# Patient Record
Sex: Female | Born: 1960
Health system: Southern US, Community
[De-identification: ages and names within clinical notes are randomized; demographics above are authoritative.]

## PROBLEM LIST (undated history)

## (undated) DIAGNOSIS — K631 Perforation of intestine (nontraumatic): Secondary | ICD-10-CM

## (undated) DIAGNOSIS — F419 Anxiety disorder, unspecified: Secondary | ICD-10-CM

## (undated) DIAGNOSIS — D352 Benign neoplasm of pituitary gland: Secondary | ICD-10-CM

## (undated) DIAGNOSIS — K219 Gastro-esophageal reflux disease without esophagitis: Secondary | ICD-10-CM

## (undated) DIAGNOSIS — K08109 Complete loss of teeth, unspecified cause, unspecified class: Secondary | ICD-10-CM

## (undated) DIAGNOSIS — E039 Hypothyroidism, unspecified: Secondary | ICD-10-CM

## (undated) DIAGNOSIS — Z973 Presence of spectacles and contact lenses: Secondary | ICD-10-CM

## (undated) DIAGNOSIS — Z8719 Personal history of other diseases of the digestive system: Secondary | ICD-10-CM

## (undated) DIAGNOSIS — M1990 Unspecified osteoarthritis, unspecified site: Secondary | ICD-10-CM

## (undated) DIAGNOSIS — R519 Headache, unspecified: Secondary | ICD-10-CM

## (undated) DIAGNOSIS — K56609 Unspecified intestinal obstruction, unspecified as to partial versus complete obstruction: Secondary | ICD-10-CM

## (undated) DIAGNOSIS — Z87442 Personal history of urinary calculi: Secondary | ICD-10-CM

## (undated) DIAGNOSIS — F32A Depression, unspecified: Secondary | ICD-10-CM

## (undated) DIAGNOSIS — K5900 Constipation, unspecified: Secondary | ICD-10-CM

## (undated) DIAGNOSIS — N12 Tubulo-interstitial nephritis, not specified as acute or chronic: Secondary | ICD-10-CM

## (undated) HISTORY — PX: CHOLECYSTECTOMY: SHX55

## (undated) HISTORY — PX: BILATERAL CARPAL TUNNEL RELEASE: SHX6508

## (undated) HISTORY — PX: THYROIDECTOMY: SHX17

## (undated) HISTORY — PX: TOTAL ABDOMINAL HYSTERECTOMY: SHX209

## (undated) HISTORY — PX: OTHER SURGICAL HISTORY: SHX169

## (undated) HISTORY — PX: TUBAL LIGATION: SHX77

## (undated) HISTORY — PX: LAPAROSCOPIC LYSIS OF ADHESIONS: SHX5905

---

## 1979-03-03 HISTORY — PX: PITUITARY SURGERY: SHX203

## 2014-03-02 HISTORY — PX: LAPAROSCOPIC GASTRIC SLEEVE RESECTION: SHX5895

## 2019-03-03 DIAGNOSIS — N200 Calculus of kidney: Secondary | ICD-10-CM

## 2019-03-03 HISTORY — DX: Calculus of kidney: N20.0

## 2020-01-30 DIAGNOSIS — E890 Postprocedural hypothyroidism: Secondary | ICD-10-CM | POA: Diagnosis not present

## 2020-01-30 DIAGNOSIS — G4700 Insomnia, unspecified: Secondary | ICD-10-CM | POA: Diagnosis not present

## 2020-01-30 DIAGNOSIS — F419 Anxiety disorder, unspecified: Secondary | ICD-10-CM | POA: Diagnosis not present

## 2020-01-30 DIAGNOSIS — F32A Depression, unspecified: Secondary | ICD-10-CM | POA: Diagnosis not present

## 2020-03-22 DIAGNOSIS — E890 Postprocedural hypothyroidism: Secondary | ICD-10-CM | POA: Diagnosis not present

## 2020-03-22 DIAGNOSIS — F419 Anxiety disorder, unspecified: Secondary | ICD-10-CM | POA: Diagnosis not present

## 2020-03-22 DIAGNOSIS — F3341 Major depressive disorder, recurrent, in partial remission: Secondary | ICD-10-CM | POA: Diagnosis not present

## 2020-03-22 DIAGNOSIS — F5104 Psychophysiologic insomnia: Secondary | ICD-10-CM | POA: Diagnosis not present

## 2020-06-19 DIAGNOSIS — L989 Disorder of the skin and subcutaneous tissue, unspecified: Secondary | ICD-10-CM | POA: Diagnosis not present

## 2020-06-19 DIAGNOSIS — Z86018 Personal history of other benign neoplasm: Secondary | ICD-10-CM | POA: Diagnosis not present

## 2020-06-19 DIAGNOSIS — Z1159 Encounter for screening for other viral diseases: Secondary | ICD-10-CM | POA: Diagnosis not present

## 2020-06-19 DIAGNOSIS — Z0000 Encounter for general adult medical examination without abnormal findings: Secondary | ICD-10-CM | POA: Diagnosis not present

## 2020-06-19 DIAGNOSIS — M810 Age-related osteoporosis without current pathological fracture: Secondary | ICD-10-CM | POA: Diagnosis not present

## 2020-06-24 ENCOUNTER — Other Ambulatory Visit: Payer: Self-pay | Admitting: Family Medicine

## 2020-06-24 DIAGNOSIS — M810 Age-related osteoporosis without current pathological fracture: Secondary | ICD-10-CM

## 2020-06-24 DIAGNOSIS — Z1231 Encounter for screening mammogram for malignant neoplasm of breast: Secondary | ICD-10-CM

## 2020-07-10 DIAGNOSIS — E538 Deficiency of other specified B group vitamins: Secondary | ICD-10-CM | POA: Diagnosis not present

## 2020-07-17 DIAGNOSIS — E538 Deficiency of other specified B group vitamins: Secondary | ICD-10-CM | POA: Diagnosis not present

## 2020-07-24 DIAGNOSIS — E538 Deficiency of other specified B group vitamins: Secondary | ICD-10-CM | POA: Diagnosis not present

## 2020-07-31 DIAGNOSIS — E538 Deficiency of other specified B group vitamins: Secondary | ICD-10-CM | POA: Diagnosis not present

## 2020-10-08 DIAGNOSIS — D485 Neoplasm of uncertain behavior of skin: Secondary | ICD-10-CM | POA: Diagnosis not present

## 2020-10-08 DIAGNOSIS — D225 Melanocytic nevi of trunk: Secondary | ICD-10-CM | POA: Diagnosis not present

## 2020-10-08 DIAGNOSIS — L570 Actinic keratosis: Secondary | ICD-10-CM | POA: Diagnosis not present

## 2020-10-08 DIAGNOSIS — L821 Other seborrheic keratosis: Secondary | ICD-10-CM | POA: Diagnosis not present

## 2020-11-21 ENCOUNTER — Other Ambulatory Visit: Payer: Self-pay | Admitting: Physician Assistant

## 2020-11-21 DIAGNOSIS — K5904 Chronic idiopathic constipation: Secondary | ICD-10-CM | POA: Diagnosis not present

## 2020-11-21 DIAGNOSIS — Z86010 Personal history of colonic polyps: Secondary | ICD-10-CM | POA: Diagnosis not present

## 2020-11-21 DIAGNOSIS — R1319 Other dysphagia: Secondary | ICD-10-CM

## 2020-11-21 DIAGNOSIS — K219 Gastro-esophageal reflux disease without esophagitis: Secondary | ICD-10-CM | POA: Diagnosis not present

## 2020-11-25 ENCOUNTER — Ambulatory Visit
Admission: RE | Admit: 2020-11-25 | Discharge: 2020-11-25 | Disposition: A | Discharge status: Home / Self Care | Payer: Medicare Other | Source: Ambulatory Visit | Attending: Physician Assistant | Admitting: Physician Assistant

## 2020-11-25 DIAGNOSIS — R1319 Other dysphagia: Secondary | ICD-10-CM

## 2020-11-25 DIAGNOSIS — K224 Dyskinesia of esophagus: Secondary | ICD-10-CM | POA: Diagnosis not present

## 2020-12-03 ENCOUNTER — Other Ambulatory Visit (HOSPITAL_COMMUNITY): Payer: Self-pay | Admitting: Physician Assistant

## 2020-12-03 DIAGNOSIS — K219 Gastro-esophageal reflux disease without esophagitis: Secondary | ICD-10-CM

## 2020-12-03 DIAGNOSIS — R1319 Other dysphagia: Secondary | ICD-10-CM

## 2020-12-03 DIAGNOSIS — K5904 Chronic idiopathic constipation: Secondary | ICD-10-CM

## 2020-12-10 ENCOUNTER — Ambulatory Visit
Admission: RE | Admit: 2020-12-10 | Discharge: 2020-12-10 | Disposition: A | Discharge status: Home / Self Care | Payer: Medicare Other | Source: Ambulatory Visit | Attending: Family Medicine | Admitting: Family Medicine

## 2020-12-10 ENCOUNTER — Other Ambulatory Visit: Payer: Self-pay

## 2020-12-10 DIAGNOSIS — M810 Age-related osteoporosis without current pathological fracture: Secondary | ICD-10-CM | POA: Diagnosis not present

## 2020-12-10 DIAGNOSIS — Z1231 Encounter for screening mammogram for malignant neoplasm of breast: Secondary | ICD-10-CM | POA: Diagnosis not present

## 2020-12-10 DIAGNOSIS — M8588 Other specified disorders of bone density and structure, other site: Secondary | ICD-10-CM | POA: Diagnosis not present

## 2020-12-12 ENCOUNTER — Ambulatory Visit (HOSPITAL_COMMUNITY)
Admission: RE | Admit: 2020-12-12 | Discharge: 2020-12-12 | Disposition: A | Discharge status: Home / Self Care | Payer: Medicare Other | Source: Ambulatory Visit | Attending: Physician Assistant | Admitting: Physician Assistant

## 2020-12-12 ENCOUNTER — Encounter (HOSPITAL_COMMUNITY): Payer: Self-pay | Admitting: Radiology

## 2020-12-12 DIAGNOSIS — R1319 Other dysphagia: Secondary | ICD-10-CM | POA: Insufficient documentation

## 2020-12-12 DIAGNOSIS — K219 Gastro-esophageal reflux disease without esophagitis: Secondary | ICD-10-CM | POA: Insufficient documentation

## 2020-12-12 DIAGNOSIS — K5900 Constipation, unspecified: Secondary | ICD-10-CM | POA: Diagnosis not present

## 2020-12-12 DIAGNOSIS — K5904 Chronic idiopathic constipation: Secondary | ICD-10-CM | POA: Diagnosis not present

## 2020-12-12 DIAGNOSIS — R140 Abdominal distension (gaseous): Secondary | ICD-10-CM | POA: Diagnosis not present

## 2020-12-12 DIAGNOSIS — R110 Nausea: Secondary | ICD-10-CM | POA: Diagnosis not present

## 2020-12-12 DIAGNOSIS — R109 Unspecified abdominal pain: Secondary | ICD-10-CM | POA: Diagnosis not present

## 2020-12-12 MED ORDER — TECHNETIUM TC 99M SULFUR COLLOID
2.0000 | Freq: Once | INTRAVENOUS | Status: AC | PRN
  Administered 2020-12-12: 2 via INTRAVENOUS

## 2020-12-16 ENCOUNTER — Other Ambulatory Visit: Payer: Self-pay | Admitting: Family Medicine

## 2020-12-16 DIAGNOSIS — K2930 Chronic superficial gastritis without bleeding: Secondary | ICD-10-CM | POA: Diagnosis not present

## 2020-12-16 DIAGNOSIS — D124 Benign neoplasm of descending colon: Secondary | ICD-10-CM | POA: Diagnosis not present

## 2020-12-16 DIAGNOSIS — D122 Benign neoplasm of ascending colon: Secondary | ICD-10-CM | POA: Diagnosis not present

## 2020-12-16 DIAGNOSIS — R12 Heartburn: Secondary | ICD-10-CM | POA: Diagnosis not present

## 2020-12-16 DIAGNOSIS — K644 Residual hemorrhoidal skin tags: Secondary | ICD-10-CM | POA: Diagnosis not present

## 2020-12-16 DIAGNOSIS — K648 Other hemorrhoids: Secondary | ICD-10-CM | POA: Diagnosis not present

## 2020-12-16 DIAGNOSIS — R1310 Dysphagia, unspecified: Secondary | ICD-10-CM | POA: Diagnosis not present

## 2020-12-16 DIAGNOSIS — Z86010 Personal history of colonic polyps: Secondary | ICD-10-CM | POA: Diagnosis not present

## 2020-12-16 DIAGNOSIS — K5730 Diverticulosis of large intestine without perforation or abscess without bleeding: Secondary | ICD-10-CM | POA: Diagnosis not present

## 2020-12-16 DIAGNOSIS — D123 Benign neoplasm of transverse colon: Secondary | ICD-10-CM | POA: Diagnosis not present

## 2020-12-16 DIAGNOSIS — R928 Other abnormal and inconclusive findings on diagnostic imaging of breast: Secondary | ICD-10-CM

## 2020-12-16 DIAGNOSIS — Z9884 Bariatric surgery status: Secondary | ICD-10-CM | POA: Diagnosis not present

## 2020-12-16 DIAGNOSIS — K2980 Duodenitis without bleeding: Secondary | ICD-10-CM | POA: Diagnosis not present

## 2020-12-19 DIAGNOSIS — F3341 Major depressive disorder, recurrent, in partial remission: Secondary | ICD-10-CM | POA: Diagnosis not present

## 2020-12-19 DIAGNOSIS — F419 Anxiety disorder, unspecified: Secondary | ICD-10-CM | POA: Diagnosis not present

## 2020-12-19 DIAGNOSIS — Z23 Encounter for immunization: Secondary | ICD-10-CM | POA: Diagnosis not present

## 2020-12-19 DIAGNOSIS — E890 Postprocedural hypothyroidism: Secondary | ICD-10-CM | POA: Diagnosis not present

## 2020-12-20 DIAGNOSIS — K2980 Duodenitis without bleeding: Secondary | ICD-10-CM | POA: Diagnosis not present

## 2020-12-20 DIAGNOSIS — D123 Benign neoplasm of transverse colon: Secondary | ICD-10-CM | POA: Diagnosis not present

## 2020-12-20 DIAGNOSIS — D122 Benign neoplasm of ascending colon: Secondary | ICD-10-CM | POA: Diagnosis not present

## 2020-12-20 DIAGNOSIS — K2930 Chronic superficial gastritis without bleeding: Secondary | ICD-10-CM | POA: Diagnosis not present

## 2021-01-01 ENCOUNTER — Other Ambulatory Visit: Payer: Self-pay | Admitting: Family Medicine

## 2021-01-01 ENCOUNTER — Other Ambulatory Visit: Payer: Self-pay

## 2021-01-01 ENCOUNTER — Ambulatory Visit
Admission: RE | Admit: 2021-01-01 | Discharge: 2021-01-01 | Disposition: A | Discharge status: Home / Self Care | Payer: Medicare Other | Source: Ambulatory Visit | Attending: Family Medicine | Admitting: Family Medicine

## 2021-01-01 DIAGNOSIS — R928 Other abnormal and inconclusive findings on diagnostic imaging of breast: Secondary | ICD-10-CM | POA: Diagnosis not present

## 2021-01-01 DIAGNOSIS — N6489 Other specified disorders of breast: Secondary | ICD-10-CM | POA: Diagnosis not present

## 2021-01-29 DIAGNOSIS — Z9884 Bariatric surgery status: Secondary | ICD-10-CM | POA: Diagnosis not present

## 2021-01-29 DIAGNOSIS — K219 Gastro-esophageal reflux disease without esophagitis: Secondary | ICD-10-CM | POA: Diagnosis not present

## 2021-02-20 DIAGNOSIS — E890 Postprocedural hypothyroidism: Secondary | ICD-10-CM | POA: Diagnosis not present

## 2021-02-20 DIAGNOSIS — F5104 Psychophysiologic insomnia: Secondary | ICD-10-CM | POA: Diagnosis not present

## 2021-02-20 DIAGNOSIS — F3341 Major depressive disorder, recurrent, in partial remission: Secondary | ICD-10-CM | POA: Diagnosis not present

## 2021-02-20 DIAGNOSIS — F419 Anxiety disorder, unspecified: Secondary | ICD-10-CM | POA: Diagnosis not present

## 2021-03-05 DIAGNOSIS — K219 Gastro-esophageal reflux disease without esophagitis: Secondary | ICD-10-CM | POA: Diagnosis not present

## 2021-03-10 DIAGNOSIS — H524 Presbyopia: Secondary | ICD-10-CM | POA: Diagnosis not present

## 2021-04-03 DIAGNOSIS — F410 Panic disorder [episodic paroxysmal anxiety] without agoraphobia: Secondary | ICD-10-CM | POA: Diagnosis not present

## 2021-04-03 DIAGNOSIS — F3340 Major depressive disorder, recurrent, in remission, unspecified: Secondary | ICD-10-CM | POA: Diagnosis not present

## 2021-04-03 DIAGNOSIS — F411 Generalized anxiety disorder: Secondary | ICD-10-CM | POA: Diagnosis not present

## 2021-04-07 DIAGNOSIS — F411 Generalized anxiety disorder: Secondary | ICD-10-CM | POA: Diagnosis not present

## 2021-04-07 DIAGNOSIS — F410 Panic disorder [episodic paroxysmal anxiety] without agoraphobia: Secondary | ICD-10-CM | POA: Diagnosis not present

## 2021-04-07 DIAGNOSIS — F3340 Major depressive disorder, recurrent, in remission, unspecified: Secondary | ICD-10-CM | POA: Diagnosis not present

## 2021-04-29 DIAGNOSIS — F410 Panic disorder [episodic paroxysmal anxiety] without agoraphobia: Secondary | ICD-10-CM | POA: Diagnosis not present

## 2021-04-29 DIAGNOSIS — F3340 Major depressive disorder, recurrent, in remission, unspecified: Secondary | ICD-10-CM | POA: Diagnosis not present

## 2021-04-29 DIAGNOSIS — F411 Generalized anxiety disorder: Secondary | ICD-10-CM | POA: Diagnosis not present

## 2021-05-01 DIAGNOSIS — F411 Generalized anxiety disorder: Secondary | ICD-10-CM | POA: Diagnosis not present

## 2021-05-01 DIAGNOSIS — F3340 Major depressive disorder, recurrent, in remission, unspecified: Secondary | ICD-10-CM | POA: Diagnosis not present

## 2021-05-01 DIAGNOSIS — F410 Panic disorder [episodic paroxysmal anxiety] without agoraphobia: Secondary | ICD-10-CM | POA: Diagnosis not present

## 2021-05-14 DIAGNOSIS — F411 Generalized anxiety disorder: Secondary | ICD-10-CM | POA: Diagnosis not present

## 2021-05-14 DIAGNOSIS — F3340 Major depressive disorder, recurrent, in remission, unspecified: Secondary | ICD-10-CM | POA: Diagnosis not present

## 2021-05-20 DIAGNOSIS — F3340 Major depressive disorder, recurrent, in remission, unspecified: Secondary | ICD-10-CM | POA: Diagnosis not present

## 2021-05-20 DIAGNOSIS — F411 Generalized anxiety disorder: Secondary | ICD-10-CM | POA: Diagnosis not present

## 2021-05-20 DIAGNOSIS — F410 Panic disorder [episodic paroxysmal anxiety] without agoraphobia: Secondary | ICD-10-CM | POA: Diagnosis not present

## 2021-05-28 DIAGNOSIS — F411 Generalized anxiety disorder: Secondary | ICD-10-CM | POA: Diagnosis not present

## 2021-05-28 DIAGNOSIS — F3340 Major depressive disorder, recurrent, in remission, unspecified: Secondary | ICD-10-CM | POA: Diagnosis not present

## 2021-05-28 DIAGNOSIS — F410 Panic disorder [episodic paroxysmal anxiety] without agoraphobia: Secondary | ICD-10-CM | POA: Diagnosis not present

## 2021-06-16 DIAGNOSIS — F411 Generalized anxiety disorder: Secondary | ICD-10-CM | POA: Diagnosis not present

## 2021-06-16 DIAGNOSIS — F3340 Major depressive disorder, recurrent, in remission, unspecified: Secondary | ICD-10-CM | POA: Diagnosis not present

## 2021-06-16 DIAGNOSIS — F410 Panic disorder [episodic paroxysmal anxiety] without agoraphobia: Secondary | ICD-10-CM | POA: Diagnosis not present

## 2021-06-27 DIAGNOSIS — Z79899 Other long term (current) drug therapy: Secondary | ICD-10-CM | POA: Diagnosis not present

## 2021-06-27 DIAGNOSIS — Z90710 Acquired absence of both cervix and uterus: Secondary | ICD-10-CM | POA: Diagnosis not present

## 2021-06-27 DIAGNOSIS — E538 Deficiency of other specified B group vitamins: Secondary | ICD-10-CM | POA: Diagnosis not present

## 2021-06-27 DIAGNOSIS — Z90721 Acquired absence of ovaries, unilateral: Secondary | ICD-10-CM | POA: Diagnosis not present

## 2021-06-27 DIAGNOSIS — Z0000 Encounter for general adult medical examination without abnormal findings: Secondary | ICD-10-CM | POA: Diagnosis not present

## 2021-06-27 DIAGNOSIS — E890 Postprocedural hypothyroidism: Secondary | ICD-10-CM | POA: Diagnosis not present

## 2021-06-27 DIAGNOSIS — E7800 Pure hypercholesterolemia, unspecified: Secondary | ICD-10-CM | POA: Diagnosis not present

## 2021-07-03 ENCOUNTER — Other Ambulatory Visit: Payer: Self-pay | Admitting: Gastroenterology

## 2021-07-03 DIAGNOSIS — K5900 Constipation, unspecified: Secondary | ICD-10-CM | POA: Diagnosis not present

## 2021-07-03 DIAGNOSIS — K219 Gastro-esophageal reflux disease without esophagitis: Secondary | ICD-10-CM | POA: Diagnosis not present

## 2021-07-03 DIAGNOSIS — K224 Dyskinesia of esophagus: Secondary | ICD-10-CM | POA: Diagnosis not present

## 2021-07-03 DIAGNOSIS — K589 Irritable bowel syndrome without diarrhea: Secondary | ICD-10-CM | POA: Diagnosis not present

## 2021-07-07 DIAGNOSIS — F410 Panic disorder [episodic paroxysmal anxiety] without agoraphobia: Secondary | ICD-10-CM | POA: Diagnosis not present

## 2021-07-07 DIAGNOSIS — F3340 Major depressive disorder, recurrent, in remission, unspecified: Secondary | ICD-10-CM | POA: Diagnosis not present

## 2021-07-07 DIAGNOSIS — F411 Generalized anxiety disorder: Secondary | ICD-10-CM | POA: Diagnosis not present

## 2021-07-18 DIAGNOSIS — F3340 Major depressive disorder, recurrent, in remission, unspecified: Secondary | ICD-10-CM | POA: Diagnosis not present

## 2021-07-18 DIAGNOSIS — F411 Generalized anxiety disorder: Secondary | ICD-10-CM | POA: Diagnosis not present

## 2021-07-18 DIAGNOSIS — F410 Panic disorder [episodic paroxysmal anxiety] without agoraphobia: Secondary | ICD-10-CM | POA: Diagnosis not present

## 2021-07-21 DIAGNOSIS — F3340 Major depressive disorder, recurrent, in remission, unspecified: Secondary | ICD-10-CM | POA: Diagnosis not present

## 2021-07-21 DIAGNOSIS — F411 Generalized anxiety disorder: Secondary | ICD-10-CM | POA: Diagnosis not present

## 2021-07-21 DIAGNOSIS — F410 Panic disorder [episodic paroxysmal anxiety] without agoraphobia: Secondary | ICD-10-CM | POA: Diagnosis not present

## 2021-08-11 DIAGNOSIS — F411 Generalized anxiety disorder: Secondary | ICD-10-CM | POA: Diagnosis not present

## 2021-08-11 DIAGNOSIS — F3340 Major depressive disorder, recurrent, in remission, unspecified: Secondary | ICD-10-CM | POA: Diagnosis not present

## 2021-08-11 DIAGNOSIS — F410 Panic disorder [episodic paroxysmal anxiety] without agoraphobia: Secondary | ICD-10-CM | POA: Diagnosis not present

## 2021-08-22 DIAGNOSIS — J0100 Acute maxillary sinusitis, unspecified: Secondary | ICD-10-CM | POA: Diagnosis not present

## 2021-08-22 DIAGNOSIS — J069 Acute upper respiratory infection, unspecified: Secondary | ICD-10-CM | POA: Diagnosis not present

## 2021-08-26 DIAGNOSIS — K5904 Chronic idiopathic constipation: Secondary | ICD-10-CM | POA: Diagnosis not present

## 2021-08-26 DIAGNOSIS — K589 Irritable bowel syndrome without diarrhea: Secondary | ICD-10-CM | POA: Diagnosis not present

## 2021-08-27 DIAGNOSIS — F410 Panic disorder [episodic paroxysmal anxiety] without agoraphobia: Secondary | ICD-10-CM | POA: Diagnosis not present

## 2021-08-27 DIAGNOSIS — F411 Generalized anxiety disorder: Secondary | ICD-10-CM | POA: Diagnosis not present

## 2021-08-27 DIAGNOSIS — F3340 Major depressive disorder, recurrent, in remission, unspecified: Secondary | ICD-10-CM | POA: Diagnosis not present

## 2021-09-11 DIAGNOSIS — F411 Generalized anxiety disorder: Secondary | ICD-10-CM | POA: Diagnosis not present

## 2021-09-11 DIAGNOSIS — F3340 Major depressive disorder, recurrent, in remission, unspecified: Secondary | ICD-10-CM | POA: Diagnosis not present

## 2021-10-13 ENCOUNTER — Encounter: Payer: Self-pay | Admitting: Behavioral Health

## 2021-10-13 ENCOUNTER — Ambulatory Visit (INDEPENDENT_AMBULATORY_CARE_PROVIDER_SITE_OTHER): Payer: Medicare Other | Admitting: Behavioral Health

## 2021-10-13 VITALS — BP 134/87 | HR 77 | Ht 59.0 in | Wt 117.0 lb

## 2021-10-13 DIAGNOSIS — F5105 Insomnia due to other mental disorder: Secondary | ICD-10-CM

## 2021-10-13 DIAGNOSIS — F331 Major depressive disorder, recurrent, moderate: Secondary | ICD-10-CM

## 2021-10-13 DIAGNOSIS — F411 Generalized anxiety disorder: Secondary | ICD-10-CM

## 2021-10-13 DIAGNOSIS — F99 Mental disorder, not otherwise specified: Secondary | ICD-10-CM | POA: Diagnosis not present

## 2021-10-13 MED ORDER — FLUOXETINE HCL 20 MG PO CAPS: 80.0000 mg | ORAL_CAPSULE | Freq: Every day | ORAL | 1 refills | Status: DC

## 2021-10-13 MED ORDER — QUETIAPINE FUMARATE 25 MG PO TABS: 50.0000 mg | ORAL_TABLET | Freq: Every evening | ORAL | 1 refills | Status: DC

## 2021-10-13 MED ORDER — TEMAZEPAM 30 MG PO CAPS: 30.0000 mg | ORAL_CAPSULE | Freq: Every day | ORAL | 1 refills | Status: DC

## 2021-10-13 MED ORDER — ALPRAZOLAM 0.5 MG PO TABS: 0.5000 mg | ORAL_TABLET | Freq: Two times a day (BID) | ORAL | 1 refills | Status: DC | PRN

## 2021-10-13 NOTE — Progress Notes (Signed)
[facility] MD/PA/NP Initial Note  10/13/2021 5:32 PM Valerie [name]  MRN:  [id number]  Chief Complaint:  Chief Complaint   Anxiety; Depression; Medication Problem; Medication Refill; Establish Care; Patient Education     HPI:   "Valerie [name]", 61 year old female presents to this office for initial visit and to establish care.  She is accompanied by her husband Reggie with her verbal consent.  She says that she has struggled with anxiety and depression for over 20 years.  She is looking for a new provider to manage her medications.  She said that she was being candid with me, but she was dismissed from [facility] over how certain Rx medications were handled and protocol.  She said she and her husband moved to the area from [place] to be closer to their son who is working in the area.  She is currently living in [place] in an apartment and says that she has having a difficult time adapting to the area.  [place]  Says that she and her husband and rest the family have had a very difficult life dealing with serious disease and illness to include cancer.  She currently struggles with irritable bowel syndrome.  She said that her current medication regimen is currently working well for her and she does not want to change at this time.  She endorses frequent irritability and talkativeness.  She says that her anxiety today is 6 of 10, and her depression is 3 of 10.  She is sleeping 7 8 hours every night with the aid of Restoril.  She denies any history of mania, no psychosis, no history of auditory or visual hallucinations.  She denies SI or HI.  Prior psychiatric medication trial: Prozac Xanax Temazepam    Visit Diagnosis:    ICD-10-CM   1. Generalized anxiety disorder  F41.1 ALPRAZolam (XANAX) 0.5 MG tablet    FLUoxetine (PROZAC) 20 MG capsule    QUEtiapine (SEROQUEL) 25 MG tablet    2. Major depressive disorder, recurrent episode, moderate (HCC)  F33.1  FLUoxetine (PROZAC) 20 MG capsule    3. Insomnia due to other mental disorder  F51.05 temazepam (RESTORIL) 30 MG capsule   F99 QUEtiapine (SEROQUEL) 25 MG tablet      Past Psychiatric History: Anxiety, MDD  Past Medical History: History reviewed. No pertinent past medical history. History reviewed. No pertinent surgical history.  Family Psychiatric History: none noted. Will provide next visit  Family History: History reviewed. No pertinent family history.  Social History:  Social History   Socioeconomic History   Marital status: Married    Spouse name: Not on file   Number of children: Not on file   Years of education: Not on file   Highest education level: Not on file  Occupational History   Not on file  Tobacco Use   Smoking status: Not on file   Smokeless tobacco: Not on file  Substance and Sexual Activity   Alcohol use: Not on file   Drug use: Not on file   Sexual activity: Not on file  Other Topics Concern   Not on file  Social History Narrative   Not on file   Social Determinants of Health   Financial Resource Strain: Not on file  Food Insecurity: Not on file  Transportation Needs: Not on file  Physical Activity: Not on file  Stress: Not on file  Social Connections: Not on file    Allergies: No Known Allergies  Metabolic Disorder Labs:  No results found for: "HGBA1C", "MPG" No results found for: "PROLACTIN" No results found for: "CHOL", "TRIG", "HDL", "CHOLHDL", "VLDL", "LDLCALC" No results found for: "TSH"  Therapeutic Level Labs: No results found for: "LITHIUM" No results found for: "VALPROATE" No results found for: "CBMZ"  Current Medications: Current Outpatient Medications  Medication Sig Dispense Refill   ALPRAZolam (XANAX) 0.5 MG tablet Take 1 tablet (0.5 mg total) by mouth 2 (two) times daily as needed. 180 tablet 1   FLUoxetine (PROZAC) 20 MG capsule Take 4 capsules (80 mg total) by mouth daily. 120 capsule 1   levothyroxine (SYNTHROID)  75 MCG tablet Take by mouth.     LINZESS 290 MCG CAPS capsule Take 290 mcg by mouth every morning.     omeprazole (PRILOSEC) 40 MG capsule Take 40 mg by mouth every morning.     QUEtiapine (SEROQUEL) 25 MG tablet Take 2 tablets (50 mg total) by mouth at bedtime. 180 tablet 1   temazepam (RESTORIL) 30 MG capsule Take 1 capsule (30 mg total) by mouth at bedtime. 90 capsule 1   No current facility-administered medications for this visit.    Medication Side Effects: none  Orders placed this visit:  No orders of the defined types were placed in this encounter.   Psychiatric Specialty Exam:  Review of Systems  HENT:  Positive for trouble swallowing.   Gastrointestinal:  Positive for constipation and diarrhea.  Neurological:  Positive for weakness.    There were no vitals taken for this visit.There is no height or weight on file to calculate BMI.  General Appearance: Casual, Neat, and Well Groomed  Eye Contact:  Good  Speech:  Clear and Coherent and Talkative  Volume:  Normal  Mood:  Anxious and Depressed  Affect:  Depressed and Anxious  Thought Process:  Coherent  Orientation:  Full (Time, Place, and Person)  Thought Content: Logical   Suicidal Thoughts:  No  Homicidal Thoughts:  No  Memory:  WNL  Judgement:  Good  Insight:  Good  Psychomotor Activity:  Normal  Concentration:  Concentration: Good  Recall:  Good  Fund of Knowledge: Good  Language: Good  Assets:  Desire for Improvement  ADL's:  Intact  Cognition: WNL  Prognosis:  Good   Screenings:  PHQ2-9    Flowsheet Row Office Visit from 10/13/2021 in [facility]  PHQ-2 Total Score 1       Receiving Psychotherapy: No   Treatment Plan/Recommendations: Greater than 50% of  60 min face to face time with patient was spent on counseling and coordination of care. We discussed her long hx of anxiety and depression stemming back over 20 years. We talked about her previous tx and poc. We discussed  medications and her long term bz use. With her being on medication so long I feel that 1 mg per day is reasonable amount without additional risk a this time.  I reinforced that I did not feel comfortable increasing this amount nor would I ever approve early fills. She verbally acknowledged and understood. No other medication changes were necessary at this time.  We agreed to; Will continue Xanax 0.5 mg twice daily as needed for severe anxiety To continue Prozac 80 mg daily.  Patient is taking four 20 mg tablets To continue Seroquel 50 mg at bedtime.  Patient is taking two 25 mg tablets at once. To continue temazepam 30 mg capsule at bedtime for sleep. We will report side effects or worsening symptoms promptly Provided emergency contact information  We will follow-up in 3 months as required to continue medications. Discussed potential benefits, risk, and side effects of benzodiazepines to include potential risk of tolerance and dependence, as well as possible drowsiness.  Advised patient not to drive if experiencing drowsiness and to take lowest possible effective dose to minimize risk of dependence and tolerance.  Discussed potential metabolic side effects associated with atypical antipsychotics, as well as potential risk for movement side effects. Advised pt to contact office if movement side effects occur.   Reviewed PDMP    Joan Flores, NP

## 2021-10-14 ENCOUNTER — Ambulatory Visit: Payer: Medicare Other | Admitting: Adult Health

## 2021-10-16 ENCOUNTER — Ambulatory Visit: Payer: Medicare Other | Admitting: Psychiatry

## 2021-10-16 DIAGNOSIS — F411 Generalized anxiety disorder: Secondary | ICD-10-CM | POA: Diagnosis not present

## 2021-10-16 NOTE — Progress Notes (Signed)
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." "Anxiety produces too much uncertainty not know things for certain."  Mental Status Exam:    Appearance:   Casual     Behavior:  Appropriate, Sharing, and Motivated  Motor:  Normal  Speech/Language:   Clear and Coherent  Affect:  anxious  Mood:  anxious  Thought process:  goal directed  Thought content:    Obsessions  Sensory/Perceptual disturbances:    WNL  Orientation:  oriented to person, place, time/date, situation, day of week, month of year, year, and stated date of October 16, 2021  Attention:  Good  Concentration:  Good  Memory:  WNL  Fund of knowledge:   Good  Insight:    Good  Judgment:   Good  Impulse Control:  Good and Fair   Reported Symptoms:  see above symptoms  Risk Assessment: Danger to Self:  No Self-injurious Behavior: No Danger to Others: No Duty to Warn:no Physical Aggression / Violence:No  Access to Firearms a concern: No  Gang Involvement:No  Patient / guardian was educated about steps to take if suicide or homicide risk level increases between visits: Denies any SI. While future psychiatric events cannot be accurately predicted, the patient does not currently require acute inpatient psychiatric care and does not currently meet [facility] involuntary commitment criteria.  Substance Abuse History: Current substance abuse: No     Past Psychiatric History:   Previous psychological history is significant for anxiety and depression Outpatient Providers:[facility]. "Other before that" History of Psych Hospitalization: No  Psychological Testing:  n/a    Abuse History: Victim of  No.,  n/a    Report needed: No. Victim of Neglect:No. Perpetrator of  n/a   Witness / Exposure to Domestic Violence: No   Protective Services Involvement: No  Witness to [employer] Violence:  No   Family History:  Father is deceased, mom is still living. Has 2 sisters (living in [place] and [place]) both of which  are "smarter and more financially stable than I am." "I was the one who is rebellious, they are compliant." "Not close to either of them."  Living situation: the patient lives with their spouse, married for 43 yrs. She is retired and husband works Armed forces operational officer.  Has 2 adult kids, ages 31 and 47, both males State she is close to older son but not so much with younger son, with 1 living very close to patient and the other one is in [place]. Has 1 grandson age 3 in [place], and a grand-daughter due within a few days here locally. Dad is deceased and mom  who lives in [place] is still living but is not close to patient nor supportive   Sexual Orientation:  Straight  Relationship Status: married  Name of spouse / other:n/a             If a parent, number of children / ages:see above  Support Systems; spouse friends  Financial Stress:   not really but it would be nice if we had more savings  Income/Employment/Disability: Social Security Disability and husband's wages  Financial planner: No   Educational History: Education: Risk manager:   Protestant  Any  cultural differences that may affect / interfere with treatment:  not applicable   Recreation/Hobbies: crafting  Stressors: son with whom she doesn't get along with; "I HAVE TO BE RIGHT, Don't tell me I'm wrong , keeping up appearances at all times, strive for perfection."  Strengths:  Supportive Relationships, Family, Friends, Spirituality, Hopefulness, Self Advocate, and Able to Communicate Effectively  Barriers:  "my family, they just won't see the better even if I accomplish it. They would only  see the past me."    Legal History: Pending legal issue / charges: The patient has no significant history of legal issues. History of legal issue / charges:  n/a  Medical History/Surgical History:None on file to review.  No past medical history on file. Approx. 1981, had brain tumor at base of brain removed.  "Has 5 small bowel obstruction and removing part of intestines."  2 C-section. Gastric-bypass sleeve surgery 8-9 yrs ago.  No past surgical history on file.  Medications: Reviewed with patient and she confirms info below.  Current Outpatient Medications  Medication Sig Dispense Refill   ALPRAZolam (XANAX) 0.5 MG tablet Take 1 tablet (0.5 mg total) by mouth 2 (two) times daily as needed. 180 tablet 1   FLUoxetine (PROZAC) 20 MG capsule Take 4 capsules (80 mg total) by mouth daily. 120 capsule 1   levothyroxine (SYNTHROID) 75 MCG tablet Take by mouth.     LINZESS 290 MCG CAPS capsule Take 290 mcg by mouth every morning.     omeprazole (PRILOSEC) 40 MG capsule Take 40 mg by mouth every morning.     QUEtiapine (SEROQUEL) 25 MG tablet Take 2 tablets (50 mg total) by mouth at bedtime. 180 tablet 1   temazepam (RESTORIL) 30 MG capsule Take 1 capsule (30 mg total) by mouth at bedtime. 90 capsule 1   No current facility-administered medications for this visit.    No Known Allergies Reports today that she has allergies to : Pennicillen, oxycontin  Diagnoses:    ICD-10-CM   1. Generalized anxiety disorder  F41.1      Treatment goal plan: Patient not signing treatment goal plan on computer screen due to Covid. Treatment goals: Treatment goals remain on treatment plan as patient works with strategies to achieve her goals. Progress is assessed each session and documented in "subject" and/or "plan" sections of treatment plan. Long-term goal: Increase understanding of beliefs and messages that produce the worry and anxiety. Short-term goal: Reduce overall level, frequency, and intensity  of the anxiety so that daily functioning is not impaired. Strategies: Patient to learn further about anxiety re: daily skills for managing it, and to use the skills effectively in everyday life.    Plan of Care: Today is first visit for this patient and we completed her initial evaluation for therapy and also her initial treatment goal plan.  Jakeia is a 61 year old, married, mother of 2 sons ages 16 and 42, 1 of whom lives about a mile from patient and the other living in [place].  She has one grandson, age 44, living in [place] and one granddaughter on the way and do within the next few days here locally.  Patient lives with her spouse and they have been married for 43 years.  She is retired and husband is retired also but works Armed forces operational officer.  States that her spouse is supportive of her along with a couple of friends.  Patient's father is deceased and her mother lives in [place].  Patient reports she and her  mother "are not close at all".  Patient has 2 sisters, 1 living in [place] and the other living in [place], and both of them are "smarter and more financially stable than I am".  Patient adds "I was the one who was rebellious, they were compliant" further adds "I was not close to either of them".  She reports little financial stress but "it would be nice if we have more savings".  Denies any current substance abuse.  Has some previous psychiatric history regarding anxiety and depression but no history of hospitalization for psychiatric purposes.  Patient denies any history of prior abuse.  Educationally, she is a Engineer, maintenance (IT).  Identifies as a Protestant in terms of religion.  States that her hobbies are crafting.  She reports her strengths to be supportive relationships (a couple), some family, a sense of spirituality, hopefulness, self advocate, and able to communicate effectively.  She reports her stressors to include: The son with whom I do not get along with, and "I have to be  right, do not tell me I am wrong, keeping up appearances all the time, and striving for perfection."  When asked about possible barriers to treatment, she stated "my family they just will not see the better even if I accomplish it, they would only see the past me."  As far as mental status exam, her appearance is casual, behavior is appropriate and motivated, motor skills are normal, speech and language is clear and coherent, affect and mood are anxious, thought process is goal directed, thought content includes obsessive thoughts, sensory/perceptual is WNL, she is well oriented to person/place/time/date/situation/day of week/month of year/year and stated date of October 16, 2021.  Her attention and concentration are good, memory is WNL, insight and judgment are good, and impulse control is "sometimes good and sometimes fair".  Found it hard to articulate her goals but eventually said that she mostly wants to work on her anxiety, questioning others and their behaviors, and "some OCD".  She seems to have some motivation for these goals and states that she is committed to working together to help her "move forward and decrease her anxiety".  For further information on this patient including medical, personal history, other family history, and risk assessment, please see the above sections of this full initial evaluation.  Patient is planning to schedule within the next 2 weeks to return.  Review of initial treatment goal plan and patient is in agreement.  Next appointment within 2 weeks.  This record has been created using [employer].  Chart creation errors have been sought, but may not always have been located and corrected.  Such creation errors do not reflect on the standard of medical care provided.   Mathis Fare, LCSW

## 2021-10-30 ENCOUNTER — Ambulatory Visit: Payer: Medicare Other | Admitting: Psychiatry

## 2021-10-30 DIAGNOSIS — F411 Generalized anxiety disorder: Secondary | ICD-10-CM

## 2021-10-30 NOTE — Progress Notes (Signed)
[facility] Counselor/Therapist Progress Note  Patient ID: Valerie [name], MRN: [id number],    Date: 10/30/2021  Time Spent: 55 minutes   Treatment Type: Individual Therapy  Reported Symptoms: anxiety, worry excessive  Mental Status Exam:  Appearance:   Neat     Behavior:  Appropriate, Sharing, and Motivated  Motor:  Normal  Speech/Language:   Clear and Coherent  Affect:  Anxious, worried  Mood:  anxious  Thought process:  goal directed  Thought content:    Rumination and overthinking  Sensory/Perceptual disturbances:    WNL  Orientation:  oriented to person, place, time/date, situation, day of week, month of year, year, and stated date of October 30, 2021  Attention:  Good  Concentration:  Good and Fair  Memory:  WNL  Fund of knowledge:   Good  Insight:    Good  Judgment:   Good  Impulse Control:  Good and Fair   Risk Assessment: Danger to Self:  No Self-injurious Behavior: No Danger to Others: No Duty to Warn:no Physical Aggression / Violence:No  Access to Firearms a concern: No  Gang Involvement:No   Subjective:  Patient in today reporting symptoms of "anxiety, overly questioning others, OCD, very poor boundaries that is heavily impacting her mood and outlook, and frustrated because anxiety produces too much uncertainty". Today states "I want to get some direction to not let others consume me as I am a people pleaser and I take on their problems." Needed session today to work on relationship with one of her long time friends that is unhealthy for patient. Worked on setting limits, clear boundaries, saying "no" when she needs to do so, and gaining more awareness in sessions as we discuss healthy friendships and the need to take care of her own self. Also caught up in some similar issues with her mother and sister that we processed today. Taught patient about healthy boundaries as this is a significant need for patient within her family and beyond.  Interventions:  Cognitive Behavioral Therapy and Ego-Supportive  Treatment goal plan: Patient not signing treatment goal plan on computer screen due to Covid. Treatment goals: Treatment goals remain on treatment plan as patient works with strategies to achieve her goals. Progress is assessed each session and documented in "subject" and/or "plan" sections of treatment plan. Long-term goal: Increase understanding of beliefs and messages that produce the worry and anxiety. Short-term goal: Reduce overall level, frequency, and intensity of the anxiety so that daily functioning is not impaired. Strategies: Patient to learn further about anxiety re: daily skills for managing it, and to use the skills effectively in everyday life.   Diagnosis:   ICD-10-CM   1. Generalized anxiety disorder  F41.1      Plan:  Patient in today showing motivation and participation in session as she focused on personal and family issues that involve very unhealthy boundaries. Very difficult to make changes and finds it hard to consider other behaviors and responses to family members (especially mom and sister).  Patient did however commit to homework assignment relating to unhealthy boundaries and family issues.  Is making some progress in terms of becoming a little more committed to actually trying to make some changes and believing that maybe she can, but also continues to need to be in therapy to build on that initial progress and follow goal directed behaviors to become less anxious, less controlled by other people, have healthier boundaries, and be more stable emotionally. Encouraged patient in the practice of  more positive behaviors including: Staying in the present focusing on what she can control or change, have more compassion for herself and others, understand that her thoughts are "just thoughts" and are not necessarily going to play out in reality, stay in touch with people who are supportive, look for more positives versus  negatives each day, healthy nutrition and exercise, refrain from assuming negatives and worst case scenarios, use of positive self talk, reduce overthinking and over analyzing, challenge and counteract any self doubt, and recognize the strength she shows working with goal directed behaviors to move in a direction that supports increased self-confidence and her improved emotional health.  Goal review and progress/challenges noted with patient.  Next appointment within 2 to 3 weeks.  This record has been created using [employer].  Chart creation errors have been sought, but may not always have been located and corrected.  Such creation errors do not reflect on the standard of medical care provided.   Mathis Fare, LCSW

## 2021-11-05 ENCOUNTER — Encounter (HOSPITAL_COMMUNITY): Payer: Self-pay

## 2021-11-05 ENCOUNTER — Ambulatory Visit (HOSPITAL_COMMUNITY): Admit: 2021-11-05 | Payer: Medicare Other | Admitting: Gastroenterology

## 2021-11-05 SURGERY — MANOMETRY, ANORECTAL

## 2021-11-07 ENCOUNTER — Ambulatory Visit: Payer: Medicare Other | Admitting: Psychiatry

## 2021-11-10 ENCOUNTER — Ambulatory Visit (INDEPENDENT_AMBULATORY_CARE_PROVIDER_SITE_OTHER): Payer: Medicare Other | Admitting: Psychiatry

## 2021-11-10 DIAGNOSIS — F411 Generalized anxiety disorder: Secondary | ICD-10-CM | POA: Diagnosis not present

## 2021-11-10 NOTE — Progress Notes (Signed)
[facility] Counselor/Therapist Progress Note  Patient ID: Valerie [name], MRN: [id number],    Date: 11/10/2021  Time Spent: 55 minutes   Treatment Type: Individual Therapy  Reported Symptoms: anxiety, self-doubt, motivated currently but it does change at times, some issues in relationshp with husband   Mental Status Exam:  Appearance:   Casual and Neat     Behavior:  Appropriate, Sharing, and Motivated  Motor:  Normal  Speech/Language:   Clear and Coherent  Affect:  anxiety  Mood:  anxious  Thought process:  goal directed  Thought content:    Rumination and overthinking  Sensory/Perceptual disturbances:    WNL  Orientation:  oriented to person, place, time/date, situation, day of week, month of year, year, and stated date of Sept. 11, 2023  Attention:  Good  Concentration:  Good  Memory:  WNL  Fund of knowledge:   Good  Insight:    Good and Fair  Judgment:   Good  Impulse Control:  Good   Risk Assessment: Danger to Self:  No Self-injurious Behavior: No Danger to Others: No Duty to Warn:no Physical Aggression / Violence:No  Access to Firearms a concern: No  Gang Involvement:No   Subjective:   Patient in today reporting anxiety, self-doubt, and currently motivated but "my motivation does change at times." "Being here makes me feel more at peace" but also acknowledging feelings outside of here that she has been dealing since last appt. These feelings related to her communication within her marriage which she reports can be quite volatile. Also processing some of her OCD thoughts and behaviors. Boundary issues continue with family and beyond in some friendships as she worked further today on her tendency to be a people-pleaser. Some difficulty in seeing some of her own behaviors that feed relationship issues within family and beyond. Feeling "less than" at times and very high expectations. "I feel that I feel through the cracks in my younger years including being promoted  through school and passed on to next grade level, but didn't really master the material. Lack of fulfillment in her life. Hard to see positives/possibilities. "Raised with negativity," and worked with this realization today in session, gradually trying to decide on a "positive" for herself.  Homework given to patient re:the impact negativity has had in her life and how she wants to turn things around now to include recognition of some positives, setting limits with the negative impact she allows other to be.  Interventions: Cognitive Behavioral Therapy and Ego-Supportive  Treatment goal plan: Patient not signing treatment goal plan on computer screen due to Covid. Treatment goals: Treatment goals remain on treatment plan as patient works with strategies to achieve her goals. Progress is assessed each session and documented in "subject" and/or "plan" sections of treatment plan. Long-term goal: Increase understanding of beliefs and messages that produce the worry and anxiety. Short-term goal: Reduce overall level, frequency, and intensity of the anxiety so that daily functioning is not impaired. Strategies: Patient to learn further about anxiety re: daily skills for managing it, and to use the skills effectively in everyday life.   Diagnosis:   ICD-10-CM   1. Generalized anxiety disorder  F41.1      Plan: Patient in today showing active participation and good motivation is session today focusing on family and personal issues including patient feeling hurt, mimimized, unappreciated, not acceptable. "But I feel at times I am more than what they see, and try to be more than this."  Especially difficult  feelings with mom and sister.Patient did however commit to homework assignment relating to unhealthy boundaries and family issues, and actually seemed more motivated by end of session as we talked about this.  Making some progress as she is committed to coming to therapy and also today actually allowed  herself to look at things in a little different perspectiv, which felt like progress for her.  Trying to focus more with her on what she can change versus cannot change and living in herself more to begin some changing, which could lead to her feeling less controlled by others and also less anxious. Encouraged patient in her practice of more positive behaviors including: Remaining in the present focusing on what she can control or change, have more compassion for herself and others, understand that her thoughts are "just thoughts" and not necessarily going to play out in reality, stay in touch with people who are supportive, look for more positives versus negatives each day, healthy nutrition and exercise, refrain from assuming negatives and worst case scenarios, use of positive self talk, reduce overthinking and over analyzing, challenge and counteract her self doubt, and realize the strength she shows working with goal directed behaviors to move in a direction that supports her improved emotional health.  Goal review and progress/challenges noted with patient.  Next appointment within 2 to 3 weeks.  This record has been created using [employer].  Chart creation errors have been sought, but may not always have been located and corrected.  Such creation errors do not reflect on the standard of medical care provided.   Mathis Fare, LCSW

## 2021-11-19 DIAGNOSIS — K219 Gastro-esophageal reflux disease without esophagitis: Secondary | ICD-10-CM | POA: Diagnosis not present

## 2021-11-19 DIAGNOSIS — K5904 Chronic idiopathic constipation: Secondary | ICD-10-CM | POA: Diagnosis not present

## 2021-11-19 DIAGNOSIS — K3184 Gastroparesis: Secondary | ICD-10-CM | POA: Diagnosis not present

## 2021-11-26 ENCOUNTER — Ambulatory Visit (INDEPENDENT_AMBULATORY_CARE_PROVIDER_SITE_OTHER): Payer: Medicare Other | Admitting: Psychiatry

## 2021-11-26 DIAGNOSIS — F411 Generalized anxiety disorder: Secondary | ICD-10-CM

## 2021-11-26 NOTE — Progress Notes (Signed)
[facility] Counselor/Therapist Progress Note  Patient ID: Valerie [name], MRN: [id number],    Date: 11/26/2021  Time Spent: 55 minutes   Treatment Type: Individual Therapy  Reported Symptoms:  anxiety, depression "and lack of enthusiasm sometimes"  Mental Status Exam:  Appearance:   Casual and Neat     Behavior:  Appropriate, Sharing, and Motivated (some times motivation is less)  Motor:  Normal  Speech/Language:   Clear and Coherent  Affect:  Depressed and anxious  Mood:  anxious and depressed  Thought process:  goal directed  Thought content:    Rumination and some overthinking  Sensory/Perceptual disturbances:    WNL  Orientation:  oriented to person, place, time/date, situation, day of week, month of year, year, and stated date of Sept 27, 2023  Attention:  Good  Concentration:  Good  Memory:  WNL  Fund of knowledge:   Good  Insight:    Good and Fair  Judgment:   Good  Impulse Control:  Good and Fair   Risk Assessment: Danger to Self:  No Self-injurious Behavior: No Danger to Others: No Duty to Warn:no Physical Aggression / Violence:No  Access to Firearms a concern: No  Gang Involvement:No   Subjective:  Patient today reporting anxiety increased some recently, self doubt, some lack of enthusiasm at times, "I look for approval and praise and husband is not one to do that." He works 3 days per week. "I figured out my husband creates most of my anxiety." States husband is the "love of my life but also the thorn in my side." Trying to figure out how to better interact in relationship with him. Communication between the 2 of them can be volatile and unproductive, sometimes hurtful to patient. Feels there is strong imbalance in their relationship and husband not committed to working on changes. "I want to stop looking for approval and praise from him." Worked further on her people-pleasing tendencies today. Discussed boundary issues and having healthier boundaries and  also calmer demeanor, particularly within family (son and daughter-in-law) per the way patient explained how interactions with him typically go.  Has difficulty in seeing how some of her behaviors can feed relationship issues within the family.  Relates it to her educational experience and that "I felt that I fell through the cracks in my younger years and was promoted through school and passed on to the next grade level without really knowing the material I was to learn."  Processed more of her feeling she has missed out on a lot of fulfillment in her life and it is harder for her to see the positives versus the negatives.  Working to let go of more negativity which she shares again that "I was raised with negativity".  To continue some journaling between sessions and especially on the issues of how thoughts change her life to include being able to see positives, and having some control and setting limits with the negatives "which I have allowed from others".  Interventions: Cognitive Behavioral Therapy and Ego-Supportive  Treatment goal plan: Patient not signing treatment goal plan on computer screen due to Covid. Treatment goals: Treatment goals remain on treatment plan as patient works with strategies to achieve her goals. Progress is assessed each session and documented in "subject" and/or "plan" sections of treatment plan. Long-term goal: Increase understanding of beliefs and messages that produce the worry and anxiety. Short-term goal: Reduce overall level, frequency, and intensity of the anxiety so that daily functioning is not impaired.  Strategies: Patient to learn further about anxiety re: daily skills for managing it, and to use the skills effectively in everyday life.   Diagnosis:   ICD-10-CM   1. Generalized anxiety disorder  F41.1      Plan:  Patient in today showing good motivation and active participation in session focusing more on her relationship issues within the family  particularly husband and son and daughter-in-law.  Worked on more self awareness, nonverbal communication as well as verbal communication, being able to "read" situations more accurately, and reducing "People-pleasing" behaviors.  Also worked on some boundary issues and ways to set and maintain healthy boundaries and being aware of her own behavior and relationships.  Relates some of this to her past history which is significant for her relationship issues, her communication issues and her own self awareness.  Patient is motivated and does make efforts in her goal directed behaviors.  It seems to help in demonstrating or sharing very specific examples for her as we discussed behaviors which may be helpful to her and other behaviors to work on letting go of.  Better understanding some of her relationship issues as she shared more about relationships and dynamics in her family history.  Emphasizing more what she can change versus cannot change, and also her desire to feel less anxious and less controlled by others. Encouraged patient and practicing more positive behaviors including: Having more compassion for herself and others, understand that her thoughts are "just thoughts" and are not necessarily going to play out in reality, stay in the present focusing on what she can control or change, remain in touch with people who are supportive, look for more positives versus negatives each day, healthy nutrition and exercise, refrain from assuming negatives and worst case scenarios, use of positive self talk, reduce overthinking and over analyzing, challenge and counteract her self doubt, and recognize the strengths she shows working with goal directed behaviors to move in a direction that supports her improved emotional health and overall outlook.  Goal review and progress/challenges noted with patient.  Next appointment within 3 weeks.  This record has been created using [employer].  Chart creation errors have  been sought, but may not always have been located and corrected.  Such creation errors do not reflect on the standard of medical care provided.   Mathis Fare, LCSW

## 2021-11-28 ENCOUNTER — Other Ambulatory Visit: Payer: Self-pay | Admitting: Family Medicine

## 2021-11-28 DIAGNOSIS — Z1231 Encounter for screening mammogram for malignant neoplasm of breast: Secondary | ICD-10-CM

## 2021-12-10 ENCOUNTER — Ambulatory Visit (INDEPENDENT_AMBULATORY_CARE_PROVIDER_SITE_OTHER): Payer: Self-pay | Admitting: Psychiatry

## 2021-12-10 DIAGNOSIS — F411 Generalized anxiety disorder: Secondary | ICD-10-CM

## 2021-12-10 NOTE — Progress Notes (Signed)
Patient ID: Valerie [name], female   DOB: [birth year], 61 y.o.   MRN: [id number]  Patient no-showed for appt today and did not call to cancel.

## 2021-12-17 ENCOUNTER — Ambulatory Visit: Payer: Medicare Other | Admitting: Psychiatry

## 2021-12-17 DIAGNOSIS — F411 Generalized anxiety disorder: Secondary | ICD-10-CM

## 2021-12-17 NOTE — Progress Notes (Signed)
[facility] Counselor/Therapist Progress Note  Patient ID: Valerie [name], MRN: [id number],    Date: 12/17/2021  Time Spent: 55 minutes   Treatment Type: Individual Therapy  Reported Symptoms: anxiety, nervousness, difficulty with things being out of her control or unknown  Mental Status Exam:  Appearance:   Neat     Behavior:  Appropriate, Sharing, and Motivated  Motor:  Normal  Speech/Language:   Clear and Coherent  Affect:  anxious  Mood:  anxious  Thought process:  goal directed  Thought content:    WNL  Sensory/Perceptual disturbances:    WNL  Orientation:  oriented to person, place, time/date, situation, day of week, month of year, year, and stated date of Oct. 18, 2023  Attention:  Fair  Concentration:  Fair  Memory:  WNL  Fund of knowledge:   Good  Insight:    Good and Fair  Judgment:   Good and Fair  Impulse Control:  Good and Fair   Risk Assessment: Danger to Self:  No Self-injurious Behavior: No Danger to Others: No Duty to Warn:no Physical Aggression / Violence:No  Access to Firearms a concern: No  Gang Involvement:No   Subjective:  Patient in today reporting anxiety, feeling passive, lack of enthusiasm, self doubt. Looks for approval from husband and he does not typically offer praise to me or others. Feels husband "is the reason for a lot of my anxiety." "He is the love of y life and also the thorn in my side." States in her marriage "he rules the roost". "His behavior gives away his unhappiness and he is very difficult to deal with.States husband and she "went to marital therapy but husband is very Copy and he fooled the therapist." Feels she is "stuck there and has no place to go." Very frustrated and states she is stuck and there's nothing she can do.  Supported her venting her frustrations.  Encourage patient to try to view her situation in different ways and see some options for her to make changes that may contribute to improvement in her  relationship with husband.  Also explored some ways of speaking with has been that he might actually hear her and it possibly make a difference.  There does seem, per her report, to be strong imbalances in their relationship in communicating and decision making.  States she never gets approval and I will praise from him and wants to start having that within herself which we did further explore today.  Also have encouraged her to do some journaling between now and her next session.  Is struggling with the fact that "I was raised with negativity and have continue this and my marital relationship".  She is very much against staying in her marriage.  Interventions: Cognitive Behavioral Therapy and Ego-Supportive   Treatment goal plan: Patient not signing treatment goal plan on computer screen due to Covid. Treatment goals: Treatment goals remain on treatment plan as patient works with strategies to achieve her goals. Progress is assessed each session and documented in "subject" and/or "plan" sections of treatment plan. Long-term goal: Increase understanding of beliefs and messages that produce the worry and anxiety. Short-term goal: Reduce overall level, frequency, and intensity of the anxiety so that daily functioning is not impaired. Strategies: Patient to learn further about anxiety re: daily skills for managing it, and to use the skills effectively in everyday life.   Diagnosis:   ICD-10-CM   1. Generalized anxiety disorder  F41.1  Plan: Patient today showing active participation and good motivation in session working on reported symptoms of anxiety, frustrations, feeling passive in relationships, lack of enthusiasm, and self-doubt.  As we talked it became clear that her most important concerns today were difficulties in relationship with her husband.  They have reportedly been in marital counseling before but "it definitely was not helpful as reportedly husband would not go in and be  truthful".  Continues to have a lot of issues with him which she worked on some today as well as looking at some changes and options for her that might create more clarity and sense of purpose which she would like to have and would not be dependent on husband being involved.  Has a strong history of relationship difficulties and does show some insight into this in sessions. Encouraged her more in looking for what she can change that would be helpful versus cannot change.  She is very clear that she would not leave the marriage but she does want to generate some happiness and connections for herself.  She is to do some journaling between sessions and we will pick back up on this when she returns for her next appointment. Encouraged patient in her practice of more positive behaviors including: Understand that her thoughts are "just thoughts" and are not necessarily going to play out in reality, having more compassion for herself and others, stay in the present focusing on what she can control her change, remain in touch with people who are supportive, look for more positives versus negatives each day, healthy nutrition and exercise, refrain from assuming negatives and worst-case scenarios, use of positive self talk, reduce overthinking and overanalyzing, challenging counteract her self-doubt, and realize the strength she shows working with goal-directed behaviors to move in a direction that supports her improved emotional health and  Goal review and progress/challenges noted with patient.  Next appointment within 2 to 3 weeks.  This record has been created using [employer].  Chart creation errors have been sought, but may not always have been located and corrected.  Such creation errors do not reflect on the standard of medical care provided.   Shanon Ace, LCSW

## 2021-12-22 DIAGNOSIS — E890 Postprocedural hypothyroidism: Secondary | ICD-10-CM | POA: Diagnosis not present

## 2021-12-24 DIAGNOSIS — E538 Deficiency of other specified B group vitamins: Secondary | ICD-10-CM | POA: Diagnosis not present

## 2021-12-24 DIAGNOSIS — E890 Postprocedural hypothyroidism: Secondary | ICD-10-CM | POA: Diagnosis not present

## 2021-12-24 DIAGNOSIS — F3341 Major depressive disorder, recurrent, in partial remission: Secondary | ICD-10-CM | POA: Diagnosis not present

## 2021-12-31 ENCOUNTER — Ambulatory Visit: Payer: Medicare Other | Admitting: Psychiatry

## 2022-01-02 ENCOUNTER — Ambulatory Visit: Payer: Medicare Other

## 2022-01-07 ENCOUNTER — Ambulatory Visit: Payer: Medicare Other | Admitting: Psychiatry

## 2022-01-12 ENCOUNTER — Encounter: Payer: Self-pay | Admitting: Psychiatry

## 2022-01-12 ENCOUNTER — Ambulatory Visit (INDEPENDENT_AMBULATORY_CARE_PROVIDER_SITE_OTHER): Payer: Medicare Other | Admitting: Psychiatry

## 2022-01-12 DIAGNOSIS — F411 Generalized anxiety disorder: Secondary | ICD-10-CM | POA: Diagnosis not present

## 2022-01-12 NOTE — Progress Notes (Signed)
[facility] Counselor/Therapist Progress Note  Patient ID: Valerie [name], MRN: [id number],    Date: 01/12/2022  Time Spent: 50 minutes   Treatment Type: Individual Therapy  Reported Symptoms: anxiety,depression, less "enthusiastic" in daily tasks and hobbies  Mental Status Exam:  Appearance:   Neat     Behavior:  Appropriate, Sharing, and having some difficulty motivating herself  Motor:  Normal  Speech/Language:   Clear and Coherent  Affect:  Anxious, some depression  Mood:  anxious and depressed  Thought process:  goal directed  Thought content:    Obsessions (thoughts and behavior)  Sensory/Perceptual disturbances:    WNL  Orientation:  oriented to person, place, time/date, situation, day of week, month of year, year, and stated date of Nov. 13, 2023  Attention:  Good  Concentration:  Good  Memory:  WNL  Fund of knowledge:   Good  Insight:    Good  Judgment:   Good  Impulse Control:  Good and Fair   Risk Assessment: Danger to Self:  No Self-injurious Behavior: No Danger to Others: No Duty to Warn:no Physical Aggression / Violence:No  Access to Firearms a concern: No  Gang Involvement:No   Subjective: Patient in today reporting anxiety and depression, lack of enthusiasm, and self doubt. Some improvement in "trying to not look for approval from husband so much" and "I want to to feel better and stronger about myself including my decisions from within and not based just on what other think or makes them happy and I end up not being fulfilled." Processed this more in session today as is "definietly contributes to her anxiety and depression. Would like to feel "trustworthy that what I'm doing is right but not movitated by how I please others or not."  "I come from a tumultuous family background" and patient explained this in more detail today and how it has impacted me over the years and even now."  "I've never felt good enough". Worked on this more in session today as  she is sharing newer info that further explains her history and its impact on patient. Wanting to feel more "stabilization, less guilt about not being around family as much." Tension between patient and mother-in-law. Some violence/impulsivity/overdrinking with some in family and acted out on it in inappropriately as patient shared several examples. Looking for approval from husband and sees him as the reason for a lot of her anxiety.Difficult for patient to try new behaviors and to try any letting go within husband's family relationships. Wants to "feel better" about herself and being able to move forward but also struggles with changing in ways that she may need to change in order to make progress. Realizing more the "broken family husband was raised in and sees him acting the same way now with me". But is saying today she wants to stay in her marriage (different from last visit). Homework re: her behavioral and verbal responses within family and will pick up on this next session.  Interventions: Cognitive Behavioral Therapy, Solution-Oriented/Positive Psychology, and Ego-Supportive  Long-term goal: Increase understanding of beliefs and messages that produce the worry and anxiety. Short-term goal: Reduce overall level, frequency, and intensity of the anxiety so that daily functioning is not impaired. Strategies: Patient to learn further about anxiety re: daily skills for managing it, and to use the skills effectively in everyday life.   Diagnosis:   ICD-10-CM   1. Generalized anxiety disorder  F41.1      Plan:  Patient in  today showing good participation and motivation in session as she focused more on family dynamics and the ways that she feels certain family dynamics and relationships contribute to her anxiety and depression and "always lead me in a negative direction."  Reports always feeling that she was just a "victim" to it and could never do anything about it.  However today responded in a  more active way to therapy interventions including some new ways of looking at old situations she reports has held her back for years.  Motivation seem to increase over the course of session and we talked about ways she can help keep that going in between sessions by being involved in some more positive activities, letting herself think more positively, and connections with people who are more helpful for her.  To follow-up on therapy homework regarding behavioral and verbal responses within family, and will process that more in session.   Encouraged patient in her practice of more positive behaviors as noted in session including: Stay in the present focusing on what she can control or change, remain in touch with people who are supportive, look for more positives versus negatives daily, healthy nutrition and exercise, refrain from assuming negatives and worst-case scenarios, positive self talk, reduce overthinking and overanalyzing, challenge and counteract her self-doubt, understand that her thoughts are "just thoughts" and are not necessarily going to play out in reality the way she might listen, having more compassion for herself and others, and recognize the strength she shows working with goal-directed behaviors to move in a direction that supports her improved emotional health.  *To review tobacco history with patient next session.  Goal review and progress/challenges noted with patient.  Next appointment within 2 to 3 weeks.  This record has been created using [employer].  Chart creation errors have been sought, but may not always have been located and corrected.  Such creation errors do not reflect on the standard of medical care provided.   Mathis Fare, LCSW

## 2022-01-13 DIAGNOSIS — R0789 Other chest pain: Secondary | ICD-10-CM | POA: Diagnosis not present

## 2022-01-14 ENCOUNTER — Ambulatory Visit: Payer: Medicare Other | Admitting: Behavioral Health

## 2022-01-14 ENCOUNTER — Encounter: Payer: Self-pay | Admitting: Behavioral Health

## 2022-01-14 DIAGNOSIS — F411 Generalized anxiety disorder: Secondary | ICD-10-CM | POA: Diagnosis not present

## 2022-01-14 MED ORDER — ALPRAZOLAM 0.5 MG PO TABS: 0.5000 mg | ORAL_TABLET | Freq: Two times a day (BID) | ORAL | 0 refills | Status: DC | PRN

## 2022-01-14 NOTE — Progress Notes (Signed)
[facility] Check  Patient ID: Valerie [name],  MRN: [id number]  PCP: Valerie Beams, MD  Date of Evaluation: 01/14/2022 Time spent:30 minutes  Chief Complaint:  Chief Complaint   Anxiety; Follow-up; Medication Refill; Patient Education; Family Problem     HISTORY/CURRENT STATUS: HPI  "Valerie [name]", 61 year old female presents to this office for follow up and medication management.  Says that her medication regimen continues to be fine. She would like to focus more in her therapy sessions about family dynamics and feeling inadequate or unvalidated. She continues to see Rockne Menghini for therapy.  She says that her anxiety today is 3 of 10, and her depression is 2 of 10.  She is sleeping 7 8 hours every night with the aid of Restoril.  She denies any history of mania, no psychosis, no history of auditory or visual hallucinations.  She denies SI or HI.   Prior psychiatric medication trial: Prozac Xanax Temazepam     Individual Medical History/ Review of Systems: Changes? :No   Allergies: Patient has no known allergies.  Current Medications:  Current Outpatient Medications:    ALPRAZolam (XANAX) 0.5 MG tablet, Take 1 tablet (0.5 mg total) by mouth 2 (two) times daily as needed., Disp: 180 tablet, Rfl: 0   FLUoxetine (PROZAC) 20 MG capsule, Take 4 capsules (80 mg total) by mouth daily., Disp: 120 capsule, Rfl: 1   levothyroxine (SYNTHROID) 75 MCG tablet, Take by mouth., Disp: , Rfl:    LINZESS 290 MCG CAPS capsule, Take 290 mcg by mouth every morning., Disp: , Rfl:    omeprazole (PRILOSEC) 40 MG capsule, Take 40 mg by mouth every morning., Disp: , Rfl:    QUEtiapine (SEROQUEL) 25 MG tablet, Take 2 tablets (50 mg total) by mouth at bedtime., Disp: 180 tablet, Rfl: 1   temazepam (RESTORIL) 30 MG capsule, Take 1 capsule (30 mg total) by mouth at bedtime., Disp: 90 capsule, Rfl: 1 Medication Side Effects: none  Family Medical/ Social History: Changes? No  MENTAL HEALTH  EXAM:  There were no vitals taken for this visit.There is no height or weight on file to calculate BMI.  General Appearance: Casual  Eye Contact:  Good  Speech:  Clear and Coherent  Volume:  Normal  Mood:  NA  Affect:  Appropriate  Thought Process:  Coherent  Orientation:  Full (Time, Place, and Person)  Thought Content: Logical   Suicidal Thoughts:  No  Homicidal Thoughts:  No  Memory:  WNL  Judgement:  Fair  Insight:  Good  Psychomotor Activity:  Normal  Concentration:  Concentration: Good  Recall:  Good  Fund of Knowledge: Good  Language: Good  Assets:  Desire for Improvement  ADL's:  Intact  Cognition: WNL  Prognosis:  Good    DIAGNOSES:    ICD-10-CM   1. Generalized anxiety disorder  F41.1 ALPRAZolam (XANAX) 0.5 MG tablet      Receiving Psychotherapy: No    RECOMMENDATIONS:   Greater than 50% of  60 min face to face time with patient was spent on counseling and coordination of care. We talked about her current stability and she does not want to make any medication changes this visit.  I reinforced that I did not feel comfortable increasing this amount nor would I ever approve early fills. She verbally acknowledged and understood. No other medication changes were necessary at this time.  We agreed to; Will continue Xanax 0.5 mg twice daily as needed for severe anxiety To continue Prozac 80 mg daily.  Patient is taking four 20 mg tablets To continue Seroquel 50 mg at bedtime.  Patient is taking two 25 mg tablets at once. To continue temazepam 30 mg capsule at bedtime for sleep. We will report side effects or worsening symptoms promptly Provided emergency contact information We will follow-up in 3 months as required to continue medications. Discussed potential benefits, risk, and side effects of benzodiazepines to include potential risk of tolerance and dependence, as well as possible drowsiness.  Advised patient not to drive if experiencing drowsiness and to take  lowest possible effective dose to minimize risk of dependence and tolerance.  Discussed potential metabolic side effects associated with atypical antipsychotics, as well as potential risk for movement side effects. Advised pt to contact office if movement side effects occur.   Reviewed PDMP  Joan Flores, NP

## 2022-01-19 ENCOUNTER — Ambulatory Visit
Admission: RE | Admit: 2022-01-19 | Discharge: 2022-01-19 | Disposition: A | Discharge status: Home / Self Care | Payer: Medicare Other | Source: Ambulatory Visit | Attending: Family Medicine | Admitting: Family Medicine

## 2022-01-19 DIAGNOSIS — Z1231 Encounter for screening mammogram for malignant neoplasm of breast: Secondary | ICD-10-CM

## 2022-01-21 DIAGNOSIS — E538 Deficiency of other specified B group vitamins: Secondary | ICD-10-CM | POA: Diagnosis not present

## 2022-01-28 ENCOUNTER — Ambulatory Visit (INDEPENDENT_AMBULATORY_CARE_PROVIDER_SITE_OTHER): Payer: Medicare Other | Admitting: Psychiatry

## 2022-01-28 ENCOUNTER — Encounter: Payer: Self-pay | Admitting: Psychiatry

## 2022-01-28 DIAGNOSIS — F411 Generalized anxiety disorder: Secondary | ICD-10-CM

## 2022-01-28 NOTE — Progress Notes (Signed)
[facility] Counselor/Therapist Progress Note  Patient ID: Valerie [name], MRN: [id number],    Date: 01/28/2022  Time Spent: 50 minutes   Treatment Type: Individual Therapy  Reported Symptoms: anxiety, anger, frustration, aggravated , irritable  Mental Status Exam:  Appearance:   Casual and Neat     Behavior:  Appropriate, Sharing, and Motivated  Motor:  Normal  Speech/Language:   Clear and Coherent  Affect:  Anxious, easily aggravated, angry  Mood:  angry, anxious, and irritable  Thought process:  goal directed  Thought content:    Obsessions and I have to be right  Sensory/Perceptual disturbances:    WNL  Orientation:  oriented to person, place, time/date, situation, day of week, month of year, year, and stated date of Nov. 29, 2023  Attention:  Good  Concentration:  Good  Memory:  Notices some short term memory issues and Dr/PA is aware  Fund of knowledge:   Good  Insight:    Good and Fair  Judgment:   Good  Impulse Control:  Good and Fair   Risk Assessment: Danger to Self:  No Self-injurious Behavior: No Danger to Others: No Duty to Warn:no Physical Aggression / Violence:No  Access to Firearms a concern: No  Gang Involvement:No   Subjective:  Patient in today reporting anxiety, depression, irritability, dealing with multiple family stressors and finding communication quite difficult. Completed tobacco questionnaire in [employer]. Discussed some of the family situations worked with last session and she picked up today to continue. Venting freely her anger, hurt and frustration. Easy to misunderstand and mis-read others and may not recognize. Spoke about how proud she is of her kids and grandkids and the "genuine love I feel from them."  "I can put up a very good front and not share my true feelings." Encouraged her not to do that in sessions and she agreed." "Feeling that I am in control and have power is important to me." Discussed some of her financial poor  decision-making that had led to where they are now and do not like it. Processed some of her regrets, frustrations, and did seem to feel heard. Also worked on healthier boundaries especially in conflictual circumstances. Needing to focus on what she can change versus cannot change. Continue homework from last session re: verbal and behavioral responses within family as practiced in session.  Interventions: Cognitive Behavioral Therapy, Solution-Oriented/Positive Psychology, and Ego-Supportive  Long-term goal: Increase understanding of beliefs and messages that produce the worry and anxiety. Short-term goal: Reduce overall level, frequency, and intensity of the anxiety so that daily functioning is not impaired. Strategies: Patient to learn further about anxiety re: daily skills for managing it, and to use the skills effectively in everyday life.   Diagnosis:   ICD-10-CM   1. Generalized anxiety disorder  F41.1      Plan:  Patient more intentionally involved ins session today which is good. Patient progressing some and felt she worked more intentionally today with strategies she can use within family setting. To continue in this direction also using goal-directed behaviors as discussed and practiced in session. Does have a hard time with accepting limits and boundaries in most settings. Trying to get out of "always being the victim" mode. Showing some increased insight and strength, step by step. Encouraged patient in practicing more positive behaviors as discussed in session including: Remain in the present focusing on what she can control her change, follow-up from therapy on feelings of inadequacy especially within the family, staying in  contact with people who are supportive, healthy nutrition and exercise, refrain from assuming negatives and worst-case scenarios, positive self talk, reduce overthinking and over analyzing, challenging counteract her self-doubt, understand that her thoughts are  "just thoughts" and are not necessarily going to play out in reality the way she might anticipate, have more compassion for herself and others, and realize the strength she shows working with goal-directed behaviors to move in a direction that supports her improved emotional health and overall wellbeing.  Goal review and progress/challenges noted with patient.  Next appointment within 2 to 3 weeks.  This record has been created using [employer].  Chart creation errors have been sought, but may not always have been located and corrected.  Such creation errors do not reflect on the standard of medical care provided.   Mathis Fare, LCSW

## 2022-02-09 ENCOUNTER — Ambulatory Visit: Payer: Medicare Other | Admitting: Psychiatry

## 2022-02-09 DIAGNOSIS — F411 Generalized anxiety disorder: Secondary | ICD-10-CM

## 2022-02-09 NOTE — Progress Notes (Signed)
[facility] Counselor/Therapist Progress Note  Patient ID: Valerie [name], MRN: [id number],    Date: 02/09/2022  Time Spent: 55 minutes   Treatment Type: Individual Therapy  Reported Symptoms: anxiety, less depression, some trouble sleeping due to cracked rib healing  Mental Status Exam:  Appearance:   Casual     Behavior:  Appropriate, Sharing, and Motivated  Motor:  Normal  Speech/Language:   Clear and Coherent  Affect:  anxious  Mood:  anxious and depression but it has decreased  Thought process:  goal directed  Thought content:    Rumination and some obsessive thoughts  Sensory/Perceptual disturbances:    WNL  Orientation:  oriented to person, place, time/date, situation, day of week, month of year, year, and stated date of Dec. 11, 2023  Attention:  Good  Concentration:  Good and Fair  Memory:  WNL  Fund of knowledge:   Good  Insight:    Good and Fair  Judgment:   Good  Impulse Control:  Good and Fair   Risk Assessment: Danger to Self:  No Self-injurious Behavior: No Danger to Others: No Duty to Warn:no Physical Aggression / Violence:No  Access to Firearms a concern: No  Gang Involvement:No   Subjective: Patient today reporting anxiety, irritability has lessened some. Depression is "better". Communication within family still a challenge.  Continued work on family situations involving communication, boundaries, always looking for "what goes wrong versus right", hurt, misunderstanding, anger, and frustration. Lots of family issues and patient wanting "to help, not hurt" the situations involved within the family. Younger adult son, she reports, has the more difficult time interacting in healthier ways within family. Patient venting her anger, hurt, and frustration and talking through specific concerns especially with the younger son and husband. Some attempts made also in some reconciliation with mom, who doesn't currently respond with patient. Recognizing her  strengths and her "weaknesses"/areas of needed growth which patient worked on more today. Struggles with guilt within family, and trying to not be "someone who always fuels the fire". Really working on relationships within the family, wanting them to be healthier but also better communicating with each other and with healthier boundaries. Trying to make better choices and setting limits more readily.   Interventions: Cognitive Behavioral Therapy, Solution-Oriented/Positive Psychology, and Ego-Supportive  Long-term goal: Increase understanding of beliefs and messages that produce the worry and anxiety. Short-term goal: Reduce overall level, frequency, and intensity of the anxiety so that daily functioning is not impaired. Strategies: Patient to learn further about anxiety re: daily skills for managing it, and to use the skills effectively in everyday life.   Diagnosis:   ICD-10-CM   1. Generalized anxiety disorder  F41.1      Plan: Patient today actively involved in session and working on issues re: anxiety, frustration, and communication issues involving the family and feeding patient's anxiety.  Worked on changes in her mindset, getting better at "not responding" when not responding as much healthier in the June in which she finds herself at times.  Was very intentionally involved today and has really picked up in her motivation and following through and strategies discussed in sessions.  Today we actually practiced some strategies using specific examples of the type of things that she encounters within the family which she especially found helpful.  Showing more determination.  Feels that she is gaining more insight with the strategies and hoping to be more consistent and trying to implement them within her family setting.  Continues  to use goal-directed behaviors to refrain from "getting in my familiar victim role".  Increased strength and insight gradually. Encouraged patient in her practice of  more positive behaviors as noted in session including: Stay in the present focusing on what she can control or change, follow-up in therapy on feelings of inadequacy especially within the family, staying in contact with people who are supportive, healthy nutrition and exercise, refrain from assuming negatives and worst-case scenarios, positive self talk, reduce her overthinking and over analyzing, challenge and counteract her self-doubt, understand that her thoughts are "just thoughts" and are not necessarily going to play out in reality the way she might anticipate, have more compassion for herself and others, and recognize the strengths she shows working with goal-directed behaviors to move in a direction that supports her improved emotional health and overall wellbeing.  Goal review and progress/challenges noted with patient.  Next appointment within 2 to 3 weeks.  This record has been created using [employer].  Chart creation errors have been sought, but may not always have been located and corrected.  Such creation errors do not reflect on the standard of medical care provided.   Mathis Fare, LCSW

## 2022-02-25 DIAGNOSIS — U071 COVID-19: Secondary | ICD-10-CM | POA: Diagnosis not present

## 2022-03-04 ENCOUNTER — Ambulatory Visit (INDEPENDENT_AMBULATORY_CARE_PROVIDER_SITE_OTHER): Payer: Medicare Other | Admitting: Psychiatry

## 2022-03-04 DIAGNOSIS — F411 Generalized anxiety disorder: Secondary | ICD-10-CM

## 2022-03-04 NOTE — Progress Notes (Signed)
[facility] Counselor/Therapist Progress Note  Patient ID: Valerie [name], MRN: [id number],    Date: 03/04/2022  Time Spent: 55 minutes      Treatment Type: Individual Therapy  Reported Symptoms:  anxiety, feeling and fearful of impending bad things, less tolerant of others; significant issues recently within family especially her mother. "I don't want to feel hostage to these feelings."   Mental Status Exam:  Appearance:   Casual     Behavior:  Appropriate, Sharing, and Motivated  Motor:  Normal  Speech/Language:   Clear and Coherent  Affect:  Anxiety, some depression, fearfulness  Mood:  anxious and some depression, fearfulness  Thought process:  goal directed  Thought content:    Obsessive thoughts  Sensory/Perceptual disturbances:    WNL  Orientation:  oriented to person, place, time/date, situation, day of week, month of year, year, and stated date of Jan. 3, 2024  Attention:  Fair  Concentration:  Good and Fair  Memory:  [place] of knowledge:   Good  Insight:    Good and Fair  Judgment:   Good  Impulse Control:  Good and Fair   Risk Assessment: Danger to Self:  No Self-injurious Behavior: No Danger to Others: No Duty to Warn:no Physical Aggression / Violence:No  Access to Firearms a concern: No  Gang Involvement:No   Subjective:  Patient today reporting anxiety, fearfulness, some depression, "impending bad things but for no reason", and less tolerant of others. The "poor me, feel sorry for me" attitude that my mother has, "I cannot deal with it." Mom says "extremely hurtful comments" to patient and patient struggling with this. "I feel less compassionate" and that bothers me "in a way" but "I also feel she was a mean, manipulative, self-absorbed very young mom" and I "feel she deserves what she's getting  and that I shouldn't have to be there for her all the time for her to feel whole."  "Lacked a lot as a child and now I'm getting stronger and not letting her  manipulate me as much, which angers my mother." Very consumed with this relationship today, which has been set off with mother's illness per patient, which has triggered her. Needed session today to focus more on this and other family relationships. "Dealing with over-dramatization in relationship with mother, age 64". Used some role-playing with patient on this and it seemed to be helpful in her understanding of self and mother.Wants to build on this more and to do some journaling "homework" between sessions. Family communication is challenging.   Interventions: Cognitive Behavioral Therapy, Solution-Oriented/Positive Psychology, and Ego-Supportive  Long-term goal: Increase understanding of beliefs and messages that produce the worry and anxiety. Short-term goal: Reduce overall level, frequency, and intensity of the anxiety so that daily functioning is not impaired. Strategies: Patient to learn further about anxiety re: daily skills for managing it, and to use the skills effectively in everyday life.   Diagnosis:   ICD-10-CM   1. Generalized anxiety disorder  F41.1      Plan: Patient today showing motivation and active participation in session working, as noted above, on personal and family relationship issues, particularly with her aging mother. "I want to feel good about myself and I do still love her but get overwhelmed and angry with her."  Focused more on boundaries, patient's needs, and changes she feels she needs to make going forward in a healthier direction for patient. Efforts to look more for positive versus negatives and worst case  scenarios. Discussed behaviors that can promote reconciliation within families. Continued struggle with guilt and wanting to be a person who does not always look for the negative nor instigating conflict within family. Realizing more her lack of control over others, but that she does have choices as to her own behavior and words. Motivation improving. Trying  to get and stay out of her traditional "victim role."Some improvement in insight. Making progress and to continue goal-directed behaviors helping with her anxiety and in developing better strategies for managing anger, decision-making, stress, anxiety, and family relationships and conflicts. Encouraged patient in practicing more positive behaviors as noted in session including: Staying in the present focusing on what she can change of control, work on her feelings of inadequacy within the family,remain in contact with people who are supportive, healthy nutrition and exercise, refrain from assuming negatives and worst-case scenarios, positive self talk, reduce her overthinking and overanalyzing, challenge and counteract self-doubt, understand that her thoughts are "just thoughts" and are not necessarily going to play out in reality the way she might anticipates, more compassion for self an dothers, and realize the strength she shows working with goal-direceted behaviors to move in a direction that supports her improved emotional health and well being.   Goal review and progress/challenges noted with patient.  Next appointment within 2 to 3 weeks.  This record has been created using [employer].  Chart creation errors have been sought, but may not always have been located and corrected.  Such creation errors do not reflect on the standard of medical care provided.   Shanon Ace, LCSW

## 2022-03-23 ENCOUNTER — Ambulatory Visit: Payer: Medicare Other | Admitting: Psychiatry

## 2022-03-23 ENCOUNTER — Ambulatory Visit (INDEPENDENT_AMBULATORY_CARE_PROVIDER_SITE_OTHER): Payer: Medicare Other | Admitting: Psychiatry

## 2022-03-23 DIAGNOSIS — F411 Generalized anxiety disorder: Secondary | ICD-10-CM

## 2022-03-23 NOTE — Progress Notes (Signed)
[facility] Counselor/Therapist Progress Note  Patient ID: Valerie [name], MRN: [id number],    Date: 03/23/2022  Time Spent: 55 minutes  Treatment Type: Individual Therapy  Reported Symptoms: anxiety "a lot of it", had recent panic attack in [employer] as I hadn't been there before and it was very crowded, feeling a bit overwhelmed with everything and dealing with significant family issues. Less overall tolerance, fears of impending bad things happening, "feeling hostage to my feelings"  Mental Status Exam:  Appearance:   Casual     Behavior:  Appropriate, Sharing, and Motivated  Motor:  Normal  Speech/Language:   Clear and Coherent  Affect:  Anxious, some depression  Mood:  anxious and some depression  Thought process:  goal directed  Thought content:    Rumination and obsessive thoughts  Sensory/Perceptual disturbances:    WNL  Orientation:  oriented to person, place, time/date, situation, day of week, month of year, year, and stated date of Jan. 22, 2024  Attention:  Good  Concentration:  Fair  Memory:  [place] of knowledge:   Good  Insight:    Good and Fair  Judgment:   Good and Fair  Impulse Control:  Fair   Risk Assessment: Danger to Self:  No Self-injurious Behavior: No Danger to Others: No Duty to Warn:no Physical Aggression / Violence:No  Access to Firearms a concern: No  Gang Involvement:No   Subjective:   Patient in today reporting anxiety, depression, less tolerant of husband dictating what I can and cannot do. Not quite as much of the "poor me" attitude that my mom has. Mother continues to make very hurtful comments which she feels definitely relates to her anxiety and depression. Also focusing today on hurt she feels by her mother. Difficult issues between patient and her aging mom continue, which patient processes in session today and this seemed to be helpful short term and patient recognizing while this is a longer term issue, per the concerns she shared  and worked on last session re: hurtfulness patient felt from her mother. Shared and discussed more of this today and states she's trying to help set healthier boundaries for herself and feels that her family "has always been dysfunctional" but better seeing it now and all of the dysfunction involved. Patient angry and trying to process these issues in therapy as she doesn't want to hold it all in and become more angry and self-centered as she gets older. Trying different ways of setting boundaries but knows "this is difficult as others in family mainly adult kids do not want her to set limits nor boundaries.   Interventions: Cognitive Behavioral Therapy and Ego-Supportive  Long-term goal: Increase understanding of beliefs and messages that produce the worry and anxiety. Short-term goal: Reduce overall level, frequency, and intensity of the anxiety so that daily functioning is not impaired. Strategies: Patient to learn further about anxiety re: daily skills for managing it, and to use the skills effectively in everyday life.    Diagnosis:   ICD-10-CM   1. Generalized anxiety disorder  F41.1      Plan:   Patient today actively participating in session and showing good motivation as she continues he work on family/relationship issues, trying to set healthy boundaries, Improving her self esteem and not allow herself to be put down repeatedly by others. Worked well on this in session today and wanting to focus on becoming a stronger more self-reliant and confident person. Difficulty untangling from her past making changes  more challenging. Very unhappy with herself and wants to change. Sees herself negatively including her looks.  Tendency to jump from 1 topic to another and we agreed that she would do some journaling between sessions this time and we will work together next session to prioritize some issues so that we can more heavily concentrate on some issues before jumping to others.  To continue  focusing on better boundaries in and out of the family.  "Do not want some much family conflict".  Does want to be out of her ongoing role of "victim" within the family and beyond.  Some gradual improvement in insight.  Needing better strategies to deal with confrontation, anger, decision making, anxiety and stress, and family relationships that are conflictual. Encouraged patient in practicing more positive behaviors as discussed in session including: Staying in the present focusing on what she can control or change, work on her feelings of inadequacy within the family, remain in contact with people who are supportive of her, healthy nutrition and exercise, refrain from assuming negatives and worst-case scenarios, positive self talk, reduce her overthinking and over analyzing, challenging counteract self-doubt, understand that her thoughts are "just thoughts" and are not necessarily going to play out in reality the way she might anticipate, more compassion for self and others, and recognize the strengths she shows working with goal-directed behaviors to move in a direction that supports her improved emotional health and outlook.  Goal review and progress/challenges noted with patient.  Next appt within 3 weeks.  This record has been created using [employer].  Chart creation errors have been sought, but may not always have been located and corrected.  Such creation errors do not reflect on the standard of medical care provided.    Shanon Ace, LCSW

## 2022-03-31 DIAGNOSIS — S299XXA Unspecified injury of thorax, initial encounter: Secondary | ICD-10-CM | POA: Diagnosis not present

## 2022-03-31 DIAGNOSIS — S2232XA Fracture of one rib, left side, initial encounter for closed fracture: Secondary | ICD-10-CM | POA: Diagnosis not present

## 2022-04-06 ENCOUNTER — Ambulatory Visit: Payer: Medicare Other | Admitting: Psychiatry

## 2022-04-07 ENCOUNTER — Ambulatory Visit (INDEPENDENT_AMBULATORY_CARE_PROVIDER_SITE_OTHER): Payer: Medicare Other | Admitting: Psychiatry

## 2022-04-07 DIAGNOSIS — F411 Generalized anxiety disorder: Secondary | ICD-10-CM

## 2022-04-07 NOTE — Progress Notes (Signed)
[facility] Counselor/Therapist Progress Note  Patient ID: Valerie [name], MRN: [id number],    Date: 04/07/2022  Time Spent: 55 minutes   Treatment Type: Individual Therapy  Reported Symptoms: anxiety, overwhelmed within family, tolerance running low, depression, frustrated, assumes worst case scenarios like "my husband is going to have a heart attack" or someone is going to attack me somewhere"  Mental Status Exam:  Appearance:   Casual     Behavior:  Appropriate, Sharing, and Motivated  Motor:  Normal  Speech/Language:   Clear and Coherent  Affect:  Anxious, depressed  Mood:  anxious and depressed  Thought process:  goal directed  Thought content:    Rumination  Sensory/Perceptual disturbances:    WNL  Orientation:  oriented to person, place, time/date, situation, day of week, month of year, year, and stated date of Feb. 6, 2024  Attention:  Fair  Concentration:  Fair  Memory:  [place] of knowledge:   Good  Insight:    Good and Fair  Judgment:   Good and Fair  Impulse Control:  Good and Fair   Risk Assessment: Danger to Self:  No Self-injurious Behavior: No Danger to Others: No Duty to Warn:no Physical Aggression / Violence:No  Access to Firearms a concern: No  Gang Involvement:No   Subjective:  Patient in today reporting anxiety, depression, low level of tolerance with other adults in family, motivation good in session but reports not so good at home,  frustrated, and assumes worst case scenarios. Being in therapy is the place where I feel heart, respected, and helped. Feels others don't care nor "sympathize with me." Doesn't like husband's trying to control things. Feeling used by son and wife. (Not all details included in this note due to patient privacy needs.) Depression and anxiety are fed by mother's hurtful comments and son's taking advantage of her. Working on healthier boundaries as discussed in session today. Needing time for herself plus physical health  is negatively impacted by certain choices. Letting other family members take advantage of her and feels she can't do anything about it. Patient explains her family has always been dysfunctional. Very difficult to even consider having healthier boundaries within family.  Considering some options and will pick up next  session.  Interventions: Cognitive Behavioral Therapy and Ego-Supportive  Long-term goal: Increase understanding of beliefs and messages that produce the worry and anxiety. Short-term goal: Reduce overall level, frequency, and intensity of the anxiety so that daily functioning is not impaired. Strategies: Patient to learn further about anxiety re: daily skills for managing it, and to use the skills effectively in everyday life.   Diagnosis:   ICD-10-CM   1. Generalized anxiety disorder  F41.1      Plan:  Patient actively participating and showing good motivation in session today as she works further on her feelings of "being taken advantage of and can't set limits", frustrated, anxious, depressed". Did have a lot of energy in talking through issues/concerns today, although currently holding onto her beliefs she "can't make changes". Explored some positives with patient and she found it hard to name any. Will pursue this more with patient next session as we ran out of time today, but patient worked consistently in session today and did well focusing on her challenges within family her fears, and how to be more assertive but "not rude" in order to be heard, understanding others may or may not respond in helpful manner. Currently feeling she can't make changes but agrees  to consider it more between sessions. Working also on self esteem which is another challenge for patient as she struggles in family relationships. Also weak with family boundaries, deflated self-esteem, wanting to be stronger but "have always doubting myself".  Gets easily "covered up" when family members talk down to her  or are offensive, and do not consider her needs.  Difficulty untangling from her past and making better decisions currently.  Long history of feeling badly about herself and not able to make healthy changes. Will continue to work on this next session and patient showing good motivation and follow-through today. Encouraged patient in practicing positive behaviors as noted in session including: Remaining in the present focusing on what she can change her control, work on her feelings of inadequacy within the family, remain in contact with supportive people, healthy nutrition and exercise, refrain from assuming negatives and worst-case scenarios, positive self talk, reduce her overthinking and over analyzing, challenge and counteract self-doubt, understand that her thoughts are "just thoughts" and are not necessarily going to play out in reality the way she might anticipate, having more compassion for self and others, not responding so quickly in conversations that are stressful and really hear what the other person is saying before responding, and realize the strength she shows working with goal-directed behaviors to move in a direction that supports her improved emotional health and overall wellbeing.  Goal review and progress/challenges noted with patient.  Next appointment within 2 to 3 weeks.  This record has been created using [employer].  Chart creation errors have been sought, but may not always have been located and corrected.  Such creation errors do not reflect on the standard of medical care provided.   Shanon Ace, LCSW

## 2022-04-13 ENCOUNTER — Ambulatory Visit (INDEPENDENT_AMBULATORY_CARE_PROVIDER_SITE_OTHER): Payer: Medicare Other | Admitting: Behavioral Health

## 2022-04-13 ENCOUNTER — Encounter: Payer: Self-pay | Admitting: Behavioral Health

## 2022-04-13 DIAGNOSIS — F5105 Insomnia due to other mental disorder: Secondary | ICD-10-CM | POA: Diagnosis not present

## 2022-04-13 DIAGNOSIS — F331 Major depressive disorder, recurrent, moderate: Secondary | ICD-10-CM | POA: Diagnosis not present

## 2022-04-13 DIAGNOSIS — F99 Mental disorder, not otherwise specified: Secondary | ICD-10-CM | POA: Diagnosis not present

## 2022-04-13 DIAGNOSIS — F411 Generalized anxiety disorder: Secondary | ICD-10-CM

## 2022-04-13 MED ORDER — QUETIAPINE FUMARATE 100 MG PO TABS
100.0000 mg | ORAL_TABLET | Freq: Every day | ORAL | 3 refills | Status: DC
Start: 2022-04-13 — End: 2022-07-13

## 2022-04-13 MED ORDER — FLUOXETINE HCL 20 MG PO CAPS: 80.0000 mg | ORAL_CAPSULE | Freq: Every day | ORAL | 3 refills | Status: DC

## 2022-04-13 NOTE — Progress Notes (Signed)
[facility] Med Check  Patient ID: Valerie [name],  MRN: [id number]  PCP: Caren Macadam, MD  Date of Evaluation: 04/13/2022 Time spent:30 minutes  Chief Complaint:  Chief Complaint   Anxiety; Medication Problem; Medication Refill; Patient Education; Follow-up; Depression     HISTORY/CURRENT STATUS: HPI  "Valerie [name]", 62 year old female presents to this office for follow up and medication management.  Says that her medication regimen continues to be fine. She is starting to have some sleeping problems again. Feels like she could use an increase of her Seroquel.  She continues to see Rinaldo Cloud for therapy.  She says that her anxiety today is 2of 10, and her depression is 2 of 10.  She is sleeping 7 8 hours every night with the aid of Restoril.  She denies any history of mania, no psychosis, no history of auditory or visual hallucinations.  She denies SI or HI.   Prior psychiatric medication trial: Prozac Xanax Temazepam     Individual Medical History/ Review of Systems: Changes? :No   Allergies: Patient has no known allergies.  Current Medications:  Current Outpatient Medications:    QUEtiapine (SEROQUEL) 100 MG tablet, Take 1 tablet (100 mg total) by mouth at bedtime., Disp: 30 tablet, Rfl: 3   ALPRAZolam (XANAX) 0.5 MG tablet, Take 1 tablet (0.5 mg total) by mouth 2 (two) times daily as needed., Disp: 180 tablet, Rfl: 0   FLUoxetine (PROZAC) 20 MG capsule, Take 4 capsules (80 mg total) by mouth daily., Disp: 120 capsule, Rfl: 3   levothyroxine (SYNTHROID) 75 MCG tablet, Take by mouth., Disp: , Rfl:    LINZESS 290 MCG CAPS capsule, Take 290 mcg by mouth every morning., Disp: , Rfl:    omeprazole (PRILOSEC) 40 MG capsule, Take 40 mg by mouth every morning., Disp: , Rfl:    QUEtiapine (SEROQUEL) 25 MG tablet, Take 2 tablets (50 mg total) by mouth at bedtime., Disp: 180 tablet, Rfl: 1   temazepam (RESTORIL) 30 MG capsule, Take 1 capsule (30 mg total) by mouth at bedtime.,  Disp: 90 capsule, Rfl: 1 Medication Side Effects: none  Family Medical/ Social History: Changes? No  MENTAL HEALTH EXAM:  There were no vitals taken for this visit.There is no height or weight on file to calculate BMI.  General Appearance: Casual, Neat, and Well Groomed  Eye Contact:  Good  Speech:  Clear and Coherent  Volume:  Normal  Mood:  Anxious and Depressed  Affect:  Appropriate  Thought Process:  Coherent  Orientation:  Full (Time, Place, and Person)  Thought Content: Logical   Suicidal Thoughts:  No  Homicidal Thoughts:  No  Memory:  WNL  Judgement:  Good  Insight:  Good  Psychomotor Activity:  Normal  Concentration:  Concentration: Good  Recall:  Good  Fund of Knowledge: Good  Language: Good  Assets:  Desire for Improvement  ADL's:  Intact  Cognition: WNL  Prognosis:  Good    DIAGNOSES:    ICD-10-CM   1. Generalized anxiety disorder  F41.1 QUEtiapine (SEROQUEL) 100 MG tablet    FLUoxetine (PROZAC) 20 MG capsule    2. Major depressive disorder, recurrent episode, moderate (HCC)  F33.1 FLUoxetine (PROZAC) 20 MG capsule    3. Insomnia due to other mental disorder  F51.05 QUEtiapine (SEROQUEL) 100 MG tablet   F99       Receiving Psychotherapy: Yes    RECOMMENDATIONS:   Greater than 50% of  30 min face to face time with patient was spent on counseling and  coordination of care. We talked about her current stability. She is still doing well but not sleeping well at night.   No other medication changes were necessary at this time.  We agreed to; Will continue Xanax 0.5 mg twice daily as needed for severe anxiety To continue Prozac 80 mg daily.  Patient is taking four 20 mg tablets To continue Seroquel 50 mg at bedtime.  Patient is taking two 25 mg tablets at once. To continue temazepam 30 mg capsule at bedtime for sleep. We will report side effects or worsening symptoms promptly Provided emergency contact information We will follow-up in 3 months as  required to continue medications. Discussed potential benefits, risk, and side effects of benzodiazepines to include potential risk of tolerance and dependence, as well as possible drowsiness.  Advised patient not to drive if experiencing drowsiness and to take lowest possible effective dose to minimize risk of dependence and tolerance.  Discussed potential metabolic side effects associated with atypical antipsychotics, as well as potential risk for movement side effects. Advised pt to contact office if movement side effects occur.   Reviewed PDMP      Elwanda [place], NP

## 2022-04-17 DIAGNOSIS — H524 Presbyopia: Secondary | ICD-10-CM | POA: Diagnosis not present

## 2022-04-20 ENCOUNTER — Ambulatory Visit: Payer: Medicare Other | Admitting: Psychiatry

## 2022-04-20 DIAGNOSIS — F411 Generalized anxiety disorder: Secondary | ICD-10-CM

## 2022-04-20 NOTE — Progress Notes (Signed)
[facility] Counselor/Therapist Progress Note  Patient ID: Valerie [name], MRN: [id number],    Date: 04/20/2022  Time Spent:  55 minutes  Treatment Type: Individual Therapy  Reported Symptoms: anxiety, depression, difficulty with being "used/manipulated"   Mental Status Exam:  Appearance:   Casual     Behavior:  Appropriate, Sharing, and Motivated  Motor:  Normal  Speech/Language:   Clear and Coherent  Affect:  anxious  Mood:  anxious  Thought process:  goal directed  Thought content:    Rumination  Sensory/Perceptual disturbances:    WNL  Orientation:  oriented to person, place, time/date, situation, day of week, month of year, year, and stated date of Feb. 19, 2024  Attention:  Good  Concentration:  Good  Memory:  WNL  Fund of knowledge:   Good  Insight:    Good and Fair  Judgment:   Good and Fair  Impulse Control:  Good and Fair   Risk Assessment: Danger to Self:  No Self-injurious Behavior: No Danger to Others: No Duty to Warn:no Physical Aggression / Violence:No  Access to Firearms a concern: No  Gang Involvement:No   Subjective: Patient in today reporting anxiety as "main symptom", some depression, and low frustration tolerance level mostly within the family. Seeing some gains as she is working to set better boundaries. Still having more challenges at home and within family relationships. Continues to often assume worst case scenarios. Is beginning to speak up more for herself. Often "get used by family members" and having difficulty saying "No" to them. Often feeling hurt and disrespected within family and is trying to limit this with better responses and healthier boundaries.  Hurt by son and wife, and husband's need to control. Also hurt by elderly mom with frequent hurtful comments, and trying to manage this better. Focusing on better communication in limit setting and boundaries.   Interventions: Cognitive Behavioral Therapy and Ego-Supportive  Long-term  goal: Increase understanding of beliefs and messages that produce the worry and anxiety. Short-term goal: Reduce overall level, frequency, and intensity of the anxiety so that daily functioning is not impaired. Strategies: Patient to learn further about anxiety re: daily skills for managing it, and to use the skills effectively in everyday life.   Diagnosis:   ICD-10-CM   1. Generalized anxiety disorder  F41.1      Plan: Patient showing good motivation and participation in session today as she worked more on setting appropriate boundaries as noted above, and being able to say "No" as needed especially within the family.  Ends up feeling more depressed, anxious, and frustrated. Working hard to form healthier boundaries going forward. Does seem to realize some of the personal growth she is needing to work on further to eventually be healthier and happier.  Has made progress and needs to continue her work with goal-directed behaviors in order to keep progressing.Encouraged patient and practicing positive and self affirming behaviors as noted in session including: Staying in the present focusing on what she can change or control, work on her feelings of inadequacy within the family, remain in contact with supportive people, healthy nutrition and exercise, refrain from assuming negatives and worst-case scenarios, positive self talk, reduce her overthinking and over analyzing, challenging counteract self-doubt, understand that her thoughts are "just thoughts" and are not necessarily going to play out in reality the way she might anticipate, having more compassion for self and others, not responding so quickly in conversations that are stressful and really hear what the  other person is saying before responding, and recognize the strength she shows working with goal-directed behaviors to move in a direction that supports her improved emotional health and outlook.  Goal review and progress/challenges noted with  patient.  Next appointment within 3 weeks.  This record has been created using [employer].  Chart creation errors have been sought, but may not always have been located and corrected.  Such creation errors do not reflect on the standard of medical care provided.   Shanon Ace, LCSW

## 2022-04-21 ENCOUNTER — Inpatient Hospital Stay (HOSPITAL_COMMUNITY)
Admission: EM | Admit: 2022-04-21 | Discharge: 2022-04-24 | DRG: 390 | Disposition: A | Discharge status: Home / Self Care | Payer: Medicare Other | Attending: Internal Medicine | Admitting: Internal Medicine

## 2022-04-21 ENCOUNTER — Emergency Department (HOSPITAL_COMMUNITY): Payer: Medicare Other

## 2022-04-21 ENCOUNTER — Other Ambulatory Visit: Payer: Self-pay

## 2022-04-21 ENCOUNTER — Inpatient Hospital Stay (HOSPITAL_COMMUNITY): Payer: Medicare Other

## 2022-04-21 ENCOUNTER — Encounter (HOSPITAL_COMMUNITY): Payer: Self-pay | Admitting: Emergency Medicine

## 2022-04-21 DIAGNOSIS — K219 Gastro-esophageal reflux disease without esophagitis: Secondary | ICD-10-CM | POA: Diagnosis not present

## 2022-04-21 DIAGNOSIS — Z1152 Encounter for screening for COVID-19: Secondary | ICD-10-CM

## 2022-04-21 DIAGNOSIS — Z9889 Other specified postprocedural states: Secondary | ICD-10-CM | POA: Diagnosis not present

## 2022-04-21 DIAGNOSIS — K565 Intestinal adhesions [bands], unspecified as to partial versus complete obstruction: Secondary | ICD-10-CM | POA: Diagnosis not present

## 2022-04-21 DIAGNOSIS — N200 Calculus of kidney: Secondary | ICD-10-CM | POA: Diagnosis not present

## 2022-04-21 DIAGNOSIS — E890 Postprocedural hypothyroidism: Secondary | ICD-10-CM | POA: Diagnosis not present

## 2022-04-21 DIAGNOSIS — K5669 Other partial intestinal obstruction: Secondary | ICD-10-CM | POA: Diagnosis not present

## 2022-04-21 DIAGNOSIS — Z4659 Encounter for fitting and adjustment of other gastrointestinal appliance and device: Secondary | ICD-10-CM | POA: Diagnosis not present

## 2022-04-21 DIAGNOSIS — K6389 Other specified diseases of intestine: Secondary | ICD-10-CM | POA: Diagnosis not present

## 2022-04-21 DIAGNOSIS — Z886 Allergy status to analgesic agent status: Secondary | ICD-10-CM

## 2022-04-21 DIAGNOSIS — K56609 Unspecified intestinal obstruction, unspecified as to partial versus complete obstruction: Secondary | ICD-10-CM

## 2022-04-21 DIAGNOSIS — Z602 Problems related to living alone: Secondary | ICD-10-CM | POA: Diagnosis not present

## 2022-04-21 DIAGNOSIS — Z880 Allergy status to penicillin: Secondary | ICD-10-CM | POA: Diagnosis not present

## 2022-04-21 DIAGNOSIS — K449 Diaphragmatic hernia without obstruction or gangrene: Secondary | ICD-10-CM | POA: Diagnosis not present

## 2022-04-21 DIAGNOSIS — Z9884 Bariatric surgery status: Secondary | ICD-10-CM

## 2022-04-21 DIAGNOSIS — E039 Hypothyroidism, unspecified: Secondary | ICD-10-CM | POA: Diagnosis not present

## 2022-04-21 DIAGNOSIS — R9431 Abnormal electrocardiogram [ECG] [EKG]: Secondary | ICD-10-CM | POA: Diagnosis not present

## 2022-04-21 DIAGNOSIS — Z79890 Hormone replacement therapy (postmenopausal): Secondary | ICD-10-CM | POA: Diagnosis not present

## 2022-04-21 DIAGNOSIS — E876 Hypokalemia: Secondary | ICD-10-CM | POA: Diagnosis present

## 2022-04-21 DIAGNOSIS — F419 Anxiety disorder, unspecified: Secondary | ICD-10-CM | POA: Diagnosis present

## 2022-04-21 DIAGNOSIS — R1084 Generalized abdominal pain: Principal | ICD-10-CM

## 2022-04-21 DIAGNOSIS — Z4682 Encounter for fitting and adjustment of non-vascular catheter: Secondary | ICD-10-CM | POA: Diagnosis not present

## 2022-04-21 DIAGNOSIS — R7401 Elevation of levels of liver transaminase levels: Secondary | ICD-10-CM | POA: Diagnosis present

## 2022-04-21 DIAGNOSIS — Z8719 Personal history of other diseases of the digestive system: Secondary | ICD-10-CM | POA: Diagnosis not present

## 2022-04-21 DIAGNOSIS — Z79899 Other long term (current) drug therapy: Secondary | ICD-10-CM | POA: Diagnosis not present

## 2022-04-21 HISTORY — DX: Unspecified intestinal obstruction, unspecified as to partial versus complete obstruction: K56.609

## 2022-04-21 LAB — CBC WITH DIFFERENTIAL/PLATELET
Abs Immature Granulocytes: 0.04 10*3/uL (ref 0.00–0.07)
Basophils Absolute: 0 10*3/uL (ref 0.0–0.1)
Basophils Relative: 0 %
Eosinophils Absolute: 0.1 10*3/uL (ref 0.0–0.5)
Eosinophils Relative: 1 %
HCT: 43.9 % (ref 36.0–46.0)
Hemoglobin: 14.2 g/dL (ref 12.0–15.0)
Immature Granulocytes: 0 %
Lymphocytes Relative: 6 %
Lymphs Abs: 0.7 10*3/uL (ref 0.7–4.0)
MCH: 29.9 pg (ref 26.0–34.0)
MCHC: 32.3 g/dL (ref 30.0–36.0)
MCV: 92.4 fL (ref 80.0–100.0)
Monocytes Absolute: 1.4 10*3/uL — ABNORMAL HIGH (ref 0.1–1.0)
Monocytes Relative: 12 %
Neutro Abs: 9.6 10*3/uL — ABNORMAL HIGH (ref 1.7–7.7)
Neutrophils Relative %: 81 %
Platelets: 231 10*3/uL (ref 150–400)
RBC: 4.75 MIL/uL (ref 3.87–5.11)
RDW: 13.4 % (ref 11.5–15.5)
WBC: 11.8 10*3/uL — ABNORMAL HIGH (ref 4.0–10.5)
nRBC: 0 % (ref 0.0–0.2)

## 2022-04-21 LAB — COMPREHENSIVE METABOLIC PANEL
ALT: 34 U/L (ref 0–44)
AST: 51 U/L — ABNORMAL HIGH (ref 15–41)
Albumin: 4.2 g/dL (ref 3.5–5.0)
Alkaline Phosphatase: 107 U/L (ref 38–126)
Anion gap: 11 (ref 5–15)
BUN: 23 mg/dL (ref 8–23)
CO2: 18 mmol/L — ABNORMAL LOW (ref 22–32)
Calcium: 9.3 mg/dL (ref 8.9–10.3)
Chloride: 109 mmol/L (ref 98–111)
Creatinine, Ser: 0.98 mg/dL (ref 0.44–1.00)
GFR, Estimated: >60 mL/min (ref >60)
Glucose, Bld: 151 mg/dL — ABNORMAL HIGH (ref 70–99)
Potassium: 3.9 mmol/L (ref 3.5–5.1)
Sodium: 138 mmol/L (ref 135–145)
Total Bilirubin: 0.9 mg/dL (ref 0.3–1.2)
Total Protein: 7.3 g/dL (ref 6.5–8.1)

## 2022-04-21 LAB — TROPONIN I (HIGH SENSITIVITY): Troponin I (High Sensitivity): <2 ng/L (ref <18)

## 2022-04-21 LAB — LIPASE, BLOOD: Lipase: 37 U/L (ref 11–51)

## 2022-04-21 MED ORDER — LACTATED RINGERS IV SOLN: INTRAVENOUS | Status: DC

## 2022-04-21 MED ORDER — ACETAMINOPHEN 325 MG PO TABS
650.0000 mg | ORAL_TABLET | Freq: Four times a day (QID) | ORAL | Status: DC | PRN
  Filled 2022-04-21: qty 2

## 2022-04-21 MED ORDER — ONDANSETRON HCL 4 MG/2ML IJ SOLN
4.0000 mg | Freq: Once | INTRAMUSCULAR | Status: AC
  Administered 2022-04-21: 4 mg via INTRAVENOUS
  Filled 2022-04-21: qty 2

## 2022-04-21 MED ORDER — IOHEXOL 300 MG/ML  SOLN
80.0000 mL | Freq: Once | INTRAMUSCULAR | Status: AC | PRN
  Administered 2022-04-21: 80 mL via INTRAVENOUS

## 2022-04-21 MED ORDER — SODIUM CHLORIDE 0.9 % IV SOLN: INTRAVENOUS | Status: DC

## 2022-04-21 MED ORDER — LORAZEPAM 2 MG/ML IJ SOLN
0.5000 mg | Freq: Two times a day (BID) | INTRAMUSCULAR | Status: DC | PRN
  Administered 2022-04-22 – 2022-04-23 (×5): 0.5 mg via INTRAVENOUS
  Filled 2022-04-21 (×5): qty 1

## 2022-04-21 MED ORDER — LACTATED RINGERS IV BOLUS
1000.0000 mL | Freq: Once | INTRAVENOUS | Status: AC
  Administered 2022-04-21: 1000 mL via INTRAVENOUS

## 2022-04-21 MED ORDER — ACETAMINOPHEN 650 MG RE SUPP: 650.0000 mg | Freq: Four times a day (QID) | RECTAL | Status: DC | PRN

## 2022-04-21 MED ORDER — HYDROMORPHONE HCL 1 MG/ML IJ SOLN
0.5000 mg | INTRAMUSCULAR | Status: DC | PRN
  Administered 2022-04-21 – 2022-04-22 (×3): 1 mg via INTRAVENOUS
  Filled 2022-04-21 (×3): qty 1

## 2022-04-21 MED ORDER — MORPHINE SULFATE (PF) 4 MG/ML IV SOLN
4.0000 mg | Freq: Once | INTRAVENOUS | Status: AC
  Administered 2022-04-21: 4 mg via INTRAVENOUS
  Filled 2022-04-21: qty 1

## 2022-04-21 MED ORDER — METOCLOPRAMIDE HCL 5 MG/ML IJ SOLN
5.0000 mg | Freq: Four times a day (QID) | INTRAMUSCULAR | Status: DC | PRN
  Administered 2022-04-21 – 2022-04-23 (×4): 5 mg via INTRAVENOUS
  Filled 2022-04-21 (×4): qty 2

## 2022-04-21 NOTE — H&P (Signed)
Valerie [name] [id number] DOB: 1960-09-04 DOA: 04/21/2022      PCP: Caren Macadam, MD   Outpatient Specialists:     GI  Dr. Therisa Doyne  Sadie Haber, )     Patient arrived to ER on 04/21/22 at 1916 Referred by Attending Tretha Sciara, MD   Patient coming from:    home Lives alone,       Chief Complaint:   Chief Complaint  Patient presents with   Abdominal Pain    HPI: Valerie [name] is a 62 y.o. female with medical history significant of gastric sleeve procedure, small bowel obstruction History of anxiety  Presented with nausea vomiting abdominal pain Abd pain for the past 3 days, hx of gastric sleeve,  Hx of SBO in the past N/v and chills  Had a BM today patient has limited her p.o. intake today because she was concerned she has small bowel obstruction.  As she did before. Denies any chest pain or shortness of breath      Regarding pertinent Chronic problems:       Hypothyroidism:  on synthroid       While in ER:    Ct showed Showed SBO     CTabd/pelvis - Mild proximal jejunal distension and scattered gas fluid levels, consistent with early or intermittent small bowel obstruction. . Gas fluid levels throughout the colon, which may reflect diarrhea or recent laxative/enema use.    Following Medications were ordered in ER: Medications  lactated ringers infusion ( Intravenous New Bag/Given 04/21/22 2204)  ondansetron (ZOFRAN) injection 4 mg (4 mg Intravenous Given 04/21/22 2000)  lactated ringers bolus 1,000 mL (1,000 mLs Intravenous Bolus 04/21/22 2001)  morphine (PF) 4 MG/ML injection 4 mg (4 mg Intravenous Given 04/21/22 2000)  iohexol (OMNIPAQUE) 300 MG/ML solution 80 mL (80 mLs Intravenous Contrast Given 04/21/22 2051)    _______________________________________________________ ER Provider Called:   General Surgery  Dr. Gershon Crane They Recommend admit to medicine  ,refused Ng tube Will see in AM      ED Triage Vitals  Enc Vitals Group     BP 04/21/22  1922 127/80     Pulse Rate 04/21/22 1922 (!) 110     Resp 04/21/22 1922 19     Temp 04/21/22 1922 97.8 F (36.6 C)     Temp Source 04/21/22 1922 Oral     SpO2 04/21/22 1922 100 %     Weight 04/21/22 1921 112 lb (50.8 kg)     Height --      Head Circumference --      Peak Flow --      Pain Score 04/21/22 1921 7     Pain Loc --      Pain Edu? --      Excl. in [place]? --   TMAX(24)@     _________________________________________ Significant initial  Findings: Abnormal Labs Reviewed  CBC WITH DIFFERENTIAL/PLATELET - Abnormal; Notable for the following components:      Result Value   WBC 11.8 (*)    Neutro Abs 9.6 (*)    Monocytes Absolute 1.4 (*)    All other components within normal limits  COMPREHENSIVE METABOLIC PANEL - Abnormal; Notable for the following components:   CO2 18 (*)    Glucose, Bld 151 (*)    AST 51 (*)    All other components within normal limits    _________________________ Troponin   ECG: Ordered Personally reviewed and interpreted by me showing: HR : 85 Rhythm: Sinus rhythm Borderline left  axis deviation Borderline prolonged QT interval QT 488    The recent clinical data is shown below. Vitals:   04/21/22 1921 04/21/22 1922  BP:  127/80  Pulse:  (!) 110  Resp:  19  Temp:  97.8 F (36.6 C)  TempSrc:  Oral  SpO2:  100%  Weight: 50.8 kg        WBC     Component Value Date/Time   WBC 11.8 (H) 04/21/2022 1940   LYMPHSABS 0.7 04/21/2022 1940   MONOABS 1.4 (H) 04/21/2022 1940   EOSABS 0.1 04/21/2022 1940   BASOSABS 0.0 04/21/2022 1940    Lactic Acid, Venous No results found for: "LATICACIDVEN"     UA ordered    __________________________________________ Hospitalist was called for admission for SBO   The following Work up has been ordered so far:  Orders Placed This Encounter  Procedures   CT ABDOMEN PELVIS W CONTRAST   CBC with Differential   Comprehensive metabolic panel   Lipase, blood   Urinalysis, Routine w reflex microscopic  -Urine, Clean Catch   Consult to general surgery   Consult to hospitalist   ED EKG     OTHER Significant initial  Findings:  labs showing:  Recent Labs  Lab 04/21/22 1940  NA 138  K 3.9  CO2 18*  GLUCOSE 151*  BUN 23  CREATININE 0.98  CALCIUM 9.3    Cr  stable,    Lab Results  Component Value Date   CREATININE 0.98 04/21/2022    Recent Labs  Lab 04/21/22 1940  AST 51*  ALT 34  ALKPHOS 107  BILITOT 0.9  PROT 7.3  ALBUMIN 4.2   Lab Results  Component Value Date   CALCIUM 9.3 04/21/2022       Plt: Lab Results  Component Value Date   PLT 231 04/21/2022    COVID-19 Labs    Recent Labs  Lab 04/21/22 1940  WBC 11.8*  NEUTROABS 9.6*  HGB 14.2  HCT 43.9  MCV 92.4  PLT 231    HG/HCT  stable,      Component Value Date/Time   HGB 14.2 04/21/2022 1940   HCT 43.9 04/21/2022 1940   MCV 92.4 04/21/2022 1940      Recent Labs  Lab 04/21/22 1940  LIPASE 37     Cultures: No results found for: "SDES", "SPECREQUEST", "CULT", "REPTSTATUS"   Radiological Exams on Admission: CT ABDOMEN PELVIS W CONTRAST  Result Date: 04/21/2022 CLINICAL DATA:  Generalized abdominal pain left greater than right EXAM: CT ABDOMEN AND PELVIS WITH CONTRAST TECHNIQUE: Multidetector CT imaging of the abdomen and pelvis was performed using the standard protocol following bolus administration of intravenous contrast. RADIATION DOSE REDUCTION: This exam was performed according to the departmental dose-optimization program which includes automated exposure control, adjustment of the mA and/or kV according to patient size and/or use of iterative reconstruction technique. CONTRAST:  34m OMNIPAQUE IOHEXOL 300 MG/ML  SOLN COMPARISON:  None Available. FINDINGS: Lower chest: No acute pleural or parenchymal lung disease. Hepatobiliary: No focal liver abnormality is seen. Status post cholecystectomy. No biliary dilatation. Pancreas: Unremarkable. No pancreatic ductal dilatation or surrounding  inflammatory changes. Spleen: Normal in size without focal abnormality. Adrenals/Urinary Tract: Multiple nonobstructing left renal calculi, largest measuring 6 mm. Mild cortical scarring upper pole right kidney. Scattered subcentimeter hypodensities throughout the kidneys are too small to characterize but likely reflect small cysts. No specific imaging follow-up is required. The adrenals and bladder are unremarkable. Stomach/Bowel: Postsurgical changes are seen from previous  gastric bariatric surgery as well as multiple small bowel resections and reanastomosis. Multiple dilated loops of mid jejunum are identified, measuring up to 3.2 cm in diameter with scattered gas fluid levels. The distal jejunum and ileum are decompressed, with transition point in the right lower quadrant reference image 51/2. There is moderate distention of the colon with diffuse gas fluid levels. This could reflect diarrhea or recent laxative/enema use. No bowel wall thickening or inflammatory change. Vascular/Lymphatic: Aortic atherosclerosis. Subcentimeter lymph nodes within the central mesentery are likely reactive. No pathologic adenopathy. Reproductive: Status post hysterectomy. No adnexal masses. Other: No free fluid or free intraperitoneal gas. No abdominal wall hernia. Musculoskeletal: No acute or destructive bony lesions. Reconstructed images demonstrate no additional findings. IMPRESSION: 1. Mild proximal jejunal distension and scattered gas fluid levels, consistent with early or intermittent small bowel obstruction. 2. Gas fluid levels throughout the colon, which may reflect diarrhea or recent laxative/enema use. 3. Reactive subcentimeter central mesenteric lymph nodes. No pathologic adenopathy. 4. Nonobstructing left renal calculi, largest measuring 6 mm. 5.  Aortic Atherosclerosis (ICD10-I70.0). Electronically Signed   By: Randa Ngo M.D.   On: 04/21/2022 21:09    _______________________________________________________________________________________________________ Latest  Blood pressure 127/80, pulse (!) 110, temperature 97.8 F (36.6 C), temperature source Oral, resp. rate 19, weight 50.8 kg, SpO2 100 %.   Vitals  labs and radiology finding personally reviewed  Review of Systems:    Pertinent positives include:  abdominal pain, nausea, vomiting , chills, fatigue, Constitutional:  No weight loss, night sweats, Fevers weight loss  HEENT:  No headaches, Difficulty swallowing,Tooth/dental problems,Sore throat,  No sneezing, itching, ear ache, nasal congestion, post nasal drip,  Cardio-vascular:  No chest pain, Orthopnea, PND, anasarca, dizziness, palpitations.no Bilateral lower extremity swelling  GI:  No heartburn, indigestion, a, diarrhea, change in bowel habits, loss of appetite, melena, blood in stool, hematemesis Resp:  no shortness of breath at rest. No dyspnea on exertion, No excess mucus, no productive cough, No non-productive cough, No coughing up of blood.No change in color of mucus.No wheezing. Skin:  no rash or lesions. No jaundice GU:  no dysuria, change in color of urine, no urgency or frequency. No straining to urinate.  No flank pain.  Musculoskeletal:  No joint pain or no joint swelling. No decreased range of motion. No back pain.  Psych:  No change in mood or affect. No depression or anxiety. No memory loss.  Neuro: no localizing neurological complaints, no tingling, no weakness, no double vision, no gait abnormality, no slurred speech, no confusion  All systems reviewed and apart from [place] all are negative _______________________________________________________________________________________________ Past Medical History:   Past Medical History:  Diagnosis Date   Small bowel obstruction ([place])       Past Surgical History:  Procedure Laterality Date   OTHER SURGICAL HISTORY     LINX procedure, do not insert  traditional NG    Social History:  Ambulatory   independently       reports that she has never smoked. She has never used smokeless tobacco. She reports that she does not drink alcohol and does not use drugs.     Family History:  Family History  Problem Relation Age of Onset   Breast cancer Neg Hx    ______________________________________________________________________________________________ Allergies: Allergies  Allergen Reactions   Oxycodone-Acetaminophen Itching   Penicillins Hives     Prior to Admission medications   Medication Sig Start Date End Date Taking? Authorizing Provider  ALPRAZolam Duanne Moron) 0.5 MG tablet Take 1  tablet (0.5 mg total) by mouth 2 (two) times daily as needed. 01/14/22   Elwanda Brooklyn, NP  FLUoxetine (PROZAC) 20 MG capsule Take 4 capsules (80 mg total) by mouth daily. 04/13/22   Elwanda Brooklyn, NP  levothyroxine (SYNTHROID) 75 MCG tablet Take by mouth. 09/22/21   [provider]  LINZESS 290 MCG CAPS capsule Take 290 mcg by mouth every morning. 09/29/21   [provider]  omeprazole (PRILOSEC) 40 MG capsule Take 40 mg by mouth every morning. 09/29/21   [provider]  QUEtiapine (SEROQUEL) 100 MG tablet Take 1 tablet (100 mg total) by mouth at bedtime. 04/13/22   Elwanda Brooklyn, NP  QUEtiapine (SEROQUEL) 25 MG tablet Take 2 tablets (50 mg total) by mouth at bedtime. 10/13/21   Elwanda Brooklyn, NP  temazepam (RESTORIL) 30 MG capsule Take 1 capsule (30 mg total) by mouth at bedtime. 10/13/21   Elwanda Brooklyn, NP    ___________________________________________________________________________________________________ Physical Exam:    04/21/2022    7:22 PM 04/21/2022    7:21 PM 10/13/2021    5:51 PM  Vitals with BMI  Height   4' 11"$   Weight  112 lbs 117 lbs  BMI   [id number]  Systolic [id number]  [id number]  Diastolic 80  87  Pulse [id number]  77     1. General:  in No  Acute distress    Chronically ill   -appearing 2. Psychological: Alert and    Oriented 3. Head/ENT:   Dry Mucous Membranes                          Head Non traumatic, neck supple                          Poor Dentition 4. SKIN: n decreased Skin turgor,  Skin clean Dry and intact no rash 5. Heart: Regular rate and rhythm no  Murmur, no Rub or gallop 6. Lungs:  Clear to auscultation bilaterally, no wheezes or crackles   7. Abdomen: Soft,   tender, Non distended    bowel sounds  decreased 8. Lower extremities: no clubbing, cyanosis, no  edema 9. Neurologically Grossly intact, moving all 4 extremities equally   10. MSK: Normal range of motion    Chart has been reviewed  ______________________________________________________________________________________________  Assessment/Plan 62 y.o. female with medical history significant of gastric sleeve procedure, small bowel obstruction History of anxiety   Admitted for sbo  Present on Admission:  SBO (small bowel obstruction) (HCC)  Hypothyroidism  QT prolongation     SBO (small bowel obstruction) ([facility])  Likely cause  adhesions,    - admit for conservative management  - refused NG tube - NPO - KUB in AM - appreciate General surgery consult.   Hypothyroidism Check TSH continue Levothyroxine 75 mcg a day when abe to  tolerate po  QT prolongation   - will monitor on tele avoid QT prolonging medications, rehydrate correct electrolytes   Other plan as per orders.  DVT prophylaxis:  SCD      Code Status:    Code Status: Not on file FULL CODE  as per patient   I had personally discussed CODE STATUS with patient       Family Communication:   Family not at  Bedside    Disposition Plan:        To home once workup is complete and patient is stable  Following barriers for discharge:                            Electrolytes corrected                              Sbo resolved                           Will need consultants to evaluate patient prior to discharge     Consults called:  general surgery     Admission status:  ED Disposition     ED Disposition  Admit   Condition  --   [place]: [place] [[id number]]  Level of Care: Telemetry [5]  Admit to tele based on following criteria: Monitor QTC interval  May admit patient to [facility] or [facility] if equivalent level of care is available:: No  Covid Evaluation: Asymptomatic - no recent exposure (last 10 days) testing not required  Diagnosis: SBO (small bowel obstruction) [id number]  Admitting Physician: Toy Baker [id number]]  Attending Physician: Toy Baker [id number]  Certification:: I certify this patient will need inpatient services for at least 2 midnights  Estimated Length of Stay: 2           inpatient     I Expect 2 midnight stay secondary to severity of patient's current illness need for inpatient interventions justified by the following:   Severe lab/radiological/exam abnormalities including:    SBO      That are currently affecting medical management.   I expect  patient to be hospitalized for 2 midnights requiring inpatient medical care.  Patient is at high risk for adverse outcome (such as loss of life or disability) if not treated.  Indication for inpatient stay as follows:   ,  severe pain requiring acute inpatient management,  inability to maintain oral hydration    Need for operative/procedural  intervention     Need for  IV fluids, IV pain medications,    Level of care     tele  For 12H      Booker Bhatnagar 04/21/2022, 11:16 PM    [facility]     after 2 AM please page floor coverage PA If 7AM-7PM, please contact the day team taking care of the patient using [email or web address]   Patient was evaluated in the context of the global COVID-19 pandemic, which necessitated consideration that the patient might be at risk for infection with the SARS-CoV-2 virus that causes COVID-19. Institutional protocols and algorithms that pertain to the  evaluation of patients at risk for COVID-19 are in a state of rapid change based on information released by regulatory bodies including the CDC and federal and state organizations. These policies and algorithms were followed during the patient's care.

## 2022-04-21 NOTE — ED Notes (Signed)
ED TO INPATIENT HANDOFF REPORT  ED Nurse Name and Phone #: Tonette Bihari [phone]  S Name/Age/Gender Valerie [name] 62 y.o. female Room/Bed: WA03/WA03  Code Status   Code Status: Full Code  Home/SNF/Other Home Patient oriented to: self, place, time, and situation Is this baseline? Yes   Triage Complete: Triage complete  Chief Complaint SBO (small bowel obstruction) ([place]) [id number]  Triage Note Presents from home for generalized abd pain (L>R) since Sunday with history of 5 SBO   Endorses N/V, chills  Last BM today  She takes linzess and colace daily.   Allergies Allergies  Allergen Reactions   Oxycodone-Acetaminophen Itching   Penicillins Hives    Level of Care/Admitting Diagnosis ED Disposition     ED Disposition  Admit   Condition  --   Comment  Hospital Area: [facility] [[id number]]  Level of Care: Telemetry [5]  Admit to tele based on following criteria: Monitor QTC interval  May admit patient to [facility] or [facility] if equivalent level of care is available:: No  Covid Evaluation: Asymptomatic - no recent exposure (last 10 days) testing not required  Diagnosis: SBO (small bowel obstruction) [facility]) [id number]  Admitting Physician: Toy Baker [id number]  Attending Physician: Toy Baker [id number]  Certification:: I certify this patient will need inpatient services for at least 2 midnights  Estimated Length of Stay: 2          B Medical/Surgery History Past Medical History:  Diagnosis Date   Small bowel obstruction ([place])    Past Surgical History:  Procedure Laterality Date   OTHER SURGICAL HISTORY     LINX procedure, do not insert traditional NG     A IV Location/Drains/Wounds Patient Lines/Drains/Airways Status     Active Line/Drains/Airways     Name Placement date Placement time Site Days   Peripheral IV 04/21/22 20 G Left Antecubital 04/21/22  2000  Antecubital  less than 1            Intake/Output Last  24 hours No intake or output data in the 24 hours ending 04/21/22 2242  Labs/Imaging Results for orders placed or performed during the hospital encounter of 04/21/22 (from the past 48 hour(s))  CBC with Differential     Status: Abnormal   Collection Time: 04/21/22  7:40 PM  Result Value Ref Range   WBC 11.8 (H) 4.0 - 10.5 K/uL   RBC 4.75 3.87 - 5.11 MIL/uL   Hemoglobin 14.2 12.0 - 15.0 g/dL   HCT 43.9 36.0 - 46.0 %   MCV 92.4 80.0 - 100.0 fL   MCH 29.9 26.0 - 34.0 pg   MCHC 32.3 30.0 - 36.0 g/dL   RDW 13.4 11.5 - 15.5 %   Platelets 231 150 - 400 K/uL   nRBC 0.0 0.0 - 0.2 %   Neutrophils Relative % 81 %   Neutro Abs 9.6 (H) 1.7 - 7.7 K/uL   Lymphocytes Relative 6 %   Lymphs Abs 0.7 0.7 - 4.0 K/uL   Monocytes Relative 12 %   Monocytes Absolute 1.4 (H) 0.1 - 1.0 K/uL   Eosinophils Relative 1 %   Eosinophils Absolute 0.1 0.0 - 0.5 K/uL   Basophils Relative 0 %   Basophils Absolute 0.0 0.0 - 0.1 K/uL   Immature Granulocytes 0 %   Abs Immature Granulocytes 0.04 0.00 - 0.07 K/uL    Comment: Performed at [facility], [place] [address] [place], [place] [address]  Comprehensive metabolic panel  Status: Abnormal   Collection Time: 04/21/22  7:40 PM  Result Value Ref Range   Sodium 138 135 - 145 mmol/L   Potassium 3.9 3.5 - 5.1 mmol/L   Chloride 109 98 - 111 mmol/L   CO2 18 (L) 22 - 32 mmol/L   Glucose, Bld 151 (H) 70 - 99 mg/dL    Comment: Glucose reference range applies only to samples taken after fasting for at least 8 hours.   BUN 23 8 - 23 mg/dL   Creatinine, Ser 0.98 0.44 - 1.00 mg/dL   Calcium 9.3 8.9 - 10.3 mg/dL   Total Protein 7.3 6.5 - 8.1 g/dL   Albumin 4.2 3.5 - 5.0 g/dL   AST 51 (H) 15 - 41 U/L   ALT 34 0 - 44 U/L   Alkaline Phosphatase 107 38 - 126 U/L   Total Bilirubin 0.9 0.3 - 1.2 mg/dL   GFR, Estimated >60 >60 mL/min    Comment: (NOTE) Calculated using the CKD-EPI Creatinine Equation (2021)    Anion gap 11 5 - 15    Comment: Performed  at [facility], [place] [address] [place], [place] [address]  Lipase, blood     Status: None   Collection Time: 04/21/22  7:40 PM  Result Value Ref Range   Lipase 37 11 - 51 U/L    Comment: Performed at [facility], [place] [address], [place], [place] [address]  Troponin I (High Sensitivity)     Status: None   Collection Time: 04/21/22  7:40 PM  Result Value Ref Range   Troponin I (High Sensitivity) <2 <18 ng/L    Comment: (NOTE) Elevated high sensitivity troponin I (hsTnI) values and significant  changes across serial measurements may suggest ACS but many other  chronic and acute conditions are known to elevate hsTnI results.  Refer to the "Links" section for chest pain algorithms and additional  guidance. Performed at [facility], [place] [address] [place], [place] [address]    CT ABDOMEN PELVIS W CONTRAST  Result Date: 04/21/2022 CLINICAL DATA:  Generalized abdominal pain left greater than right EXAM: CT ABDOMEN AND PELVIS WITH CONTRAST TECHNIQUE: Multidetector CT imaging of the abdomen and pelvis was performed using the standard protocol following bolus administration of intravenous contrast. RADIATION DOSE REDUCTION: This exam was performed according to the departmental dose-optimization program which includes automated exposure control, adjustment of the mA and/or kV according to patient size and/or use of iterative reconstruction technique. CONTRAST:  55m OMNIPAQUE IOHEXOL 300 MG/ML  SOLN COMPARISON:  None Available. FINDINGS: Lower chest: No acute pleural or parenchymal lung disease. Hepatobiliary: No focal liver abnormality is seen. Status post cholecystectomy. No biliary dilatation. Pancreas: Unremarkable. No pancreatic ductal dilatation or surrounding inflammatory changes. Spleen: Normal in size without focal abnormality. Adrenals/Urinary Tract: Multiple nonobstructing left renal calculi, largest measuring 6 mm. Mild cortical  scarring upper pole right kidney. Scattered subcentimeter hypodensities throughout the kidneys are too small to characterize but likely reflect small cysts. No specific imaging follow-up is required. The adrenals and bladder are unremarkable. Stomach/Bowel: Postsurgical changes are seen from previous gastric bariatric surgery as well as multiple small bowel resections and reanastomosis. Multiple dilated loops of mid jejunum are identified, measuring up to 3.2 cm in diameter with scattered gas fluid levels. The distal jejunum and ileum are decompressed, with transition point in the right lower quadrant reference image 51/2. There is moderate distention of the colon with diffuse gas fluid levels. This could reflect diarrhea  or recent laxative/enema use. No bowel wall thickening or inflammatory change. Vascular/Lymphatic: Aortic atherosclerosis. Subcentimeter lymph nodes within the central mesentery are likely reactive. No pathologic adenopathy. Reproductive: Status post hysterectomy. No adnexal masses. Other: No free fluid or free intraperitoneal gas. No abdominal wall hernia. Musculoskeletal: No acute or destructive bony lesions. Reconstructed images demonstrate no additional findings. IMPRESSION: 1. Mild proximal jejunal distension and scattered gas fluid levels, consistent with early or intermittent small bowel obstruction. 2. Gas fluid levels throughout the colon, which may reflect diarrhea or recent laxative/enema use. 3. Reactive subcentimeter central mesenteric lymph nodes. No pathologic adenopathy. 4. Nonobstructing left renal calculi, largest measuring 6 mm. 5.  Aortic Atherosclerosis (ICD10-I70.0). Electronically Signed   By: Randa Ngo M.D.   On: 04/21/2022 21:09    Pending Labs Unresulted Labs (From admission, onward)     Start     Ordered   04/22/22 0500  Prealbumin  Tomorrow morning,   R        04/21/22 2224   04/21/22 2225  CK  Add-on,   AD        04/21/22 2224   04/21/22 2225  Magnesium   Add-on,   AD        04/21/22 2224   04/21/22 2225  Phosphorus  Add-on,   AD        04/21/22 2224   04/21/22 2225  Lactic acid, plasma  STAT Now then every 3 hours,   R (with STAT occurrences)     Question:  Release to patient  Answer:  Immediate   04/21/22 2224   04/21/22 2225  TSH  Add-on,   AD        04/21/22 2224   04/21/22 2225  Urinalysis, Complete w Microscopic -Urine, Clean Catch  Once,   R       Question Answer Comment  Release to patient Immediate   Specimen Source Urine, Clean Catch      04/21/22 2224   04/21/22 1944  Urinalysis, Routine w reflex microscopic -Urine, Clean Catch  Once,   URGENT       Question:  Specimen Source  Answer:  Urine, Clean Catch   04/21/22 1943   Signed and Held  HIV Antibody (routine testing w rflx)  (HIV Antibody (Routine testing w reflex) panel)  Once,   R        Signed and Held   Signed and Held  Comprehensive metabolic panel  Tomorrow morning,   R       Question:  Release to patient  Answer:  Immediate   Signed and Held   Signed and Held  CBC  Tomorrow morning,   R       Question:  Release to patient  Answer:  Immediate   Signed and Held   Signed and Held  Magnesium  Tomorrow morning,   R        Signed and Held   Signed and Held  Phosphorus  Tomorrow morning,   R        Signed and Held            Vitals/Pain Today's Vitals   04/21/22 1921 04/21/22 1922  BP:  127/80  Pulse:  (!) 110  Resp:  19  Temp:  97.8 F (36.6 C)  TempSrc:  Oral  SpO2:  100%  Weight: 50.8 kg   PainSc: 7      Isolation Precautions No active isolations  Medications Medications  lactated ringers infusion ( Intravenous New Bag/Given  04/21/22 2204)  ondansetron (ZOFRAN) injection 4 mg (4 mg Intravenous Given 04/21/22 2000)  lactated ringers bolus 1,000 mL (1,000 mLs Intravenous Bolus 04/21/22 2001)  morphine (PF) 4 MG/ML injection 4 mg (4 mg Intravenous Given 04/21/22 2000)  iohexol (OMNIPAQUE) 300 MG/ML solution 80 mL (80 mLs Intravenous Contrast Given  04/21/22 2051)    Mobility walks     Focused Assessments    R Recommendations: See Admitting Provider Note  Report given to:   Additional Notes:

## 2022-04-21 NOTE — Subjective & Objective (Signed)
Abd pain for the past 3 days, hx of gastric sleeve,  Hx of SBO in the past N/v and chills  Had a BM today patient has limited her p.o. intake today because she was concerned she has small bowel obstruction.  As she did before. Denies any chest pain or shortness of breath

## 2022-04-21 NOTE — ED Triage Notes (Signed)
Presents from home for generalized abd pain (L>R) since Sunday with history of 5 SBO   Endorses N/V, chills  Last BM today  She takes linzess and colace daily.

## 2022-04-21 NOTE — Assessment & Plan Note (Signed)
Likely cause  adhesions,    - admit for conservative management  - refused NG tube - NPO - KUB in AM - appreciate General surgery consult.

## 2022-04-21 NOTE — ED Provider Notes (Signed)
[facility] Provider Note   CSN: [id number] Arrival date & time: 04/21/22  1916     History Chief Complaint  Patient presents with   Abdominal Pain    HPI Valerie [name] is a 62 y.o. female presenting for chief complaint of abdominal pain.  She is a 62 year old female with an extensive medical history.  Endorses a history of gastric sleeve, sepsis, recurrent small bowel obstructions who is presenting with severe intolerance to p.o. intake since yesterday.  She states that this feels like another recurrent obstruction.  She is history of obstruction secondary to adhesions prior surgeries etc. No p.o. intake today.  Denies chest pain or shortness of breath.  Otherwise ambulatory.  Felt fine on Saturday with no symptoms..   Patient's recorded medical, surgical, social, medication list and allergies were reviewed in the Snapshot window as part of the initial history.   Review of Systems   Review of Systems  Constitutional:  Negative for chills and fever.  HENT:  Negative for ear pain and sore throat.   Eyes:  Negative for pain and visual disturbance.  Respiratory:  Negative for cough and shortness of breath.   Cardiovascular:  Negative for chest pain and palpitations.  Gastrointestinal:  Positive for abdominal pain, nausea and vomiting.  Genitourinary:  Negative for dysuria and hematuria.  Musculoskeletal:  Negative for arthralgias and back pain.  Skin:  Negative for color change and rash.  Neurological:  Negative for seizures and syncope.  All other systems reviewed and are negative.   Physical Exam Updated Vital Signs BP 127/80   Pulse 88   Temp 97.8 F (36.6 C) (Oral)   Resp 12   Wt 50.8 kg   SpO2 96%   BMI 22.62 kg/m  Physical Exam Vitals and nursing note reviewed.  Constitutional:      General: She is not in acute distress.    Appearance: She is well-developed.  HENT:     Head: Normocephalic and atraumatic.  Eyes:      Conjunctiva/sclera: Conjunctivae normal.  Cardiovascular:     Rate and Rhythm: Normal rate and regular rhythm.     Heart sounds: No murmur heard. Pulmonary:     Effort: Pulmonary effort is normal. No respiratory distress.     Breath sounds: Normal breath sounds.  Abdominal:     General: There is no distension.     Palpations: Abdomen is soft.     Tenderness: There is abdominal tenderness in the right lower quadrant, periumbilical area, suprapubic area and left lower quadrant. There is no right CVA tenderness, left CVA tenderness or guarding.  Musculoskeletal:        General: No swelling or tenderness. Normal range of motion.     Cervical back: Neck supple.  Skin:    General: Skin is warm and dry.  Neurological:     General: No focal deficit present.     Mental Status: She is alert and oriented to person, place, and time. Mental status is at baseline.     Cranial Nerves: No cranial nerve deficit.      ED Course/ Medical Decision Making/ A&P    Procedures Procedures   Medications Ordered in ED Medications  lactated ringers infusion ( Intravenous New Bag/Given 04/21/22 2204)  0.9 %  sodium chloride infusion (has no administration in time range)  acetaminophen (TYLENOL) tablet 650 mg (has no administration in time range)    Or  acetaminophen (TYLENOL) suppository 650 mg (has no  administration in time range)  HYDROmorphone (DILAUDID) injection 0.5-1 mg (has no administration in time range)  metoCLOPramide (REGLAN) injection 5 mg (has no administration in time range)  LORazepam (ATIVAN) injection 0.5 mg (has no administration in time range)  ondansetron (ZOFRAN) injection 4 mg (4 mg Intravenous Given 04/21/22 2000)  lactated ringers bolus 1,000 mL (1,000 mLs Intravenous Bolus 04/21/22 2001)  morphine (PF) 4 MG/ML injection 4 mg (4 mg Intravenous Given 04/21/22 2000)  iohexol (OMNIPAQUE) 300 MG/ML solution 80 mL (80 mLs Intravenous Contrast Given 04/21/22 2051)    Medical  Decision Making:   Valerie [name] is a 62 y.o. female who presented to the ED today with abdominal pain, detailed above.    Patient's presentation is complicated by their history of recurrent small bowel obstruction.  Patient placed on continuous vitals and telemetry monitoring while in ED which was reviewed periodically.  Complete initial physical exam performed, notably the patient  was hemodynamically stable in no acute distress.     Reviewed and confirmed nursing documentation for past medical history, family history, social history.    Initial Assessment:   With the patient's presentation of abdominal pain, most likely diagnosis is nonspecific etiology. Other diagnoses were considered including (but not limited to) gastroenteritis, colitis, small bowel obstruction, appendicitis, cholecystitis, pancreatitis, nephrolithiasis, UTI, pyleonephritis. These are considered less likely due to history of present illness and physical exam findings.   This is most consistent with an acute life/limb threatening illness complicated by underlying chronic conditions.   Initial Plan:  CBC/CMP to evaluate for underlying infectious/metabolic etiology for patient's abdominal pain  Lipase to evaluate for pancreatitis  EKG to evaluate for cardiac source of pain  CTAB/Pelvis with contrast to evaluate for structural/surgical etiology of patients' severe abdominal pain.  Urinalysis and repeat physical assessment to evaluate for UTI/Pyelonpehritis  Empiric management of symptoms with escalating pain control and antiemetics as needed.   Initial Study Results:   Laboratory  All laboratory results reviewed without evidence of clinically relevant pathology.    EKG EKG was reviewed independently. Rate, rhythm, axis, intervals all examined and without medically relevant abnormality. ST segments without concerns for elevations.    Radiology All images reviewed independently. Agree with radiology report at this  time.   CT ABDOMEN PELVIS W CONTRAST  Result Date: 04/21/2022 CLINICAL DATA:  Generalized abdominal pain left greater than right EXAM: CT ABDOMEN AND PELVIS WITH CONTRAST TECHNIQUE: Multidetector CT imaging of the abdomen and pelvis was performed using the standard protocol following bolus administration of intravenous contrast. RADIATION DOSE REDUCTION: This exam was performed according to the departmental dose-optimization program which includes automated exposure control, adjustment of the mA and/or kV according to patient size and/or use of iterative reconstruction technique. CONTRAST:  82m OMNIPAQUE IOHEXOL 300 MG/ML  SOLN COMPARISON:  None Available. FINDINGS: Lower chest: No acute pleural or parenchymal lung disease. Hepatobiliary: No focal liver abnormality is seen. Status post cholecystectomy. No biliary dilatation. Pancreas: Unremarkable. No pancreatic ductal dilatation or surrounding inflammatory changes. Spleen: Normal in size without focal abnormality. Adrenals/Urinary Tract: Multiple nonobstructing left renal calculi, largest measuring 6 mm. Mild cortical scarring upper pole right kidney. Scattered subcentimeter hypodensities throughout the kidneys are too small to characterize but likely reflect small cysts. No specific imaging follow-up is required. The adrenals and bladder are unremarkable. Stomach/Bowel: Postsurgical changes are seen from previous gastric bariatric surgery as well as multiple small bowel resections and reanastomosis. Multiple dilated loops of mid jejunum are identified, measuring up to 3.2 cm in  diameter with scattered gas fluid levels. The distal jejunum and ileum are decompressed, with transition point in the right lower quadrant reference image 51/2. There is moderate distention of the colon with diffuse gas fluid levels. This could reflect diarrhea or recent laxative/enema use. No bowel wall thickening or inflammatory change. Vascular/Lymphatic: Aortic atherosclerosis.  Subcentimeter lymph nodes within the central mesentery are likely reactive. No pathologic adenopathy. Reproductive: Status post hysterectomy. No adnexal masses. Other: No free fluid or free intraperitoneal gas. No abdominal wall hernia. Musculoskeletal: No acute or destructive bony lesions. Reconstructed images demonstrate no additional findings. IMPRESSION: 1. Mild proximal jejunal distension and scattered gas fluid levels, consistent with early or intermittent small bowel obstruction. 2. Gas fluid levels throughout the colon, which may reflect diarrhea or recent laxative/enema use. 3. Reactive subcentimeter central mesenteric lymph nodes. No pathologic adenopathy. 4. Nonobstructing left renal calculi, largest measuring 6 mm. 5.  Aortic Atherosclerosis (ICD10-I70.0). Electronically Signed   By: Randa Ngo M.D.   On: 04/21/2022 21:09     Consulted general surgery Dr. Georgette Dover who recommended no further interventional care at this time.  Patient has declined NG tube because of history of severe negative reaction and severe discomfort. Pain is currently under control. Consulted hospital medicine for observation overnight for repeat assessment on IV fluids and bowel rest and Dr. Roel Cluck responded agreeing with need for admission.  Patient admitted with no further acute events  Clinical Impression:  1. Generalized abdominal pain      Admit   Final Clinical Impression(s) / ED Diagnoses Final diagnoses:  Generalized abdominal pain    Rx / DC Orders ED Discharge Orders     None         Tretha Sciara, MD 04/21/22 2318

## 2022-04-21 NOTE — Assessment & Plan Note (Signed)
-   will monitor on tele avoid QT prolonging medications, rehydrate correct electrolytes ? ?

## 2022-04-21 NOTE — Assessment & Plan Note (Addendum)
Check TSH continue Levothyroxine 75 mcg a day when abe to  tolerate po

## 2022-04-22 ENCOUNTER — Inpatient Hospital Stay (HOSPITAL_COMMUNITY): Payer: Medicare Other

## 2022-04-22 DIAGNOSIS — K56609 Unspecified intestinal obstruction, unspecified as to partial versus complete obstruction: Secondary | ICD-10-CM | POA: Diagnosis not present

## 2022-04-22 LAB — URINALYSIS, COMPLETE (UACMP) WITH MICROSCOPIC
Bacteria, UA: NONE SEEN
Bilirubin Urine: NEGATIVE
Glucose, UA: NEGATIVE mg/dL
Hgb urine dipstick: NEGATIVE
Ketones, ur: NEGATIVE mg/dL
Nitrite: NEGATIVE
Protein, ur: NEGATIVE mg/dL
Specific Gravity, Urine: >1.046 — ABNORMAL HIGH (ref 1.005–1.030)
pH: 5 (ref 5.0–8.0)

## 2022-04-22 LAB — COMPREHENSIVE METABOLIC PANEL
ALT: 50 U/L — ABNORMAL HIGH (ref 0–44)
AST: 84 U/L — ABNORMAL HIGH (ref 15–41)
Albumin: 2.9 g/dL — ABNORMAL LOW (ref 3.5–5.0)
Alkaline Phosphatase: 95 U/L (ref 38–126)
Anion gap: 6 (ref 5–15)
BUN: 19 mg/dL (ref 8–23)
CO2: 20 mmol/L — ABNORMAL LOW (ref 22–32)
Calcium: 8.1 mg/dL — ABNORMAL LOW (ref 8.9–10.3)
Chloride: 111 mmol/L (ref 98–111)
Creatinine, Ser: 0.68 mg/dL (ref 0.44–1.00)
GFR, Estimated: >60 mL/min (ref >60)
Glucose, Bld: 116 mg/dL — ABNORMAL HIGH (ref 70–99)
Potassium: 3.7 mmol/L (ref 3.5–5.1)
Sodium: 137 mmol/L (ref 135–145)
Total Bilirubin: 0.8 mg/dL (ref 0.3–1.2)
Total Protein: 5.5 g/dL — ABNORMAL LOW (ref 6.5–8.1)

## 2022-04-22 LAB — LACTIC ACID, PLASMA
Lactic Acid, Venous: 0.9 mmol/L (ref 0.5–1.9)
Lactic Acid, Venous: 2.2 mmol/L (ref 0.5–1.9)

## 2022-04-22 LAB — CBC
HCT: 31.9 % — ABNORMAL LOW (ref 36.0–46.0)
Hemoglobin: 10.7 g/dL — ABNORMAL LOW (ref 12.0–15.0)
MCH: 30.7 pg (ref 26.0–34.0)
MCHC: 33.5 g/dL (ref 30.0–36.0)
MCV: 91.4 fL (ref 80.0–100.0)
Platelets: 167 10*3/uL (ref 150–400)
RBC: 3.49 MIL/uL — ABNORMAL LOW (ref 3.87–5.11)
RDW: 13.5 % (ref 11.5–15.5)
WBC: 7.1 10*3/uL (ref 4.0–10.5)
nRBC: 0 % (ref 0.0–0.2)

## 2022-04-22 LAB — PHOSPHORUS
Phosphorus: 3.1 mg/dL (ref 2.5–4.6)
Phosphorus: 3.3 mg/dL (ref 2.5–4.6)

## 2022-04-22 LAB — TROPONIN I (HIGH SENSITIVITY): Troponin I (High Sensitivity): <2 ng/L (ref <18)

## 2022-04-22 LAB — MAGNESIUM
Magnesium: 1.6 mg/dL — ABNORMAL LOW (ref 1.7–2.4)
Magnesium: 2.7 mg/dL — ABNORMAL HIGH (ref 1.7–2.4)

## 2022-04-22 LAB — CK: Total CK: 23 U/L — ABNORMAL LOW (ref 38–234)

## 2022-04-22 LAB — TSH: TSH: 1.318 u[IU]/mL (ref 0.350–4.500)

## 2022-04-22 LAB — HIV ANTIBODY (ROUTINE TESTING W REFLEX): HIV Screen 4th Generation wRfx: NONREACTIVE

## 2022-04-22 LAB — PREALBUMIN: Prealbumin: 14 mg/dL — ABNORMAL LOW (ref 18–38)

## 2022-04-22 MED ORDER — ORAL CARE MOUTH RINSE: 15.0000 mL | OROMUCOSAL | Status: DC | PRN

## 2022-04-22 MED ORDER — HYDROMORPHONE HCL 1 MG/ML IJ SOLN: 0.5000 mg | INTRAMUSCULAR | Status: DC | PRN

## 2022-04-22 MED ORDER — LORAZEPAM 2 MG/ML PO CONC
0.5000 mg | Freq: Once | ORAL | Status: AC
  Administered 2022-04-22: 0.5 mg via ORAL
  Filled 2022-04-22: qty 1

## 2022-04-22 MED ORDER — DIATRIZOATE MEGLUMINE & SODIUM 66-10 % PO SOLN
90.0000 mL | Freq: Once | ORAL | Status: AC
  Administered 2022-04-22: 90 mL via NASOGASTRIC
  Filled 2022-04-22: qty 90

## 2022-04-22 MED ORDER — MAGNESIUM SULFATE 2 GM/50ML IV SOLN
2.0000 g | Freq: Once | INTRAVENOUS | Status: AC
  Administered 2022-04-22: 2 g via INTRAVENOUS
  Filled 2022-04-22: qty 50

## 2022-04-22 MED ORDER — BUTAMBEN-TETRACAINE-BENZOCAINE 2-2-14 % EX AERO
1.0000 | INHALATION_SPRAY | Freq: Once | CUTANEOUS | Status: AC
  Administered 2022-04-22: 1 via TOPICAL
  Filled 2022-04-22: qty 20

## 2022-04-22 MED ORDER — LACTATED RINGERS IV BOLUS
250.0000 mL | Freq: Once | INTRAVENOUS | Status: AC
  Administered 2022-04-22: 250 mL via INTRAVENOUS

## 2022-04-22 MED ORDER — LIDOCAINE VISCOUS HCL 2 % MT SOLN
15.0000 mL | Freq: Once | OROMUCOSAL | Status: AC
  Administered 2022-04-22: 15 mL via OROMUCOSAL
  Filled 2022-04-22: qty 15

## 2022-04-22 MED ORDER — HYDROMORPHONE HCL 1 MG/ML IJ SOLN
1.0000 mg | INTRAMUSCULAR | Status: DC | PRN
  Administered 2022-04-22 – 2022-04-23 (×6): 1 mg via INTRAVENOUS
  Filled 2022-04-22 (×6): qty 1

## 2022-04-22 MED ORDER — SODIUM CHLORIDE 0.9 % IV SOLN: INTRAVENOUS | Status: DC

## 2022-04-22 NOTE — Progress Notes (Signed)
PROGRESS NOTE  Valerie [name]  [id number] DOB: November 24, 1960 DOA: 04/21/2022 PCP: Caren Macadam, MD   Brief Narrative: Patient is a 62 year old female with history of gastric sleeve procedure, SBO, anxiety who presented here with complaint of nausea, vomiting, abdominal pain for 3 days.  CT abdomen/pelvis on admission showed mild proximal abdominal distention, scattered gas/fluid level consistent with early or intermittent small bowel obstruction.  General surgery following.  Currently on conservative management  Assessment & Plan:  Principal Problem:   SBO (small bowel obstruction) (HCC) Active Problems:   Hypothyroidism   QT prolongation  SBO: Presented with nausea, vomiting, abdominal pain.  CT imaging as above.  Started on conservative management  plan with IV fluid, n.p.o. status, NG tube.  General surgery closely following.Radiology requested to pull the NG tube fluoroscopically because of history of Linx procedure History of SBO in the past.  Multiple prior abdominal surgeries including TAH, gastric sleeve.  Most likely this is associated with adhesions.  Hypothyroidism: On levothyroxine  Elevated liver enzymes: Mild, unclear etiology.  Continue to monitor  Elevated lactic acid level/leukocytosis: Resolved         DVT prophylaxis:SCDs Start: 04/21/22 2306     Code Status: Full Code  Family Communication: None at bedside  Patient status:Inpatient  Patient is from :Home  Anticipated discharge NE:[id number]  Estimated DC date:1-2 days   Consultants: General surgery  Procedures:None  Antimicrobials:  Anti-infectives (From admission, onward)    None       Subjective: Patient seen and examined at bedside today.  Hemodynamically stable.  Complains of severe abdominal pain which is generalized.  No nausea or vomiting.  Not passing gas  Objective: Vitals:   04/21/22 2245 04/21/22 2351 04/22/22 0424 04/22/22 0826  BP:  113/69 92/60 (!) 99/53  Pulse: 88 76 81  85  Resp: 12 14 14 18  $ Temp:  98.3 F (36.8 C) 98.5 F (36.9 C) 99.2 F (37.3 C)  TempSrc:  Oral Oral Oral  SpO2: 96% 95% 95% 92%  Weight:        Intake/Output Summary (Last 24 hours) at 04/22/2022 1245 Last data filed at 04/22/2022 0830 Gross per 24 hour  Intake 610.59 ml  Output 300 ml  Net 310.59 ml   Filed Weights   04/21/22 1921  Weight: 50.8 kg    Examination:  General exam:Not in distress HEENT: PERRL Respiratory system:  no wheezes or crackles  Cardiovascular system: S1 & S2 heard, RRR.  Gastrointestinal system: Abdomen is nondistended, soft , has generalized tenderness, bowel sounds not heard Central nervous system: Alert and oriented Extremities: No edema, no clubbing ,no cyanosis Skin: No rashes, no ulcers,no icterus     Data Reviewed: I have personally reviewed following labs and imaging studies  CBC: Recent Labs  Lab 04/21/22 1940 04/22/22 0152  WBC 11.8* 7.1  NEUTROABS 9.6*  --   HGB 14.2 10.7*  HCT 43.9 31.9*  MCV 92.4 91.4  PLT 231 [id number]   Basic Metabolic Panel: Recent Labs  Lab 04/21/22 1940 04/21/22 2353 04/22/22 0152  NA 138  --  137  K 3.9  --  3.7  CL 109  --  111  CO2 18*  --  20*  GLUCOSE 151*  --  116*  BUN 23  --  19  CREATININE 0.98  --  0.68  CALCIUM 9.3  --  8.1*  MG  --  1.6* 2.7*  PHOS  --  3.3 3.1     No results found for this  or any previous visit (from the past 240 hour(s)).   Radiology Studies: DG Abd 1 View  Result Date: 04/22/2022 CLINICAL DATA:  [id number] with small-bowel obstruction. EXAM: ABDOMEN - 1 VIEW COMPARISON:  CT with contrast yesterday. FINDINGS: Small bowel dilatation continues to be seen in the mid to lower abdomen up to 4.4 cm. Postsurgical changes are again noted of the stomach, and cholecystectomy. Multiple nonobstructive stones in left kidney were better demonstrated on CT as well as punctate stones on the right. There is contrast in the bladder.  No new abnormality. IMPRESSION: 1. Persistent  small-bowel obstruction. 2. Bilateral renal calyceal stones, better demonstrated on CT. 3. Postsurgical changes of the stomach and cholecystectomy. Electronically Signed   By: Telford Nab M.D.   On: 04/22/2022 07:43   CT ABDOMEN PELVIS W CONTRAST  Result Date: 04/21/2022 CLINICAL DATA:  Generalized abdominal pain left greater than right EXAM: CT ABDOMEN AND PELVIS WITH CONTRAST TECHNIQUE: Multidetector CT imaging of the abdomen and pelvis was performed using the standard protocol following bolus administration of intravenous contrast. RADIATION DOSE REDUCTION: This exam was performed according to the departmental dose-optimization program which includes automated exposure control, adjustment of the mA and/or kV according to patient size and/or use of iterative reconstruction technique. CONTRAST:  73m OMNIPAQUE IOHEXOL 300 MG/ML  SOLN COMPARISON:  None Available. FINDINGS: Lower chest: No acute pleural or parenchymal lung disease. Hepatobiliary: No focal liver abnormality is seen. Status post cholecystectomy. No biliary dilatation. Pancreas: Unremarkable. No pancreatic ductal dilatation or surrounding inflammatory changes. Spleen: Normal in size without focal abnormality. Adrenals/Urinary Tract: Multiple nonobstructing left renal calculi, largest measuring 6 mm. Mild cortical scarring upper pole right kidney. Scattered subcentimeter hypodensities throughout the kidneys are too small to characterize but likely reflect small cysts. No specific imaging follow-up is required. The adrenals and bladder are unremarkable. Stomach/Bowel: Postsurgical changes are seen from previous gastric bariatric surgery as well as multiple small bowel resections and reanastomosis. Multiple dilated loops of mid jejunum are identified, measuring up to 3.2 cm in diameter with scattered gas fluid levels. The distal jejunum and ileum are decompressed, with transition point in the right lower quadrant reference image 51/2. There is  moderate distention of the colon with diffuse gas fluid levels. This could reflect diarrhea or recent laxative/enema use. No bowel wall thickening or inflammatory change. Vascular/Lymphatic: Aortic atherosclerosis. Subcentimeter lymph nodes within the central mesentery are likely reactive. No pathologic adenopathy. Reproductive: Status post hysterectomy. No adnexal masses. Other: No free fluid or free intraperitoneal gas. No abdominal wall hernia. Musculoskeletal: No acute or destructive bony lesions. Reconstructed images demonstrate no additional findings. IMPRESSION: 1. Mild proximal jejunal distension and scattered gas fluid levels, consistent with early or intermittent small bowel obstruction. 2. Gas fluid levels throughout the colon, which may reflect diarrhea or recent laxative/enema use. 3. Reactive subcentimeter central mesenteric lymph nodes. No pathologic adenopathy. 4. Nonobstructing left renal calculi, largest measuring 6 mm. 5.  Aortic Atherosclerosis (ICD10-I70.0). Electronically Signed   By: MRanda NgoM.D.   On: 04/21/2022 21:09    Scheduled Meds: Continuous Infusions:   LOS: 1 day   AShelly Coss MD [facility] P2/21/2024, 12:45 PM

## 2022-04-22 NOTE — Consult Note (Signed)
Valerie [name] [birth year]  GX:[id number].    Requesting MD: Dr. Shelly Coss Chief Complaint/Reason for Consult: SBO  HPI: Valerie [name] is a 62 y.o. female who presented with abdominal pain, n/v. Follows with GI and is on Linzess and Colace daily for bowel regimen at baseline. Normally has 1 bm a day. Symptoms started Sunday as generalized pain and bloating. Reports 8-9 bm's Sunday and Monday. Tuesday her pain/bloating persisted but she developed nausea, dry heaves and vomiting. Last BM 2/20 which was very small. Last flatus 2/20 pm. Reports this feels similar to prior SBO's. Denies fever.   Reports hx of multiple prior abdominal surgeries. Had TAH that was complicated by bowel perforation. Subsequently had at least 5 sbo's, all requiring ex-lap, with at least one SBR. Hx of sleeve gastrectomy by Dr. Kathalene Frames at [facility] in [place], [place] in 2017. Per notes, when she went to have her bariatric surgery the operation that she desired was a gastric bypass but this was unable to be done because of adhesions. Developed a hiatal hernia and GERD after her gastric sleeve procedure and a Linx procedure was performed in ~2020. Also hx of Lap Chole. All SBO's were before gastric sleeve and linx procedure. She saw Dr. Hassell Done earlier this year for ongoing reflux. Was recommended for medical management after discussing with her surgeon in [place] given high risk with hx of intra-abdominal adhesions (she reports bypass required at least 4 hours of LOA).   In the ED she was afebrile. Initially tachycardic that resolved with IVF. BP soft, with last pressure [id number] but no systolic hypotension. CT w/ mild proximal jejunal distension and scattered gas fluid levels with transition in RLQ consistent with early or intermittent small bowel obstruction. There was also gas fluid levels throughout the colon. WBC 11.8 > 7.1. Lactic 2.2 > 0.9. [facility] admitted. We were asked to see. No NGT placed. Reports with hx of Linx  procedure NGT always has to be placed fluoroscopically.  Xray this am with persistent sbo like changes. She has ongoing generalized abdominal pain since admission however this is improved from a 10/10 to a 5/10 after pain medication. Also notes ongoing nausea despite anti-emetics. No vomiting, flatus or bm since admission.    Other medical hx includes pituitary adenoma s/p surgery and thyroidectomy. She denies tobacco or alcohol use. She is not on blood thinners.   ROS: ROS As above, see hpi  Family History  Problem Relation Age of Onset   Breast cancer Neg Hx     Past Medical History:  Diagnosis Date   Small bowel obstruction (HCC)     Past Surgical History:  Procedure Laterality Date   OTHER SURGICAL HISTORY     LINX procedure, do not insert traditional NG    Social History:  reports that she has never smoked. She has never used smokeless tobacco. She reports that she does not drink alcohol and does not use drugs.  Allergies:  Allergies  Allergen Reactions   Oxycodone-Acetaminophen Itching   Penicillins Hives    Did it involve swelling of the face/tongue/throat, SOB, or low BP? No Did it involve sudden or severe rash/hives, skin peeling, or any reaction on the inside of your mouth or nose? Yes Did you need to seek medical attention at a hospital or doctor's office? No When did it last happen?      Over 31 Years Ago If all above answers are "NO", may proceed with cephalosporin use.  Medications Prior to Admission  Medication Sig Dispense Refill   ALPRAZolam (XANAX) 0.5 MG tablet Take 1 tablet (0.5 mg total) by mouth 2 (two) times daily as needed. (Patient taking differently: Take 0.5 mg by mouth 2 (two) times daily as needed for anxiety.) 180 tablet 0   docusate sodium (COLACE) 100 MG capsule Take 100 mg by mouth daily.     FLUoxetine (PROZAC) 20 MG capsule Take 4 capsules (80 mg total) by mouth daily. [id number] capsule 3   folic acid (FOLVITE) 1 MG tablet Take 1 mg by  mouth daily.     levothyroxine (SYNTHROID) 75 MCG tablet Take 75 mcg by mouth daily before breakfast.     LINZESS 290 MCG CAPS capsule Take 290 mcg by mouth every morning.     omeprazole (PRILOSEC) 40 MG capsule Take 40 mg by mouth daily as needed (heartburn).     QUEtiapine (SEROQUEL) 100 MG tablet Take 1 tablet (100 mg total) by mouth at bedtime. 30 tablet 3   senna (SENOKOT) 8.6 MG TABS tablet Take 4 tablets by mouth in the morning.     temazepam (RESTORIL) 30 MG capsule Take 1 capsule (30 mg total) by mouth at bedtime. 90 capsule 1   QUEtiapine (SEROQUEL) 25 MG tablet Take 2 tablets (50 mg total) by mouth at bedtime. (Patient not taking: Reported on 04/21/2022) 180 tablet 1     Physical Exam: Blood pressure 92/60, pulse 81, temperature 98.5 F (36.9 C), temperature source Oral, resp. rate 14, weight 50.8 kg, SpO2 95 %. General: pleasant, WD/WN female who is laying in bed in NAD HEENT: head is normocephalic, atraumatic.  Sclera are noninjected.  PERRL.  Ears and nose without any masses or lesions.  Mouth is pink and moist. Dentition fair Heart: regular, rate, and rhythm.  Lungs: CTAB, no wheezes, rhonchi, or rales noted.  Respiratory effort nonlabored Abd:  Soft, mild distension, generalized ttp without involuntary guarding or rigidity. Hypoactive BS. Prior pfannenstiel , lower midline and laparoscopic incisions are all well healed. No obvious hernia.  MS: no BUE or BLE edema, SCDs in place  Skin: warm and dry  Psych: A&Ox4 with an appropriate affect Neuro: cranial nerves grossly intact, normal speech, thought process intact, moves all extremities, gait not assessed   Results for orders placed or performed during the hospital encounter of 04/21/22 (from the past 48 hour(s))  CBC with Differential     Status: Abnormal   Collection Time: 04/21/22  7:40 PM  Result Value Ref Range   WBC 11.8 (H) 4.0 - 10.5 K/uL   RBC 4.75 3.87 - 5.11 MIL/uL   Hemoglobin 14.2 12.0 - 15.0 g/dL   HCT 43.9  36.0 - 46.0 %   MCV 92.4 80.0 - 100.0 fL   MCH 29.9 26.0 - 34.0 pg   MCHC 32.3 30.0 - 36.0 g/dL   RDW 13.4 11.5 - 15.5 %   Platelets 231 150 - 400 K/uL   nRBC 0.0 0.0 - 0.2 %   Neutrophils Relative % 81 %   Neutro Abs 9.6 (H) 1.7 - 7.7 K/uL   Lymphocytes Relative 6 %   Lymphs Abs 0.7 0.7 - 4.0 K/uL   Monocytes Relative 12 %   Monocytes Absolute 1.4 (H) 0.1 - 1.0 K/uL   Eosinophils Relative 1 %   Eosinophils Absolute 0.1 0.0 - 0.5 K/uL   Basophils Relative 0 %   Basophils Absolute 0.0 0.0 - 0.1 K/uL   Immature Granulocytes 0 %   Abs Immature  Granulocytes 0.04 0.00 - 0.07 K/uL    Comment: Performed at [facility], [place] [address] [place], [place] [address]  Comprehensive metabolic panel     Status: Abnormal   Collection Time: 04/21/22  7:40 PM  Result Value Ref Range   Sodium 138 135 - 145 mmol/L   Potassium 3.9 3.5 - 5.1 mmol/L   Chloride 109 98 - 111 mmol/L   CO2 18 (L) 22 - 32 mmol/L   Glucose, Bld 151 (H) 70 - 99 mg/dL    Comment: Glucose reference range applies only to samples taken after fasting for at least 8 hours.   BUN 23 8 - 23 mg/dL   Creatinine, Ser 0.98 0.44 - 1.00 mg/dL   Calcium 9.3 8.9 - 10.3 mg/dL   Total Protein 7.3 6.5 - 8.1 g/dL   Albumin 4.2 3.5 - 5.0 g/dL   AST 51 (H) 15 - 41 U/L   ALT 34 0 - 44 U/L   Alkaline Phosphatase 107 38 - 126 U/L   Total Bilirubin 0.9 0.3 - 1.2 mg/dL   GFR, Estimated >60 >60 mL/min    Comment: (NOTE) Calculated using the CKD-EPI Creatinine Equation (2021)    Anion gap 11 5 - 15    Comment: Performed at [facility], [place] [address] [place], [place] [address]  Lipase, blood     Status: None   Collection Time: 04/21/22  7:40 PM  Result Value Ref Range   Lipase 37 11 - 51 U/L    Comment: Performed at [facility], [place] [address], [place], [place] [address]  Troponin I (High Sensitivity)     Status: None   Collection Time: 04/21/22  7:40 PM  Result Value Ref Range    Troponin I (High Sensitivity) <2 <18 ng/L    Comment: (NOTE) Elevated high sensitivity troponin I (hsTnI) values and significant  changes across serial measurements may suggest ACS but many other  chronic and acute conditions are known to elevate hsTnI results.  Refer to the "Links" section for chest pain algorithms and additional  guidance. Performed at [facility], [place] [address], [place], [place] [address]   Troponin I (High Sensitivity)     Status: None   Collection Time: 04/21/22 11:53 PM  Result Value Ref Range   Troponin I (High Sensitivity) <2 <18 ng/L    Comment: (NOTE) Elevated high sensitivity troponin I (hsTnI) values and significant  changes across serial measurements may suggest ACS but many other  chronic and acute conditions are known to elevate hsTnI results.  Refer to the "Links" section for chest pain algorithms and additional  guidance. Performed at [facility], [place] [address] [place], [place] [address]   CK     Status: Abnormal   Collection Time: 04/21/22 11:53 PM  Result Value Ref Range   Total CK 23 (L) 38 - 234 U/L    Comment: Performed at [facility], [place] [address], [place], [place] [address]  Magnesium     Status: Abnormal   Collection Time: 04/21/22 11:53 PM  Result Value Ref Range   Magnesium 1.6 (L) 1.7 - 2.4 mg/dL    Comment: Performed at [facility], [place] [address] [place], [place] [address]  Phosphorus     Status: None   Collection Time: 04/21/22 11:53 PM  Result Value Ref Range   Phosphorus 3.3 2.5 - 4.6 mg/dL    Comment: Performed at [facility], [place] [address] [place], [place] [address]  Lactic acid, plasma     Status: Abnormal   Collection Time: 04/21/22 11:53 PM  Result Value Ref Range   Lactic Acid, Venous 2.2 (HH) 0.5 - 1.9 mmol/L    Comment: CRITICAL RESULT CALLED TO, READ BACK BY AND VERIFIED WITH FOUST,M RN AT 0020 ON 04/22/22 BY  [email or web address] Performed at [facility], [place] [address], [place], [place] [address]   TSH     Status: None   Collection Time: 04/21/22 11:53 PM  Result Value Ref Range   TSH 1.318 0.350 - 4.500 uIU/mL    Comment: Performed by a 3rd Generation assay with a functional sensitivity of <=0.01 uIU/mL. Performed at [facility], [place] [address] [place], [place] [address]   Lactic acid, plasma     Status: None   Collection Time: 04/22/22  1:52 AM  Result Value Ref Range   Lactic Acid, Venous 0.9 0.5 - 1.9 mmol/L    Comment: Performed at [facility], [place] [address], [place], [place] [address]  Prealbumin     Status: Abnormal   Collection Time: 04/22/22  1:52 AM  Result Value Ref Range   Prealbumin 14 (L) 18 - 38 mg/dL    Comment: Performed at [place] [address] [place], [place] [address]  Comprehensive metabolic panel     Status: Abnormal   Collection Time: 04/22/22  1:52 AM  Result Value Ref Range   Sodium 137 135 - 145 mmol/L   Potassium 3.7 3.5 - 5.1 mmol/L   Chloride 111 98 - 111 mmol/L   CO2 20 (L) 22 - 32 mmol/L   Glucose, Bld 116 (H) 70 - 99 mg/dL    Comment: Glucose reference range applies only to samples taken after fasting for at least 8 hours.   BUN 19 8 - 23 mg/dL   Creatinine, Ser 0.68 0.44 - 1.00 mg/dL   Calcium 8.1 (L) 8.9 - 10.3 mg/dL   Total Protein 5.5 (L) 6.5 - 8.1 g/dL   Albumin 2.9 (L) 3.5 - 5.0 g/dL   AST 84 (H) 15 - 41 U/L   ALT 50 (H) 0 - 44 U/L   Alkaline Phosphatase 95 38 - 126 U/L   Total Bilirubin 0.8 0.3 - 1.2 mg/dL   GFR, Estimated >60 >60 mL/min    Comment: (NOTE) Calculated using the CKD-EPI Creatinine Equation (2021)    Anion gap 6 5 - 15    Comment: Performed at [facility], [place] [address], [place], [place] [address]  CBC     Status: Abnormal   Collection Time: 04/22/22  1:52 AM  Result Value Ref Range   WBC 7.1 4.0 - 10.5 K/uL   RBC 3.49 (L) 3.87 - 5.11  MIL/uL   Hemoglobin 10.7 (L) 12.0 - 15.0 g/dL   HCT 31.9 (L) 36.0 - 46.0 %   MCV 91.4 80.0 - 100.0 fL   MCH 30.7 26.0 - 34.0 pg   MCHC 33.5 30.0 - 36.0 g/dL   RDW 13.5 11.5 - 15.5 %   Platelets 167 150 - 400 K/uL   nRBC 0.0 0.0 - 0.2 %    Comment: Performed at [facility], [place] [address] [place],  [address]  Magnesium     Status: Abnormal   Collection Time: 04/22/22  1:52 AM  Result Value Ref Range   Magnesium 2.7 (H) 1.7 - 2.4 mg/dL  Comment: Performed at [facility], [place] [address], [address] [place], [place] [address]  Phosphorus     Status: None   Collection Time: 04/22/22  1:52 AM  Result Value Ref Range   Phosphorus 3.1 2.5 - 4.6 mg/dL    Comment: Performed at [facility], [place] [address] [place],  [address]   CT ABDOMEN PELVIS W CONTRAST  Result Date: 04/21/2022 CLINICAL DATA:  Generalized abdominal pain left greater than right EXAM: CT ABDOMEN AND PELVIS WITH CONTRAST TECHNIQUE: Multidetector CT imaging of the abdomen and pelvis was performed using the standard protocol following bolus administration of intravenous contrast. RADIATION DOSE REDUCTION: This exam was performed according to the departmental dose-optimization program which includes automated exposure control, adjustment of the mA and/or kV according to patient size and/or use of iterative reconstruction technique. CONTRAST:  14m OMNIPAQUE IOHEXOL 300 MG/ML  SOLN COMPARISON:  None Available. FINDINGS: Lower chest: No acute pleural or parenchymal lung disease. Hepatobiliary: No focal liver abnormality is seen. Status post cholecystectomy. No biliary dilatation. Pancreas: Unremarkable. No pancreatic ductal dilatation or surrounding inflammatory changes. Spleen: Normal in size without focal abnormality. Adrenals/Urinary Tract: Multiple nonobstructing left renal calculi, largest measuring 6 mm. Mild cortical scarring upper pole right kidney. Scattered  subcentimeter hypodensities throughout the kidneys are too small to characterize but likely reflect small cysts. No specific imaging follow-up is required. The adrenals and bladder are unremarkable. Stomach/Bowel: Postsurgical changes are seen from previous gastric bariatric surgery as well as multiple small bowel resections and reanastomosis. Multiple dilated loops of mid jejunum are identified, measuring up to 3.2 cm in diameter with scattered gas fluid levels. The distal jejunum and ileum are decompressed, with transition point in the right lower quadrant reference image 51/2. There is moderate distention of the colon with diffuse gas fluid levels. This could reflect diarrhea or recent laxative/enema use. No bowel wall thickening or inflammatory change. Vascular/Lymphatic: Aortic atherosclerosis. Subcentimeter lymph nodes within the central mesentery are likely reactive. No pathologic adenopathy. Reproductive: Status post hysterectomy. No adnexal masses. Other: No free fluid or free intraperitoneal gas. No abdominal wall hernia. Musculoskeletal: No acute or destructive bony lesions. Reconstructed images demonstrate no additional findings. IMPRESSION: 1. Mild proximal jejunal distension and scattered gas fluid levels, consistent with early or intermittent small bowel obstruction. 2. Gas fluid levels throughout the colon, which may reflect diarrhea or recent laxative/enema use. 3. Reactive subcentimeter central mesenteric lymph nodes. No pathologic adenopathy. 4. Nonobstructing left renal calculi, largest measuring 6 mm. 5.  Aortic Atherosclerosis (ICD10-I70.0). Electronically Signed   By: MRanda NgoM.D.   On: 04/21/2022 21:09    Anti-infectives (From admission, onward)    None       Assessment/Plan SBO - CT w/ mild proximal jejunal distension and scattered gas fluid levels with transition in RLQ consistent with early or intermittent small bowel obstruction. Patient with multiple prior abdominal  operations including TAH, multiple prior sbo's requiring Exlap w/ at least one SBR, Lap Chole, Gastric Sleeve and Linx procedure. Per notes, she has extensive intra-abdominal adhesions that prevented her bariatric surgeon in [place] from being able to perform bypass and opting for gastric sleeve during the time of surgery.  - Afebrile. Tachycardia resolved. No systolic hypotension. No peritonitis on exam. WBC 11.8 > 7.1. Lactic 2.2 > 0.9. No current indication for emergency surgery - Patient agreeable to NGT. Reports with hx of Linx procedure this always has to be placed fluoroscopically. Will reach out to radiology to see if they can  place this. Keep NPO. - Once NGT is placed, start SBO protocol - Keep K > 4 and Mg > 2 for bowel function - Mobilize for bowel function - Hopefully patient will improve with conservative management. If patient fails to improve with conservative management, they may require exploratory surgery during admission - Agree with medical admission. We will follow with you.  FEN - NPO, place NGT, IVF per TRH VTE - SCDs, okay for chem ppx from our standpoint ID - None Dispo - Admit to [facility].   I reviewed nursing notes, ED provider notes, hospitalist notes, last 24 h vitals and pain scores, last 48 h intake and output, last 24 h labs and trends, and last 24 h imaging results.  Jillyn Ledger, [facility] 04/22/2022, 7:17 AM Please see Amion for pager number during day hours 7:00am-4:30pm

## 2022-04-22 NOTE — TOC Initial Note (Addendum)
Transition of Care [facility]) - Initial/Assessment Note    Patient Details  Name: Valerie [name] MRN: [id number] Date of Birth: [birth year]  Transition of Care [facility]) CM/SW Contact:    Ninfa Meeker, RN Phone Number: 04/22/2022, 12:37 PM  Clinical Narrative:                 Transition of Care Screening Note:  Transition of Care [facility]) Department has reviewed patient and no TOC needs have been identified at this time. We will continue to monitor patient advancement through Interdisciplinary progressions and if new patient needs arise, please place a consult.    Patient Goals and CMS Choice            Expected Discharge Plan and Services                                              Prior Living Arrangements/Services                       Activities of Daily Living      Permission Sought/Granted                  Emotional Assessment              Admission diagnosis:  SBO (small bowel obstruction) ([place]) [K56.609] Generalized abdominal pain [R10.84] Patient Active Problem List   Diagnosis Date Noted   SBO (small bowel obstruction) ([place]) 04/21/2022   Hypothyroidism 04/21/2022   QT prolongation 04/21/2022   PCP:  Caren Macadam, MD Pharmacy:   [facility], [address] [place] [address] Phone: [phone] Fax: [phone]     Social Determinants of Health (SDOH) Social History: SDOH Screenings   Depression (PHQ2-9): Low Risk  (10/13/2021)  Tobacco Use: Low Risk  (04/21/2022)   SDOH Interventions:     Readmission Risk Interventions     No data to display

## 2022-04-23 ENCOUNTER — Inpatient Hospital Stay (HOSPITAL_COMMUNITY): Payer: Medicare Other

## 2022-04-23 LAB — COMPREHENSIVE METABOLIC PANEL
ALT: 39 U/L (ref 0–44)
AST: 36 U/L (ref 15–41)
Albumin: 2.9 g/dL — ABNORMAL LOW (ref 3.5–5.0)
Alkaline Phosphatase: 92 U/L (ref 38–126)
Anion gap: 7 (ref 5–15)
BUN: 17 mg/dL (ref 8–23)
CO2: 24 mmol/L (ref 22–32)
Calcium: 8.2 mg/dL — ABNORMAL LOW (ref 8.9–10.3)
Chloride: 108 mmol/L (ref 98–111)
Creatinine, Ser: 0.7 mg/dL (ref 0.44–1.00)
GFR, Estimated: >60 mL/min (ref >60)
Glucose, Bld: 119 mg/dL — ABNORMAL HIGH (ref 70–99)
Potassium: 3.7 mmol/L (ref 3.5–5.1)
Sodium: 139 mmol/L (ref 135–145)
Total Bilirubin: 0.4 mg/dL (ref 0.3–1.2)
Total Protein: 5.5 g/dL — ABNORMAL LOW (ref 6.5–8.1)

## 2022-04-23 MED ORDER — LEVOTHYROXINE SODIUM 100 MCG/5ML IV SOLN: 37.5000 ug | Freq: Every day | INTRAVENOUS | Status: DC

## 2022-04-23 MED ORDER — HYDROMORPHONE HCL 1 MG/ML IJ SOLN
0.5000 mg | INTRAMUSCULAR | Status: DC | PRN
  Administered 2022-04-23: 0.5 mg via INTRAVENOUS
  Filled 2022-04-23: qty 0.5

## 2022-04-23 MED ORDER — DIPHENHYDRAMINE HCL 50 MG/ML IJ SOLN: 25.0000 mg | Freq: Three times a day (TID) | INTRAMUSCULAR | Status: DC | PRN

## 2022-04-23 NOTE — Progress Notes (Signed)
Subjective: CC: NGT placed in radiology yesterday. Patient reports it was not hooked to LIWS overnight/this am. Asked RN to get attachment so NGT can be placed to LIWS. Patient reports she is still with some nausea. Central abdominal pain still present but improved. No flatus or bm. Contrast in colon on xray.   Objective: Vital signs in last 24 hours: Temp:  [98.5 F (36.9 C)-98.8 F (37.1 C)] 98.6 F (37 C) (02/22 0518) Pulse Rate:  [78-84] 78 (02/22 0518) Resp:  [15-16] 15 (02/22 0518) BP: (111-124)/(65-71) 111/65 (02/22 0518) SpO2:  [93 %-94 %] 94 % (02/22 0518) Weight:  [50.8 kg] 50.8 kg (02/21 2100) Last BM Date : 04/21/22  Intake/Output from previous day: 02/21 0701 - 02/22 0700 In: 143.3 [I.V.:143.3] Out: 300 [Urine:300] Intake/Output this shift: No intake/output data recorded.  PE: Gen:  Alert, NAD, pleasant Abd: Soft, mild distension, central abdominal ttp without rigidity or guarding, +BS. NGT in place, clamped.   Lab Results:  Recent Labs    04/21/22 1940 04/22/22 0152  WBC 11.8* 7.1  HGB 14.2 10.7*  HCT 43.9 31.9*  PLT 231 167   BMET Recent Labs    04/22/22 0152 04/23/22 0621  NA 137 139  K 3.7 3.7  CL 111 108  CO2 20* 24  GLUCOSE 116* 119*  BUN 19 17  CREATININE 0.68 0.70  CALCIUM 8.1* 8.2*   PT/INR No results for input(s): "LABPROT", "INR" in the last 72 hours. CMP     Component Value Date/Time   NA 139 04/23/2022 0621   K 3.7 04/23/2022 0621   CL 108 04/23/2022 0621   CO2 24 04/23/2022 0621   GLUCOSE 119 (H) 04/23/2022 0621   BUN 17 04/23/2022 0621   CREATININE 0.70 04/23/2022 0621   CALCIUM 8.2 (L) 04/23/2022 0621   PROT 5.5 (L) 04/23/2022 0621   ALBUMIN 2.9 (L) 04/23/2022 0621   AST 36 04/23/2022 0621   ALT 39 04/23/2022 0621   ALKPHOS 92 04/23/2022 0621   BILITOT 0.4 04/23/2022 0621   GFRNONAA >60 04/23/2022 0621   Lipase     Component Value Date/Time   LIPASE 37 04/21/2022 1940    Studies/Results: DG Abd  Portable 1V-Small Bowel Obstruction Protocol-initial, 8 hr delay  Result Date: 04/23/2022 CLINICAL DATA:  Follow up small bowel obstruction EXAM: PORTABLE ABDOMEN - 1 VIEW COMPARISON:  CT from 04/21/2022, plain film from 04/22/2022 FINDINGS: Previously administered contrast now lies entirely within the colon consistent with partial small bowel obstruction when compared with the prior CT examination. The previously seen dilated loops of small bowel are predominately decompressed at this time. Feeding catheter is noted in the distal stomach. Changes of prior LINX procedure are noted. IMPRESSION: Contrast now lies entirely within the colon. No significant small bowel dilatation is seen. Electronically Signed   By: Inez Catalina M.D.   On: 04/23/2022 01:32   DG Loyce Dys Tube Plc W/Fl W/Rad  Result Date: 04/22/2022 CLINICAL DATA:  Patient with history of gastric sleeve, hiatal hernia repair and LINX procedure admitted for SBO. Patient received for fluoroscopic nasogastric tube placement. EXAM: NASO G TUBE PLACEMENT WITH FL AND WITH RAD CONTRAST:  None FLUOROSCOPY: Radiation Exposure Index (if provided by the fluoroscopic device): 5.20 mGy Number of Acquired Spot Images: 0 Feeding tube was advanced the nasogastric approach in the proximal stomach. Feeding tube pass through ring at the GE junction. Feeding tube could not be advanced beyond the gastric body. Evidence of gastric sleeve  anatomy. Stylet removed. COMPARISON:  CT 04/21/2022 FINDINGS: Feeding tube is extended into the proximal stomach. Feeding tube could not be advanced beyond the proximal stomach. Feeding tube passes through a ring at the GE junction. Solid removed. IMPRESSION: Feeding tube in stomach.  Stylet removed. Electronically Signed   By: Suzy Bouchard M.D.   On: 04/22/2022 17:07   DG Abd 1 View  Result Date: 04/22/2022 CLINICAL DATA:  [id number] with small-bowel obstruction. EXAM: ABDOMEN - 1 VIEW COMPARISON:  CT with contrast yesterday. FINDINGS:  Small bowel dilatation continues to be seen in the mid to lower abdomen up to 4.4 cm. Postsurgical changes are again noted of the stomach, and cholecystectomy. Multiple nonobstructive stones in left kidney were better demonstrated on CT as well as punctate stones on the right. There is contrast in the bladder.  No new abnormality. IMPRESSION: 1. Persistent small-bowel obstruction. 2. Bilateral renal calyceal stones, better demonstrated on CT. 3. Postsurgical changes of the stomach and cholecystectomy. Electronically Signed   By: Telford Nab M.D.   On: 04/22/2022 07:43   CT ABDOMEN PELVIS W CONTRAST  Result Date: 04/21/2022 CLINICAL DATA:  Generalized abdominal pain left greater than right EXAM: CT ABDOMEN AND PELVIS WITH CONTRAST TECHNIQUE: Multidetector CT imaging of the abdomen and pelvis was performed using the standard protocol following bolus administration of intravenous contrast. RADIATION DOSE REDUCTION: This exam was performed according to the departmental dose-optimization program which includes automated exposure control, adjustment of the mA and/or kV according to patient size and/or use of iterative reconstruction technique. CONTRAST:  55m OMNIPAQUE IOHEXOL 300 MG/ML  SOLN COMPARISON:  None Available. FINDINGS: Lower chest: No acute pleural or parenchymal lung disease. Hepatobiliary: No focal liver abnormality is seen. Status post cholecystectomy. No biliary dilatation. Pancreas: Unremarkable. No pancreatic ductal dilatation or surrounding inflammatory changes. Spleen: Normal in size without focal abnormality. Adrenals/Urinary Tract: Multiple nonobstructing left renal calculi, largest measuring 6 mm. Mild cortical scarring upper pole right kidney. Scattered subcentimeter hypodensities throughout the kidneys are too small to characterize but likely reflect small cysts. No specific imaging follow-up is required. The adrenals and bladder are unremarkable. Stomach/Bowel: Postsurgical changes are  seen from previous gastric bariatric surgery as well as multiple small bowel resections and reanastomosis. Multiple dilated loops of mid jejunum are identified, measuring up to 3.2 cm in diameter with scattered gas fluid levels. The distal jejunum and ileum are decompressed, with transition point in the right lower quadrant reference image 51/2. There is moderate distention of the colon with diffuse gas fluid levels. This could reflect diarrhea or recent laxative/enema use. No bowel wall thickening or inflammatory change. Vascular/Lymphatic: Aortic atherosclerosis. Subcentimeter lymph nodes within the central mesentery are likely reactive. No pathologic adenopathy. Reproductive: Status post hysterectomy. No adnexal masses. Other: No free fluid or free intraperitoneal gas. No abdominal wall hernia. Musculoskeletal: No acute or destructive bony lesions. Reconstructed images demonstrate no additional findings. IMPRESSION: 1. Mild proximal jejunal distension and scattered gas fluid levels, consistent with early or intermittent small bowel obstruction. 2. Gas fluid levels throughout the colon, which may reflect diarrhea or recent laxative/enema use. 3. Reactive subcentimeter central mesenteric lymph nodes. No pathologic adenopathy. 4. Nonobstructing left renal calculi, largest measuring 6 mm. 5.  Aortic Atherosclerosis (ICD10-I70.0). Electronically Signed   By: MRanda NgoM.D.   On: 04/21/2022 21:09    Anti-infectives: Anti-infectives (From admission, onward)    None        Assessment/Plan SBO - CT w/ mild proximal jejunal distension and scattered gas  fluid levels with transition in RLQ consistent with early or intermittent small bowel obstruction. Patient with multiple prior abdominal operations including TAH, multiple prior sbo's requiring Exlap w/ at least one SBR, Lap Chole, Gastric Sleeve and Linx procedure. Per notes, she has extensive intra-abdominal adhesions that prevented her bariatric surgeon  in [place] from being able to perform bypass and opting for gastric sleeve during the time of surgery.  - Afebrile. No tachycardia or systolic hypotension. No peritonitis on exam. WBC wnl yesterday. No current indication for emergency surgery - NGT placed by radiology given patients hx of Linx procedure. Although contrast in colon this am, she is still with symptoms and no return of bowel function. Will leave NGT on LIWS today.  - Keep K > 4 and Mg > 2 for bowel function - Mobilize for bowel function - Hopefully patient will improve with conservative management. If patient fails to improve with conservative management, they may require exploratory surgery during admission - Agree with medical admission. We will follow with you.   FEN - NPO, NGT on LIWS, IVF per TRH VTE - SCDs, okay for chem ppx from our standpoint ID - None  I reviewed nursing notes, hospitalist notes, last 24 h vitals and pain scores, last 48 h intake and output, last 24 h labs and trends, and last 24 h imaging results.   LOS: 2 days    Jillyn Ledger , [facility] 04/23/2022, 8:48 AM Please see Amion for pager number during day hours 7:00am-4:30pm

## 2022-04-23 NOTE — Plan of Care (Signed)

## 2022-04-23 NOTE — Progress Notes (Signed)
PROGRESS NOTE  Valerie [name]  [id number] DOB: December 02, 1960 DOA: 04/21/2022 PCP: Caren Macadam, MD   Brief Narrative: Patient is a 62 year old female with history of gastric sleeve procedure, SBO, anxiety who presented here with complaint of nausea, vomiting, abdominal pain for 3 days.  CT abdomen/pelvis on admission showed mild proximal abdominal distention, scattered gas/fluid level consistent with early or intermittent small bowel obstruction.  General surgery following.  Currently on conservative management  Assessment & Plan:  Principal Problem:   SBO (small bowel obstruction) ([facility]) Active Problems:   Hypothyroidism   QT prolongation  SBO: Presented with nausea, vomiting, abdominal pain.  CT imaging as above.  Started on conservative management :with IV fluid, n.p.o. status, NG tube.  General surgery closely following.Radiology put  the NG tube fluoroscopically because of history of Linx procedure History of SBO in the past.  Multiple prior abdominal surgeries including TAH, gastric sleeve.  Most likely this is associated with adhesions. Continues to complain of severe abdominal pain today.  Less nausea, no bowel sounds  Hypothyroidism: On iv levothyroxine  Elevated liver enzymes: Mild, unclear etiology. resolved  Elevated lactic acid level/leukocytosis: Resolved         DVT prophylaxis:SCDs Start: 04/21/22 2306     Code Status: Full Code  Family Communication: Husband at bedside  Patient status:Inpatient  Patient is from :Home  Anticipated discharge [id number]  Estimated DC date:1-2 days   Consultants: General surgery  Procedures:None  Antimicrobials:  Anti-infectives (From admission, onward)    None       Subjective: Patient seen and examined at bedside today.  Hemodynamically stable.  Complains of severe abdominal pain.  Not passing gas.  Nausea better today  Objective: Vitals:   04/22/22 1257 04/22/22 1933 04/22/22 2100 04/23/22 0518  BP:  111/65 124/71  111/65  Pulse: 80 84  78  Resp: 16 15  15  $ Temp: 98.8 F (37.1 C) 98.5 F (36.9 C)  98.6 F (37 C)  TempSrc: Oral Oral  Oral  SpO2: 94% 93%  94%  Weight:   50.8 kg   Height:   4' 11"$  (1.499 m)     Intake/Output Summary (Last 24 hours) at 04/23/2022 1134 Last data filed at 04/23/2022 0000 Gross per 24 hour  Intake 143.26 ml  Output --  Net 143.26 ml   Filed Weights   04/21/22 1921 04/22/22 2100  Weight: 50.8 kg 50.8 kg    Examination:  General exam: Overall comfortable, not in distress HEENT: PERRL,feeding tube Respiratory system:  no wheezes or crackles  Cardiovascular system: S1 & S2 heard, RRR.  Gastrointestinal system: Abdomen is nondistended, soft , has generalized tenderness, no bowel sounds heard Central nervous system: Alert and oriented Extremities: No edema, no clubbing ,no cyanosis Skin: No rashes, no ulcers,no icterus      Data Reviewed: I have personally reviewed following labs and imaging studies  CBC: Recent Labs  Lab 04/21/22 1940 04/22/22 0152  WBC 11.8* 7.1  NEUTROABS 9.6*  --   HGB 14.2 10.7*  HCT 43.9 31.9*  MCV 92.4 91.4  PLT 231 [id number]   Basic Metabolic Panel: Recent Labs  Lab 04/21/22 1940 04/21/22 2353 04/22/22 0152 04/23/22 0621  NA 138  --  137 139  K 3.9  --  3.7 3.7  CL 109  --  111 108  CO2 18*  --  20* 24  GLUCOSE 151*  --  116* 119*  BUN 23  --  19 17  CREATININE 0.98  --  0.68  0.70  CALCIUM 9.3  --  8.1* 8.2*  MG  --  1.6* 2.7*  --   PHOS  --  3.3 3.1  --      No results found for this or any previous visit (from the past 240 hour(s)).   Radiology Studies: DG Abd Portable 1V-Small Bowel Obstruction Protocol-initial, 8 hr delay  Result Date: 04/23/2022 CLINICAL DATA:  Follow up small bowel obstruction EXAM: PORTABLE ABDOMEN - 1 VIEW COMPARISON:  CT from 04/21/2022, plain film from 04/22/2022 FINDINGS: Previously administered contrast now lies entirely within the colon consistent with partial small  bowel obstruction when compared with the prior CT examination. The previously seen dilated loops of small bowel are predominately decompressed at this time. Feeding catheter is noted in the distal stomach. Changes of prior LINX procedure are noted. IMPRESSION: Contrast now lies entirely within the colon. No significant small bowel dilatation is seen. Electronically Signed   By: Inez Catalina M.D.   On: 04/23/2022 01:32   DG Loyce Dys Tube Plc W/Fl W/Rad  Result Date: 04/22/2022 CLINICAL DATA:  Patient with history of gastric sleeve, hiatal hernia repair and LINX procedure admitted for SBO. Patient received for fluoroscopic nasogastric tube placement. EXAM: NASO G TUBE PLACEMENT WITH FL AND WITH RAD CONTRAST:  None FLUOROSCOPY: Radiation Exposure Index (if provided by the fluoroscopic device): 5.20 mGy Number of Acquired Spot Images: 0 Feeding tube was advanced the nasogastric approach in the proximal stomach. Feeding tube pass through ring at the GE junction. Feeding tube could not be advanced beyond the gastric body. Evidence of gastric sleeve anatomy. Stylet removed. COMPARISON:  CT 04/21/2022 FINDINGS: Feeding tube is extended into the proximal stomach. Feeding tube could not be advanced beyond the proximal stomach. Feeding tube passes through a ring at the GE junction. Solid removed. IMPRESSION: Feeding tube in stomach.  Stylet removed. Electronically Signed   By: Suzy Bouchard M.D.   On: 04/22/2022 17:07   DG Abd 1 View  Result Date: 04/22/2022 CLINICAL DATA:  [id number] with small-bowel obstruction. EXAM: ABDOMEN - 1 VIEW COMPARISON:  CT with contrast yesterday. FINDINGS: Small bowel dilatation continues to be seen in the mid to lower abdomen up to 4.4 cm. Postsurgical changes are again noted of the stomach, and cholecystectomy. Multiple nonobstructive stones in left kidney were better demonstrated on CT as well as punctate stones on the right. There is contrast in the bladder.  No new abnormality.  IMPRESSION: 1. Persistent small-bowel obstruction. 2. Bilateral renal calyceal stones, better demonstrated on CT. 3. Postsurgical changes of the stomach and cholecystectomy. Electronically Signed   By: Telford Nab M.D.   On: 04/22/2022 07:43   CT ABDOMEN PELVIS W CONTRAST  Result Date: 04/21/2022 CLINICAL DATA:  Generalized abdominal pain left greater than right EXAM: CT ABDOMEN AND PELVIS WITH CONTRAST TECHNIQUE: Multidetector CT imaging of the abdomen and pelvis was performed using the standard protocol following bolus administration of intravenous contrast. RADIATION DOSE REDUCTION: This exam was performed according to the departmental dose-optimization program which includes automated exposure control, adjustment of the mA and/or kV according to patient size and/or use of iterative reconstruction technique. CONTRAST:  14m OMNIPAQUE IOHEXOL 300 MG/ML  SOLN COMPARISON:  None Available. FINDINGS: Lower chest: No acute pleural or parenchymal lung disease. Hepatobiliary: No focal liver abnormality is seen. Status post cholecystectomy. No biliary dilatation. Pancreas: Unremarkable. No pancreatic ductal dilatation or surrounding inflammatory changes. Spleen: Normal in size without focal abnormality. Adrenals/Urinary Tract: Multiple nonobstructing left renal calculi, largest measuring  6 mm. Mild cortical scarring upper pole right kidney. Scattered subcentimeter hypodensities throughout the kidneys are too small to characterize but likely reflect small cysts. No specific imaging follow-up is required. The adrenals and bladder are unremarkable. Stomach/Bowel: Postsurgical changes are seen from previous gastric bariatric surgery as well as multiple small bowel resections and reanastomosis. Multiple dilated loops of mid jejunum are identified, measuring up to 3.2 cm in diameter with scattered gas fluid levels. The distal jejunum and ileum are decompressed, with transition point in the right lower quadrant reference  image 51/2. There is moderate distention of the colon with diffuse gas fluid levels. This could reflect diarrhea or recent laxative/enema use. No bowel wall thickening or inflammatory change. Vascular/Lymphatic: Aortic atherosclerosis. Subcentimeter lymph nodes within the central mesentery are likely reactive. No pathologic adenopathy. Reproductive: Status post hysterectomy. No adnexal masses. Other: No free fluid or free intraperitoneal gas. No abdominal wall hernia. Musculoskeletal: No acute or destructive bony lesions. Reconstructed images demonstrate no additional findings. IMPRESSION: 1. Mild proximal jejunal distension and scattered gas fluid levels, consistent with early or intermittent small bowel obstruction. 2. Gas fluid levels throughout the colon, which may reflect diarrhea or recent laxative/enema use. 3. Reactive subcentimeter central mesenteric lymph nodes. No pathologic adenopathy. 4. Nonobstructing left renal calculi, largest measuring 6 mm. 5.  Aortic Atherosclerosis (ICD10-I70.0). Electronically Signed   By: Randa Ngo M.D.   On: 04/21/2022 21:09    Scheduled Meds: Continuous Infusions:  sodium chloride Stopped (04/22/22 1514)     LOS: 2 days   Shelly Coss, MD [facility] P2/22/2024, 11:34 AM

## 2022-04-24 ENCOUNTER — Inpatient Hospital Stay (HOSPITAL_COMMUNITY): Payer: Medicare Other

## 2022-04-24 LAB — BASIC METABOLIC PANEL
Anion gap: 6 (ref 5–15)
BUN: 13 mg/dL (ref 8–23)
CO2: 22 mmol/L (ref 22–32)
Calcium: 8.1 mg/dL — ABNORMAL LOW (ref 8.9–10.3)
Chloride: 110 mmol/L (ref 98–111)
Creatinine, Ser: 0.55 mg/dL (ref 0.44–1.00)
GFR, Estimated: >60 mL/min (ref >60)
Glucose, Bld: 90 mg/dL (ref 70–99)
Potassium: 3.2 mmol/L — ABNORMAL LOW (ref 3.5–5.1)
Sodium: 138 mmol/L (ref 135–145)

## 2022-04-24 MED ORDER — POTASSIUM CHLORIDE 10 MEQ/100ML IV SOLN
10.0000 meq | INTRAVENOUS | Status: DC
  Administered 2022-04-24: 10 meq via INTRAVENOUS
  Filled 2022-04-24 (×2): qty 100

## 2022-04-24 MED ORDER — POTASSIUM CHLORIDE CRYS ER 20 MEQ PO TBCR
40.0000 meq | EXTENDED_RELEASE_TABLET | Freq: Once | ORAL | Status: AC
  Administered 2022-04-24: 40 meq via ORAL
  Filled 2022-04-24: qty 2

## 2022-04-24 MED ORDER — POTASSIUM CHLORIDE CRYS ER 20 MEQ PO TBCR: 40.0000 meq | EXTENDED_RELEASE_TABLET | Freq: Every day | ORAL | 0 refills | Status: DC

## 2022-04-24 NOTE — Discharge Summary (Signed)
Physician Discharge Summary  Valerie [name] [id number] DOB: March 04, 1960 DOA: 04/21/2022  PCP: Caren Macadam, MD  Admit date: 04/21/2022 Discharge date: 04/24/2022  Admitted From: Home Disposition:  Home  Discharge Condition:Stable CODE STATUS:FULL Diet recommendation:  Regular   Brief/Interim Summary: Patient is a 62 year old female with history of gastric sleeve procedure, SBO, anxiety who presented here with complaint of nausea, vomiting, abdominal pain for 3 days. CT abdomen/pelvis on admission showed mild proximal abdominal distention, scattered gas/fluid level consistent with early or intermittent small bowel obstruction.  Started on conservative management with NG tube placement.  This morning she had a bowel movement.  Abdominal pain resolved, no nausea or vomiting.  She tolerated soft diet.  Desperately wants to go home today.  Cleared for discharge from general surgery  Following problems were addressed during the hospitalization:  SBO: Presented with nausea, vomiting, abdominal pain.  CT imaging as above.  Started on conservative management :with IV fluid, n.p.o. status, NG tube.  General surgery was closely following.Radiology put  the NG tube fluoroscopically because of history of Linx procedure History of SBO in the past.  Multiple prior abdominal surgeries including TAH, gastric sleeve.  Most likely this is associated with adhesions. This morning she had a bowel movement.  Abdominal pain resolved, no nausea or vomiting.  She tolerated soft diet.     Hypothyroidism: On  levothyroxine   Elevated liver enzymes: Mild, unclear etiology. resolved   Elevated lactic acid level/leukocytosis: Resolved  Hypokalemia: Supplemented     Discharge Diagnoses:  Principal Problem:   SBO (small bowel obstruction) (HCC) Active Problems:   Hypothyroidism   QT prolongation    Discharge Instructions  Discharge Instructions     Diet - low sodium heart healthy   Complete by: As  directed    Discharge instructions   Complete by: As directed    1)Please take prescribed medications as instructed 2)Follow up with general surgery as an outpatient   Increase activity slowly   Complete by: As directed       Allergies as of 04/24/2022       Reactions   Oxycodone-acetaminophen Itching   Penicillins Hives   Did it involve swelling of the face/tongue/throat, SOB, or low BP? No Did it involve sudden or severe rash/hives, skin peeling, or any reaction on the inside of your mouth or nose? Yes Did you need to seek medical attention at a hospital or doctor's office? No When did it last happen?      Over 57 Years Ago If all above answers are "NO", may proceed with cephalosporin use.        Medication List     TAKE these medications    ALPRAZolam 0.5 MG tablet Commonly known as: XANAX Take 1 tablet (0.5 mg total) by mouth 2 (two) times daily as needed. What changed: reasons to take this   docusate sodium 100 MG capsule Commonly known as: COLACE Take 100 mg by mouth daily.   FLUoxetine 20 MG capsule Commonly known as: PROZAC Take 4 capsules (80 mg total) by mouth daily.   folic acid 1 MG tablet Commonly known as: FOLVITE Take 1 mg by mouth daily.   levothyroxine 75 MCG tablet Commonly known as: SYNTHROID Take 75 mcg by mouth daily before breakfast.   Linzess 290 MCG Caps capsule Generic drug: linaclotide Take 290 mcg by mouth every morning.   omeprazole 40 MG capsule Commonly known as: PRILOSEC Take 40 mg by mouth daily as needed (heartburn).   potassium chloride  SA 20 MEQ tablet Commonly known as: KLOR-CON M Take 2 tablets (40 mEq total) by mouth daily for 3 days. Start taking on: April 25, 2022   QUEtiapine 100 MG tablet Commonly known as: SEROquel Take 1 tablet (100 mg total) by mouth at bedtime. What changed: Another medication with the same name was removed. Continue taking this medication, and follow the directions you see here.    senna 8.6 MG Tabs tablet Commonly known as: SENOKOT Take 4 tablets by mouth in the morning.   temazepam 30 MG capsule Commonly known as: RESTORIL Take 1 capsule (30 mg total) by mouth at bedtime.        Allergies  Allergen Reactions   Oxycodone-Acetaminophen Itching   Penicillins Hives    Did it involve swelling of the face/tongue/throat, SOB, or low BP? No Did it involve sudden or severe rash/hives, skin peeling, or any reaction on the inside of your mouth or nose? Yes Did you need to seek medical attention at a hospital or doctor's office? No When did it last happen?      Over 79 Years Ago If all above answers are "NO", may proceed with cephalosporin use.      Consultations: General surgery   Procedures/Studies: DG Abd 1 View  Result Date: 04/23/2022 CLINICAL DATA:  Nasogastric tube placement EXAM: ABDOMEN - 1 VIEW COMPARISON:  04/23/2022 FINDINGS: The enteric tube has been slightly advanced with weighted tip now projecting over the left upper quadrant consistent with location in the upper stomach. Residual contrast material in the colon without abnormal distention. Metallic ring structure presumably representing postoperative changes at the EG junction. Surgical clips in the right upper quadrant. Atelectasis in the lung bases. IMPRESSION: Feeding tube tip is slightly advanced with weighted tip now projecting over the left upper quadrant consistent with location in the upper stomach. Electronically Signed   By: Lucienne Capers M.D.   On: 04/23/2022 17:59   DG Abd Portable 1V  Result Date: 04/23/2022 CLINICAL DATA:  Feeding tube location EXAM: PORTABLE ABDOMEN - 1 VIEW COMPARISON:  Prior radiographs obtained earlier today FINDINGS: The weighted tip enteric feeding tube has pulled back and is now in the region of the GE junction. Extensive postsurgical changes are present. Stable pattern of contrast material throughout the colon. IMPRESSION: The weighted tip enteric feeding  tube has pulled back. The tip now overlies the GE junction. Electronically Signed   By: Jacqulynn Cadet M.D.   On: 04/23/2022 16:39   DG Abd Portable 1V-Small Bowel Obstruction Protocol-initial, 8 hr delay  Result Date: 04/23/2022 CLINICAL DATA:  Follow up small bowel obstruction EXAM: PORTABLE ABDOMEN - 1 VIEW COMPARISON:  CT from 04/21/2022, plain film from 04/22/2022 FINDINGS: Previously administered contrast now lies entirely within the colon consistent with partial small bowel obstruction when compared with the prior CT examination. The previously seen dilated loops of small bowel are predominately decompressed at this time. Feeding catheter is noted in the distal stomach. Changes of prior LINX procedure are noted. IMPRESSION: Contrast now lies entirely within the colon. No significant small bowel dilatation is seen. Electronically Signed   By: Inez Catalina M.D.   On: 04/23/2022 01:32   DG Loyce Dys Tube Plc W/Fl W/Rad  Result Date: 04/22/2022 CLINICAL DATA:  Patient with history of gastric sleeve, hiatal hernia repair and LINX procedure admitted for SBO. Patient received for fluoroscopic nasogastric tube placement. EXAM: NASO G TUBE PLACEMENT WITH FL AND WITH RAD CONTRAST:  None FLUOROSCOPY: Radiation Exposure Index (if  provided by the fluoroscopic device): 5.20 mGy Number of Acquired Spot Images: 0 Feeding tube was advanced the nasogastric approach in the proximal stomach. Feeding tube pass through ring at the GE junction. Feeding tube could not be advanced beyond the gastric body. Evidence of gastric sleeve anatomy. Stylet removed. COMPARISON:  CT 04/21/2022 FINDINGS: Feeding tube is extended into the proximal stomach. Feeding tube could not be advanced beyond the proximal stomach. Feeding tube passes through a ring at the GE junction. Solid removed. IMPRESSION: Feeding tube in stomach.  Stylet removed. Electronically Signed   By: Suzy Bouchard M.D.   On: 04/22/2022 17:07   DG Abd 1  View  Result Date: 04/22/2022 CLINICAL DATA:  [id number] with small-bowel obstruction. EXAM: ABDOMEN - 1 VIEW COMPARISON:  CT with contrast yesterday. FINDINGS: Small bowel dilatation continues to be seen in the mid to lower abdomen up to 4.4 cm. Postsurgical changes are again noted of the stomach, and cholecystectomy. Multiple nonobstructive stones in left kidney were better demonstrated on CT as well as punctate stones on the right. There is contrast in the bladder.  No new abnormality. IMPRESSION: 1. Persistent small-bowel obstruction. 2. Bilateral renal calyceal stones, better demonstrated on CT. 3. Postsurgical changes of the stomach and cholecystectomy. Electronically Signed   By: Telford Nab M.D.   On: 04/22/2022 07:43   CT ABDOMEN PELVIS W CONTRAST  Result Date: 04/21/2022 CLINICAL DATA:  Generalized abdominal pain left greater than right EXAM: CT ABDOMEN AND PELVIS WITH CONTRAST TECHNIQUE: Multidetector CT imaging of the abdomen and pelvis was performed using the standard protocol following bolus administration of intravenous contrast. RADIATION DOSE REDUCTION: This exam was performed according to the departmental dose-optimization program which includes automated exposure control, adjustment of the mA and/or kV according to patient size and/or use of iterative reconstruction technique. CONTRAST:  97m OMNIPAQUE IOHEXOL 300 MG/ML  SOLN COMPARISON:  None Available. FINDINGS: Lower chest: No acute pleural or parenchymal lung disease. Hepatobiliary: No focal liver abnormality is seen. Status post cholecystectomy. No biliary dilatation. Pancreas: Unremarkable. No pancreatic ductal dilatation or surrounding inflammatory changes. Spleen: Normal in size without focal abnormality. Adrenals/Urinary Tract: Multiple nonobstructing left renal calculi, largest measuring 6 mm. Mild cortical scarring upper pole right kidney. Scattered subcentimeter hypodensities throughout the kidneys are too small to characterize  but likely reflect small cysts. No specific imaging follow-up is required. The adrenals and bladder are unremarkable. Stomach/Bowel: Postsurgical changes are seen from previous gastric bariatric surgery as well as multiple small bowel resections and reanastomosis. Multiple dilated loops of mid jejunum are identified, measuring up to 3.2 cm in diameter with scattered gas fluid levels. The distal jejunum and ileum are decompressed, with transition point in the right lower quadrant reference image 51/2. There is moderate distention of the colon with diffuse gas fluid levels. This could reflect diarrhea or recent laxative/enema use. No bowel wall thickening or inflammatory change. Vascular/Lymphatic: Aortic atherosclerosis. Subcentimeter lymph nodes within the central mesentery are likely reactive. No pathologic adenopathy. Reproductive: Status post hysterectomy. No adnexal masses. Other: No free fluid or free intraperitoneal gas. No abdominal wall hernia. Musculoskeletal: No acute or destructive bony lesions. Reconstructed images demonstrate no additional findings. IMPRESSION: 1. Mild proximal jejunal distension and scattered gas fluid levels, consistent with early or intermittent small bowel obstruction. 2. Gas fluid levels throughout the colon, which may reflect diarrhea or recent laxative/enema use. 3. Reactive subcentimeter central mesenteric lymph nodes. No pathologic adenopathy. 4. Nonobstructing left renal calculi, largest measuring 6 mm. 5.  Aortic  Atherosclerosis (ICD10-I70.0). Electronically Signed   By: Randa Ngo M.D.   On: 04/21/2022 21:09      Subjective: Patient seen and examined at bedside this morning.  Very eager to go home.  Denies abdominal pain, nausea or vomiting  Discharge Exam: Vitals:   04/23/22 1941 04/24/22 0434  BP: 117/69 123/69  Pulse: 73 75  Resp: 16 16  Temp: 99.1 F (37.3 C) 98.1 F (36.7 C)  SpO2: 92% (!) 89%   Vitals:   04/23/22 0518 04/23/22 1205 04/23/22 1941  04/24/22 0434  BP: 111/65 114/68 117/69 123/69  Pulse: 78 84 73 75  Resp: '15 14 16 16  '$ Temp: 98.6 F (37 C) 98.4 F (36.9 C) 99.1 F (37.3 C) 98.1 F (36.7 C)  TempSrc: Oral Axillary Oral Oral  SpO2: 94% 96% 92% (!) 89%  Weight:      Height:        General: Pt is alert, awake, not in acute distress Cardiovascular: RRR, S1/S2 +, no rubs, no gallops Respiratory: CTA bilaterally, no wheezing, no rhonchi Abdominal: Soft, NT, ND, bowel sounds + Extremities: no edema, no cyanosis    The results of significant diagnostics from this hospitalization (including imaging, microbiology, ancillary and laboratory) are listed below for reference.     Microbiology: No results found for this or any previous visit (from the past 240 hour(s)).   Labs: BNP (last 3 results) No results for input(s): "BNP" in the last 8760 hours. Basic Metabolic Panel: Recent Labs  Lab 04/21/22 1940 04/21/22 2353 04/22/22 0152 04/23/22 0621 04/24/22 0503  NA 138  --  137 139 138  K 3.9  --  3.7 3.7 3.2*  CL 109  --  111 108 110  CO2 18*  --  20* 24 22  GLUCOSE 151*  --  116* 119* 90  BUN 23  --  '19 17 13  '$ CREATININE 0.98  --  0.68 0.70 0.55  CALCIUM 9.3  --  8.1* 8.2* 8.1*  MG  --  1.6* 2.7*  --   --   PHOS  --  3.3 3.1  --   --    Liver Function Tests: Recent Labs  Lab 04/21/22 1940 04/22/22 0152 04/23/22 0621  AST 51* 84* 36  ALT 34 50* 39  ALKPHOS 107 95 92  BILITOT 0.9 0.8 0.4  PROT 7.3 5.5* 5.5*  ALBUMIN 4.2 2.9* 2.9*   Recent Labs  Lab 04/21/22 1940  LIPASE 37   No results for input(s): "AMMONIA" in the last 168 hours. CBC: Recent Labs  Lab 04/21/22 1940 04/22/22 0152  WBC 11.8* 7.1  NEUTROABS 9.6*  --   HGB 14.2 10.7*  HCT 43.9 31.9*  MCV 92.4 91.4  PLT 231 167   Cardiac Enzymes: Recent Labs  Lab 04/21/22 2353  CKTOTAL 23*   BNP: Invalid input(s): "POCBNP" CBG: No results for input(s): "GLUCAP" in the last 168 hours. D-Dimer No results for input(s): "DDIMER"  in the last 72 hours. Hgb A1c No results for input(s): "HGBA1C" in the last 72 hours. Lipid Profile No results for input(s): "CHOL", "HDL", "LDLCALC", "TRIG", "CHOLHDL", "LDLDIRECT" in the last 72 hours. Thyroid function studies Recent Labs    04/21/22 2353  TSH 1.318   Anemia work up No results for input(s): "VITAMINB12", "FOLATE", "FERRITIN", "TIBC", "IRON", "RETICCTPCT" in the last 72 hours. Urinalysis    Component Value Date/Time   COLORURINE YELLOW 04/22/2022 [place] 04/22/2022 0931   LABSPEC >1.046 (H) 04/22/2022 [id number]  PHURINE 5.0 04/22/2022 0931   GLUCOSEU NEGATIVE 04/22/2022 0931   HGBUR NEGATIVE 04/22/2022 0931   BILIRUBINUR NEGATIVE 04/22/2022 0931   KETONESUR NEGATIVE 04/22/2022 0931   PROTEINUR NEGATIVE 04/22/2022 0931   NITRITE NEGATIVE 04/22/2022 0931   LEUKOCYTESUR TRACE (A) 04/22/2022 0931   Sepsis Labs Recent Labs  Lab 04/21/22 1940 04/22/22 0152  WBC 11.8* 7.1   Microbiology No results found for this or any previous visit (from the past 240 hour(s)).  Please note: You were cared for by a hospitalist during your hospital stay. Once you are discharged, your primary care physician will handle any further medical issues. Please note that NO REFILLS for any discharge medications will be authorized once you are discharged, as it is imperative that you return to your primary care physician (or establish a relationship with a primary care physician if you do not have one) for your post hospital discharge needs so that they can reassess your need for medications and monitor your lab values.    Time coordinating discharge: 40 minutes  SIGNED:   Shelly Coss, MD  [facility] 04/24/2022, 12:51 PM Pager [phone]  If 7PM-7AM, please contact night-coverage [email or web address]

## 2022-04-24 NOTE — Progress Notes (Addendum)
Patient got up and got out of the bed by herself and upset because the bed alarm was on. She stated that she did not need to call for help whenever she wanted to go to the bathroom. RN educated patient about NG tube that is suctioning and she was hooked with IV and telemetry and it was why her NG tube was out 2 times during the shift. Patient was very rude with RN and had attitude to warned RN that no one could talk to her or order her to do anything. RN apologized for any inconvenience but because of her safety, she need to call for help and prevent to pull NG tube out by getting out of the bed by self and stuck with the tubing around IV pole again.

## 2022-04-24 NOTE — Plan of Care (Signed)

## 2022-04-24 NOTE — Progress Notes (Signed)
Subjective: CC: RN asked to eval early this am as patient was having discomfort from ngt.  Patient reports complete resolution of abdominal pain, distension and nausea this am. No prn pain or anti-nausea medication since last night. Reports she is passing flatus and had a liquid bm.    Objective: Vital signs in last 24 hours: Temp:  [98.1 F (36.7 C)-99.1 F (37.3 C)] 98.1 F (36.7 C) (02/23 0434) Pulse Rate:  [73-84] 75 (02/23 0434) Resp:  [14-16] 16 (02/23 0434) BP: (114-123)/(68-69) 123/69 (02/23 0434) SpO2:  [89 %-96 %] 89 % (02/23 0434) Last BM Date : 04/21/22  Intake/Output from previous day: No intake/output data recorded. Intake/Output this shift: No intake/output data recorded.  PE: Gen:  Alert, NAD, pleasant Abd: Soft, no obvious distension - improved from yesterday. Very minimal ttp in the epigastrium which is improved from yesterday. No rigidity or guarding, +BS. NGT in place, clamped.   Lab Results:  Recent Labs    04/21/22 1940 04/22/22 0152  WBC 11.8* 7.1  HGB 14.2 10.7*  HCT 43.9 31.9*  PLT 231 167    BMET Recent Labs    04/23/22 0621 04/24/22 0503  NA 139 138  K 3.7 3.2*  CL 108 110  CO2 24 22  GLUCOSE 119* 90  BUN 17 13  CREATININE 0.70 0.55  CALCIUM 8.2* 8.1*    PT/INR No results for input(s): "LABPROT", "INR" in the last 72 hours. CMP     Component Value Date/Time   NA 138 04/24/2022 0503   K 3.2 (L) 04/24/2022 0503   CL 110 04/24/2022 0503   CO2 22 04/24/2022 0503   GLUCOSE 90 04/24/2022 0503   BUN 13 04/24/2022 0503   CREATININE 0.55 04/24/2022 0503   CALCIUM 8.1 (L) 04/24/2022 0503   PROT 5.5 (L) 04/23/2022 0621   ALBUMIN 2.9 (L) 04/23/2022 0621   AST 36 04/23/2022 0621   ALT 39 04/23/2022 0621   ALKPHOS 92 04/23/2022 0621   BILITOT 0.4 04/23/2022 0621   GFRNONAA >60 04/24/2022 0503   Lipase     Component Value Date/Time   LIPASE 37 04/21/2022 1940    Studies/Results: DG Abd 1 View  Result Date:  04/23/2022 CLINICAL DATA:  Nasogastric tube placement EXAM: ABDOMEN - 1 VIEW COMPARISON:  04/23/2022 FINDINGS: The enteric tube has been slightly advanced with weighted tip now projecting over the left upper quadrant consistent with location in the upper stomach. Residual contrast material in the colon without abnormal distention. Metallic ring structure presumably representing postoperative changes at the EG junction. Surgical clips in the right upper quadrant. Atelectasis in the lung bases. IMPRESSION: Feeding tube tip is slightly advanced with weighted tip now projecting over the left upper quadrant consistent with location in the upper stomach. Electronically Signed   By: Lucienne Capers M.D.   On: 04/23/2022 17:59   DG Abd Portable 1V  Result Date: 04/23/2022 CLINICAL DATA:  Feeding tube location EXAM: PORTABLE ABDOMEN - 1 VIEW COMPARISON:  Prior radiographs obtained earlier today FINDINGS: The weighted tip enteric feeding tube has pulled back and is now in the region of the GE junction. Extensive postsurgical changes are present. Stable pattern of contrast material throughout the colon. IMPRESSION: The weighted tip enteric feeding tube has pulled back. The tip now overlies the GE junction. Electronically Signed   By: Jacqulynn Cadet M.D.   On: 04/23/2022 16:39   DG Abd Portable 1V-Small Bowel Obstruction Protocol-initial, 8 hr delay  Result Date:  04/23/2022 CLINICAL DATA:  Follow up small bowel obstruction EXAM: PORTABLE ABDOMEN - 1 VIEW COMPARISON:  CT from 04/21/2022, plain film from 04/22/2022 FINDINGS: Previously administered contrast now lies entirely within the colon consistent with partial small bowel obstruction when compared with the prior CT examination. The previously seen dilated loops of small bowel are predominately decompressed at this time. Feeding catheter is noted in the distal stomach. Changes of prior LINX procedure are noted. IMPRESSION: Contrast now lies entirely within the  colon. No significant small bowel dilatation is seen. Electronically Signed   By: Inez Catalina M.D.   On: 04/23/2022 01:32   DG Loyce Dys Tube Plc W/Fl W/Rad  Result Date: 04/22/2022 CLINICAL DATA:  Patient with history of gastric sleeve, hiatal hernia repair and LINX procedure admitted for SBO. Patient received for fluoroscopic nasogastric tube placement. EXAM: NASO G TUBE PLACEMENT WITH FL AND WITH RAD CONTRAST:  None FLUOROSCOPY: Radiation Exposure Index (if provided by the fluoroscopic device): 5.20 mGy Number of Acquired Spot Images: 0 Feeding tube was advanced the nasogastric approach in the proximal stomach. Feeding tube pass through ring at the GE junction. Feeding tube could not be advanced beyond the gastric body. Evidence of gastric sleeve anatomy. Stylet removed. COMPARISON:  CT 04/21/2022 FINDINGS: Feeding tube is extended into the proximal stomach. Feeding tube could not be advanced beyond the proximal stomach. Feeding tube passes through a ring at the GE junction. Solid removed. IMPRESSION: Feeding tube in stomach.  Stylet removed. Electronically Signed   By: Suzy Bouchard M.D.   On: 04/22/2022 17:07    Anti-infectives: Anti-infectives (From admission, onward)    None        Assessment/Plan SBO - CT w/ mild proximal jejunal distension and scattered gas fluid levels with transition in RLQ consistent with early or intermittent small bowel obstruction. Patient with multiple prior abdominal operations including TAH, multiple prior sbo's requiring Exlap w/ at least one SBR, Lap Chole, Gastric Sleeve and Linx procedure. Per notes, she has extensive intra-abdominal adhesions that prevented her bariatric surgeon in [place] from being able to perform bypass and opting for gastric sleeve during the time of surgery.  - Patient clinically and radiographically improving. She had contrast in her colon on 2/22. Now with resolution of symptoms and return of bowel function. D/c NGT and start CLD.   -  Hopefully patient will improve with conservative management. If patient fails to improve with conservative management, they may require exploratory surgery during admission - We will follow with you.   FEN - D/c NGT. Start CLD. IVF per [facility].  Keep K > 4 and Mg > 2 for bowel function VTE - SCDs, okay for chem ppx from our standpoint ID - None  I reviewed nursing notes, hospitalist notes, last 24 h vitals and pain scores, last 48 h intake and output, last 24 h labs and trends, and last 24 h imaging results.   LOS: 3 days    Jillyn Ledger , [facility] Surgery 04/24/2022, 8:31 AM Please see Amion for pager number during day hours 7:00am-4:30pm

## 2022-04-24 NOTE — Care Management Important Message (Signed)
Important Message  Patient Details IM Letter given. Name: Valerie [name] MRN: [id number] Date of Birth: 12/28/1960   Medicare Important Message Given:  Yes     Kerin Salen 04/24/2022, 12:58 PM

## 2022-05-06 ENCOUNTER — Ambulatory Visit (INDEPENDENT_AMBULATORY_CARE_PROVIDER_SITE_OTHER): Payer: Medicare Other | Admitting: Psychiatry

## 2022-05-06 DIAGNOSIS — F411 Generalized anxiety disorder: Secondary | ICD-10-CM | POA: Diagnosis not present

## 2022-05-06 NOTE — Progress Notes (Signed)
[facility] Counselor/Therapist Progress Note  Patient ID: Valerie [name], MRN: [id number],    Date: 05/06/2022  Time Spent: 58 minutes   Treatment Type: Individual Therapy  Reported Symptoms: anxiety, low frustration tolerance level, communication challenges within family  Mental Status Exam:  Appearance:   Casual     Behavior:  Appropriate, Sharing, and Motivated  Motor:  Normal  Speech/Language:   Clear and Coherent  Affect:  anxious  Mood:  anxious  Thought process:  goal directed  Thought content:    Some obsessive thoughts  Sensory/Perceptual disturbances:    WNL  Orientation:  oriented to person, place, time/date, situation, day of week, month of year, year, and stated date of May 06, 2022  Attention:  Good  Concentration:  Good and Fair  Memory:  WNL  Fund of knowledge:   Good  Insight:    Good and Fair  Judgment:   Good and Fair  Impulse Control:  Good and Fair   Risk Assessment: Danger to Self:  No Self-injurious Behavior: No Danger to Others: No Duty to Warn:no Physical Aggression / Violence:No  Access to Firearms a concern: No  Gang Involvement:No   Subjective: Patient in session today reporting anxiety escalated some in home and personal relationships. Acknowledges that stress of other family members impacts patient at times and difficult to have and keep healthier boundaries. Worked today on establishing healthy boundaries and being able to maintain them, and being able to set healthier limits within family. Feels uncomfortable trying to "enforce boundaries she might set" as she interprets that as being "mean towards others" versus "developing good boundaries being a healthy move". Worked with some real-life scenarios in their family that is making their having healthy boundaries and also difficult for patient and husband to negotiate some of their differences rather than only see their viewpoint. Patient worked really well on this and identified strongly  with the need for better boundaries, the difficulty she has always had in setting boundaries that are healthy due to "guilt and fear". Patient able to share and process the guilt and fear she has surrounding the setting/keeping of boundaries and able to see how this guilt/fear has evolved for her over time and is very difficult for patient to even imagine having boundaries that others might respect. Good work in session on this and encouraged her to do some journaling between sessions. Showing increased insight and strength very gradually.   Interventions: Cognitive Behavioral Therapy, Solution-Oriented/Positive Psychology, and Ego-Supportive  Long-term goal: Increase understanding of beliefs and messages that produce the worry and anxiety. Short-term goal: Reduce overall level, frequency, and intensity of the anxiety so that daily functioning is not impaired. Strategies: Patient to learn further about anxiety re: daily skills for managing it, and to use the skills effectively in everyday life.   Diagnosis:   ICD-10-CM   1. Generalized anxiety disorder  F41.1      Plan:  Patient actively participated in session today and showing good motivation as she worked further on her communication, being able to say "no", and boundary issues within family. Hard to make changes but is showing more effort and trying to gain some self-confidence to speak up more within the family in ways that could be healthy for her and a start to having healthier relationships and boundaries within the family. Definitely making progress and needs to continue with goal-directed behaviors in order to maintain her progress thus far and keep moving forward in a positive direction.  Even though the changes are difficult for her, even positive changes, patient does seem to be having more hope for her future in making some changes that if she has felt for a long time she could not make. Encouraged patient and practicing more self  affirming and positive behaviors as noted in session including: Reducing her overthinking and over analyzing, work on her feelings of inadequacy within the family, staying in the present focusing on what she can control or change, healthy nutrition and exercise, refrain from assuming negatives and worst-case scenarios, positive self talk, challenging and counteracting her self-doubt, understand that her anxious thoughts are "just thoughts" and are not necessarily going to play out in reality the way she might anticipate, have more compassion for self and others, not responding so quickly in conversations that are stressful and really try to hear what the other person is saying before she responds, and recognize the strength she shows working with goal-directed behaviors to move in a direction that supports her improved emotional health and overall wellbeing.  Goal review and progress/challenges noted with patient.  Next appointment within 2 to 3 weeks.  This record has been created using [employer].  Chart creation errors have been sought, but may not always have been located and corrected.  Such creation errors do not reflect on the standard of medical care provided.   Shanon Ace, LCSW

## 2022-05-25 ENCOUNTER — Ambulatory Visit (INDEPENDENT_AMBULATORY_CARE_PROVIDER_SITE_OTHER): Payer: Medicare Other | Admitting: Psychiatry

## 2022-05-25 DIAGNOSIS — F411 Generalized anxiety disorder: Secondary | ICD-10-CM

## 2022-05-25 NOTE — Progress Notes (Signed)
[facility] Counselor/Therapist Progress Note  Patient ID: Valerie [name], MRN: [id number],    Date: 05/25/2022  Time Spent: 55 minutes   Treatment Type: Individual Therapy  Reported Symptoms:  anxiety, family issues, anger, sadness, depression  Mental Status Exam:  Appearance:   Casual     Behavior:  Appropriate, Sharing, and Motivated  Motor:  Normal  Speech/Language:   Clear and Coherent  Affect:  anxious  Mood:  anxious  Thought process:  goal directed  Thought content:    Rumination and some obsessive thoughts  Sensory/Perceptual disturbances:    WNL  Orientation:  oriented to person, place, time/date, situation, day of week, month of year, year, and stated date of May 25, 2022  Attention:  Fair  Concentration:  Good and Fair  Memory:  [place] of knowledge:   Good  Insight:    Good and Fair  Judgment:   Good  Impulse Control:  Fair   Risk Assessment: Danger to Self:  No Self-injurious Behavior: No Danger to Others: No Duty to Warn:no Physical Aggression / Violence:No  Access to Firearms a concern: No  Gang Involvement:No   Subjective:  Patient in today reporting depression, anger,  family issues, and  "extreme anxiety and I need to talk about today". "I do feel empowered in managing anxiety using some of the tools we've spoken about in therapy, with some people, but with other types of people, I really struggle."  Very upset re: family situations within son's family and patient's own relationship with husband, and needed session today to share and process the dynamics involved and how all this is impacting patient. (Not all details included in this note due to patient privacy needs.) Multiple wedges in family relationships leading to friction, ill feelings, anger, hurt, and resentment amongst family members. Significant control issues, and patient struggles with "saying no within the family" and feels she "needs to keep the peace even though she is not being  treated well either."  Looking at what she can control or "allow" and what she cannot, and where she can set some healthier boundaries. Insight improving . Better self-care in some ways and still working on this.  Interventions: Cognitive Behavioral Therapy and Ego-Supportive  Long-term goal: Increase understanding of beliefs and messages that produce the worry and anxiety. Short-term goal: Reduce overall level, frequency, and intensity of the anxiety so that daily functioning is not impaired. Strategies: Patient to learn further about anxiety re: daily skills for managing it, and to use the skills effectively in everyday life.   Diagnosis:   ICD-10-CM   1. Generalized anxiety disorder  F41.1      Plan:  Patient today participated well in session and showing good motivation as she worked on her depression, anger, family issues, and "extreme anxiety" related to some complicated issues within the family.  Showing increased insight, some increased self-confidence, and focusing on trying to have healthier boundaries and setting realistic limits for herself with others.  Patient is making progress and needs to continue her work with goal-directed behaviors to retain her progress and also keep moving in a forward direction.  Some of the changes that she has been making seem to be fostering more hope within her as well as helping her develop a stronger sense of self although this is a work in progress.Encouraged patient in her practice of more positive and self affirming behaviors as noted in session including: Reducing her overthinking and over analyzing, decrease her feelings  of inadequacy within the family, staying in the present and focusing on what she can change her control, healthy nutrition and exercise, refrain from assuming negatives and worst-case scenarios, positive self talk, challenge and counteract her self-doubt, understand that her anxious thoughts are "just thoughts" and are not  necessarily going to play out in reality the way she might anticipate, have more compassion for self and others, not responding so quickly in conversations that are stressful and really try to hear what the other person is saying before she responds, and realize the strengths she shows working with goal-directed behaviors to move in a direction that supports her improved emotional health and overall wellbeing.  Goal review and progress/challenges noted with patient.  Next appointment within 2 to 3 weeks.  This record has been created using [employer].  Chart creation errors have been sought, but may not always have been located and corrected.  Such creation errors do not reflect on the standard of medical care provided.   Shanon Ace, LCSW

## 2022-05-28 ENCOUNTER — Other Ambulatory Visit: Payer: Self-pay | Admitting: Behavioral Health

## 2022-05-28 DIAGNOSIS — F411 Generalized anxiety disorder: Secondary | ICD-10-CM

## 2022-05-31 DIAGNOSIS — J069 Acute upper respiratory infection, unspecified: Secondary | ICD-10-CM | POA: Diagnosis not present

## 2022-05-31 DIAGNOSIS — Z1331 Encounter for screening for depression: Secondary | ICD-10-CM | POA: Diagnosis not present

## 2022-06-08 ENCOUNTER — Ambulatory Visit (INDEPENDENT_AMBULATORY_CARE_PROVIDER_SITE_OTHER): Payer: Medicare Other | Admitting: Psychiatry

## 2022-06-08 DIAGNOSIS — F411 Generalized anxiety disorder: Secondary | ICD-10-CM

## 2022-06-08 NOTE — Progress Notes (Signed)
[facility] Counselor/Therapist Progress Note  Patient ID: Valerie [name], MRN: [id number],    Date: 06/08/2022  Time Spent: 55 minutes                      Treatment Type: Individual Therapy  Reported Symptoms: anxiety, some depression  Mental Status Exam:  Appearance:   Casual and Neat     Behavior:  Appropriate, Sharing, and Motivated  Motor:  Normal  Speech/Language:   Clear and Coherent  Affect:  anxious  Mood:  anxious and depressed  Thought process:  goal directed  Thought content:    Rumination and some obsessive thoughts; overthinking  Sensory/Perceptual disturbances:    WNL  Orientation:  oriented to person, place, time/date, situation, day of week, month of year, year, and stated date of June 08, 2022.  Attention:  Good  Concentration:  Good  Memory:  WNL  Fund of knowledge:   Good  Insight:    Good and Fair  Judgment:   Good  Impulse Control:  Good   Risk Assessment: Danger to Self:  No Self-injurious Behavior: No Danger to Others: No Duty to Warn:no Physical Aggression / Violence:No  Access to Firearms a concern: No  Gang Involvement:No   Subjective:  Patient in for session today and reporting anxiety, depression mostly related to family issues and dynamics. Some anger and frustration "but mostly anxiety and depression." Lots of difficulty with daugher-in-law's treatment of patient and patient feels she has no power in this and can't really say much to daughter-in-law. Lots of inferior feeings and "I don't have the power or strength." Shares similar issues with husband and their communication where she reports he is very controlling, "a narcissist", sometimes "almost bullying towards me." Accentuates that "he's never physically abusive". Husband tells patient is "over-sensitive".  Needed session today to process more of the issues occurring in their marriage, and what patient feels is not-helping versus helping. Recognizing her habit of never questioning  anything at home to "keep the peace". "I'm feeling more worn now due to fear of what-if's, and patient able to name the "what-ifs". Wedges within family relationships are hurtful and controlling. Does not feel she "has much control over any of this." Feels she must be the "peace-keeper" in the family however she is not feeling much peace. Hard to say "no" and set limits and we worked with some role-playing with this in session today. Patient states even thought she is struggling, she knows she is growing and understanding more and more some of the dynamics within her marriage. Feels husband is sometimes spiteful. Patient working more with her communication and feeling some increased confidence/hope for her personally and in marital relationship. Has tended to be more quiet but beginning to communicate her concerns more and trying to do that in non-aggressive ways. Encouraged better self-care, self-talk, and practice more of some of the behaviors, limit-setting, and thought patterns discussed today in session and to work on them between sessions.   Interventions: Cognitive Behavioral Therapy and Ego-Supportive  Long-term goal: Increase understanding of beliefs and messages that produce the worry and anxiety. Short-term goal: Reduce overall level, frequency, and intensity of the anxiety so that daily functioning is not impaired. Strategies: Patient to learn further about anxiety re: daily skills for managing it, and to use the skills effectively in everyday life.   Diagnosis:   ICD-10-CM   1. Generalized anxiety disorder  F41.1      Plan:  Patient today actively participating and showing good motivation in session as she focused more on her issues within family and marriage. Struggles with not being liked nor accepted and a lot relates to her mother-in-law.  Complicated issues remain within the family.  Patient is showing some improvement in her self-confidence and and her insight.  Still working to  have healthier boundaries with others. Patient is making progress and needs to continue working with goal-directed behaviors to continue moving in a forward direction. Encouraged patient and practicing more self affirming and positive behaviors as noted in session including: Decreasing her feelings of inadequacy within the family, reducing her overthinking and over analyzing, staying in the present and focusing on what she can change or control, healthy nutrition and exercise, refrain from assuming negatives and worst-case scenarios, positive self talk, challenging counteract her self-doubt, understand that her anxious thoughts are "just thoughts" and are not necessarily going to play out in reality the way she might anticipate, have more compassion for herself and others, not responding so quickly in conversations that are stressful and really try to hear what the other person is saying before she responds, and recognize the strength she shows working with goal-directed behaviors to move in a direction that supports her improved emotional health and outlook.  Goal review and progress/challenges noted with patient.  Next appointment within 2 weeks.  This record has been created using [employer].  Chart creation errors have been sought, but may not always have been located and corrected.  Such creation errors do not reflect on the standard of medical care provided.   Mathis Fare, LCSW

## 2022-06-11 ENCOUNTER — Other Ambulatory Visit: Payer: Self-pay | Admitting: Behavioral Health

## 2022-06-11 DIAGNOSIS — F411 Generalized anxiety disorder: Secondary | ICD-10-CM

## 2022-06-11 DIAGNOSIS — F5105 Insomnia due to other mental disorder: Secondary | ICD-10-CM

## 2022-06-16 ENCOUNTER — Other Ambulatory Visit: Payer: Self-pay | Admitting: Behavioral Health

## 2022-06-16 DIAGNOSIS — F5105 Insomnia due to other mental disorder: Secondary | ICD-10-CM

## 2022-06-17 DIAGNOSIS — H524 Presbyopia: Secondary | ICD-10-CM | POA: Diagnosis not present

## 2022-06-22 ENCOUNTER — Ambulatory Visit: Payer: Medicare Other | Admitting: Psychiatry

## 2022-06-22 DIAGNOSIS — F411 Generalized anxiety disorder: Secondary | ICD-10-CM | POA: Diagnosis not present

## 2022-06-22 NOTE — Progress Notes (Signed)
[facility] Counselor/Therapist Progress Note  Patient ID: Valerie [name], MRN: [id number],    Date: 06/22/2022  Time Spent: 55 minutes   Treatment Type: Individual Therapy  Reported Symptoms: anxiety heightened and "I don't know why"  Mental Status Exam:  Appearance:   Casual     Behavior:  Appropriate, Sharing, and Motivated  Motor:  Normal  Speech/Language:   Clear and Coherent  Affect:  anxiety  Mood:  anxious  Thought process:  goal directed  Thought content:    Rumination  Sensory/Perceptual disturbances:    WNL  Orientation:  oriented to person, place, time/date, situation, day of week, month of year, year, and stated date of June 22, 2022  Attention:  Good  Concentration:  Good and Fair  Memory:  WNL  Fund of knowledge:   Good  Insight:    Good and Fair  Judgment:   Good  Impulse Control:  Good   Risk Assessment: Danger to Self:  No Self-injurious Behavior: No Danger to Others: No Duty to Warn:no Physical Aggression / Violence:No  Access to Firearms a concern: No  Gang Involvement:No   Subjective:  Patient in today for session and reports anxiety "out the roof" and hypersensitive. States she does not know why her anxiety is higher but also acknowledges increased stress within the family, husband worked more overtime, patient babysitting long hours for son and wife, stressors with home appliances.  Difficulty in communication with husband more recently. Working on better Manufacturing systems engineer and setting better personal boundaries between she and husband. Did well with this in session and needing to be able to use these skills outside of therapy and in her personal relationship with husband. States "I don't allow myself the room to make any errors", which is a problem for her personally and in relationship to others. Worked further on this today in session with patient showing increased awareness of her harsh expectations of herself. Looking at what needs to  change and how to make that happen. State a lot of her extreme beliefs come "from my rigid upbringing". Processed more about her upbringing with patient and some of her awareness of several personality traits that "came from her rigid upbringing". Wants to be able to change her extreme, unrealistic expectations of herself and discussed first steps in such changes. Still conflictual with expectations and other family members. Feels powerless at times. Helped patient identify some of her behaviors that allow others to disrespect her and leads to negative relationships within family. Inferiority gets in her way. Lots of unhealthy communication. Reports relationships "within family include a narcissist, controlling people, almost bullying at times." Patient is told she is "over-sensitive". She tries to keep the peace. Fears "what-ifs". Multiple wedges in family relationships that feel controlling and hurtful. Feels she needs to be the peacemaker.  Some guilt. Tends to be "no resolve". Is working more on communication strategies to try and feel more hope and confidence within family. Encouraged improved self-care, limit setting, and look more for behavior changes she is willing to try and change her situation as she wants "without guilt."  Will pick up on this next week and patient is to do some journaling in between sessions, as we ran out of time today.  Interventions: Cognitive Behavioral Therapy and Ego-Supportive  Long-term goal: Increase understanding of beliefs and messages that produce the worry and anxiety. Short-term goal: Reduce overall level, frequency, and intensity of the anxiety so that daily functioning is not impaired. Strategies: Patient  to learn further about anxiety re: daily skills for managing it, and to use the skills effectively in everyday life.   Diagnosis:   ICD-10-CM   1. Generalized anxiety disorder  F41.1      Plan:  Patient actively participated in session today as she  focused on her anxiety and difficult communication and expectations within the family.  Patient is making progress and needs to continue working with goal-directed behaviors in order to keep moving in a forward direction. Encouraged patient in her practice of more self-affirming and positive behaviors as noted in session including: working to decrease her feelings of inadequacy within the family, reducing her overthinking and over analyzing, staying in the present and focusing on what she can change or control, healthy nutrition and exercise, refrain from assuming negatives and worst-case scenarios, positive self-talk, challenge and counteract her self-doubt, understand that her anxious thoughts are "just thoughts" and are not necessarily going to play out in reality the way she might anticipate, have more compassion for herself and others, not responding so quickly in conversation that are stressful ad really try to hear what the other person is saying before she responds, and recognize the strength she shows working with goal-directed behaviors to move in a direction that supports her improved overall emotional health and wellbeing.  Goal review and progress/challenges noted with patient.  Next appt within 2-3 weeks.  This record has been created using [employer].  Chart creation errors have been sought, but may not always have been located and corrected.  Such creation errors do not reflect on the standard of medical care provided.   Mathis Fare, LCSW

## 2022-07-01 ENCOUNTER — Other Ambulatory Visit: Payer: Self-pay | Admitting: Family Medicine

## 2022-07-01 DIAGNOSIS — F3341 Major depressive disorder, recurrent, in partial remission: Secondary | ICD-10-CM | POA: Diagnosis not present

## 2022-07-01 DIAGNOSIS — M810 Age-related osteoporosis without current pathological fracture: Secondary | ICD-10-CM

## 2022-07-01 DIAGNOSIS — Z79899 Other long term (current) drug therapy: Secondary | ICD-10-CM | POA: Diagnosis not present

## 2022-07-01 DIAGNOSIS — Z0000 Encounter for general adult medical examination without abnormal findings: Secondary | ICD-10-CM | POA: Diagnosis not present

## 2022-07-01 DIAGNOSIS — Z1231 Encounter for screening mammogram for malignant neoplasm of breast: Secondary | ICD-10-CM

## 2022-07-01 DIAGNOSIS — E890 Postprocedural hypothyroidism: Secondary | ICD-10-CM | POA: Diagnosis not present

## 2022-07-01 DIAGNOSIS — Z5320 Procedure and treatment not carried out because of patient's decision for unspecified reasons: Secondary | ICD-10-CM | POA: Diagnosis not present

## 2022-07-01 DIAGNOSIS — Z136 Encounter for screening for cardiovascular disorders: Secondary | ICD-10-CM | POA: Diagnosis not present

## 2022-07-01 DIAGNOSIS — E538 Deficiency of other specified B group vitamins: Secondary | ICD-10-CM | POA: Diagnosis not present

## 2022-07-01 DIAGNOSIS — E559 Vitamin D deficiency, unspecified: Secondary | ICD-10-CM | POA: Diagnosis not present

## 2022-07-06 ENCOUNTER — Ambulatory Visit (INDEPENDENT_AMBULATORY_CARE_PROVIDER_SITE_OTHER): Payer: Medicare Other | Admitting: Psychiatry

## 2022-07-06 DIAGNOSIS — F411 Generalized anxiety disorder: Secondary | ICD-10-CM | POA: Diagnosis not present

## 2022-07-06 NOTE — Progress Notes (Signed)
[facility] Counselor/Therapist Progress Note  Patient ID: Valerie [name], MRN: [id number],    Date: 07/06/2022  Time Spent: 55 minutes   Treatment Type: Individual Therapy  Reported Symptoms: anxiety, depression "but anxiety is much stronger symptom"  Mental Status Exam:  Appearance:   Casual     Behavior:  Appropriate, Sharing, and Motivated  Motor:  Normal  Speech/Language:   Clear and Coherent  Affect:  Depressed and anxious  Mood:  anxious and depressed  Thought process:  goal directed  Thought content:    Rumination and some obsessive thoughts  Sensory/Perceptual disturbances:    WNL  Orientation:  oriented to person, place, time/date, situation, day of week, month of year, year, and stated date of Jul 06, 2022  Attention:  Good  Concentration:  Good and Fair  Memory:  WNL  Fund of knowledge:   Good  Insight:    Good and Fair  Judgment:   Good and Fair  Impulse Control:  Good   Risk Assessment: Danger to Self:  No Self-injurious Behavior: No Danger to Others: No Duty to Warn:no Physical Aggression / Violence:No  Access to Firearms a concern: No  Gang Involvement:No   Subjective:   Patient in today and reports anxiety and depression mostly related to personal and family relationships, and "not being able to communicate openly and honestly." Also some issues in a friendship of about 35 yrs that are hurtful and patient doesn't know how to handle them. Shared hurtful behaviors and words that she has endured over 9-10 years, and processed these more in session today, realizing she need to let go of this in order to keep moving forward and becoming healthier for herself and within her family. Needing session today to share and work further on specific family issues and especially with a son. Difficulty with limits and boundaries and feeling very hurt by younger son age 64. Very hard for patient to not let other manipulate her within the family and ends up feeling  unvalued. Looked at ways she can be more direct with family and feel more confident about it, without guilt. Working further on specific boundary issues within the family and trying to interrupt her allowing others to use her and treat her disrespectfully. History of difficult family relationships over the years "including controlling people". Hard to confront wedges within the family. Did some role play in session re: better communication and setting of healthier boundaries, which seem to be helpful to patient.  Continues working on improving her own self talk and self-care, limit setting, and communicating within the family.  Interventions: Cognitive Behavioral Therapy and Ego-Supportive  Long-term goal: Increase understanding of beliefs and messages that produce the worry and anxiety. Short-term goal: Reduce overall level, frequency, and intensity of the anxiety so that daily functioning is not impaired. Strategies: Patient to learn further about anxiety re: daily skills for managing it, and to use the skills effectively in everyday life.    Diagnosis:   ICD-10-CM   1. Generalized anxiety disorder  F41.1      Plan: Patient today participating well in session working further on her anxiety, communication, and relationships within the family, especially in the midst of significant hurt within the family and her difficulty setting limits on this. Patient is showing progress in her follow-through on goal-directed behaviors to keep moving in a forward direction. Encouraged patient and practicing more positive and self affirming behaviors as discussed in session including: Reducing her overthinking and over analyzing,  stay in the present and focused on what she can change or control, working to decrease her feelings of inadequacy within the family, healthy nutrition and exercise, refrain from assuming negatives and worst-case scenarios, positive self talk, challenging counteract her self-doubt,  understand that her anxious thoughts are "just thoughts" and are not necessarily going to play out in reality the way she might anticipate, have more compassion for herself and others, not responding so quickly in conversations that are stressful and really try to hear what the other person is saying before she responds, and realize the strength she shows working with goal-directed behaviors to move in a direction that supports her improved overall emotional health and outlook.  Self rating goals: 1-10 depression scale-3 1-10 anxiety scale-7 1-10 hopefulness scale-4  Goal review and progress/challenges noted with patient.  Next appointment within 2 weeks.   Mathis Fare, LCSW

## 2022-07-13 ENCOUNTER — Ambulatory Visit (INDEPENDENT_AMBULATORY_CARE_PROVIDER_SITE_OTHER): Payer: Medicare Other | Admitting: Behavioral Health

## 2022-07-13 ENCOUNTER — Encounter: Payer: Self-pay | Admitting: Behavioral Health

## 2022-07-13 DIAGNOSIS — F411 Generalized anxiety disorder: Secondary | ICD-10-CM | POA: Diagnosis not present

## 2022-07-13 DIAGNOSIS — F331 Major depressive disorder, recurrent, moderate: Secondary | ICD-10-CM | POA: Diagnosis not present

## 2022-07-13 DIAGNOSIS — F99 Mental disorder, not otherwise specified: Secondary | ICD-10-CM

## 2022-07-13 DIAGNOSIS — F5105 Insomnia due to other mental disorder: Secondary | ICD-10-CM

## 2022-07-13 MED ORDER — FLUOXETINE HCL 20 MG PO CAPS: 80.0000 mg | ORAL_CAPSULE | Freq: Every day | ORAL | 3 refills | Status: DC

## 2022-07-13 MED ORDER — QUETIAPINE FUMARATE 100 MG PO TABS: 100.0000 mg | ORAL_TABLET | Freq: Every day | ORAL | 3 refills | Status: DC

## 2022-07-13 MED ORDER — TEMAZEPAM 30 MG PO CAPS: 30.0000 mg | ORAL_CAPSULE | Freq: Every day | ORAL | 0 refills | Status: DC

## 2022-07-13 MED ORDER — ALPRAZOLAM 0.5 MG PO TABS: 0.5000 mg | ORAL_TABLET | Freq: Two times a day (BID) | ORAL | 0 refills | Status: DC | PRN

## 2022-07-13 MED ORDER — MIRTAZAPINE 7.5 MG PO TABS: 7.5000 mg | ORAL_TABLET | Freq: Every day | ORAL | 1 refills | Status: DC

## 2022-07-13 NOTE — Progress Notes (Signed)
[facility] Med Check  Patient ID: Valerie [name],  MRN: [id number]  PCP: [name], MD  Date of Evaluation: 07/13/2022 Time spent:30 minutes  Chief Complaint:  Chief Complaint   Anxiety; Depression; Follow-up; Patient Education; Medication Refill; Panic Attack; Insomnia     HISTORY/CURRENT STATUS: HPI  "Valerie [name]", 62 year old female presents to this office for follow up and medication management.  Says that her medication regimen continues to be fine. Still complaining of sleeping problems. Seroquel increase helped but not enough. She is requesting additional help.  She continues to see Rockne Menghini for therapy.  She says that her anxiety today is 2of 10, and her depression is 2 of 10.  She is sleeping 7-8 hours every night with the aid of Restoril. Sleep is broken, wakes up often. She denies any history of mania, no psychosis, no history of auditory or visual hallucinations.  She denies SI or HI.   Prior psychiatric medication trial: Prozac Xanax Temazepam   Individual Medical History/ Review of Systems: Changes? :No   Allergies: Oxycodone-acetaminophen and Penicillins  Current Medications:  Current Outpatient Medications:    mirtazapine (REMERON) 7.5 MG tablet, Take 1 tablet (7.5 mg total) by mouth at bedtime., Disp: 30 tablet, Rfl: 1   ALPRAZolam (XANAX) 0.5 MG tablet, Take 1 tablet (0.5 mg total) by mouth 2 (two) times daily as needed., Disp: 180 tablet, Rfl: 0   docusate sodium (COLACE) 100 MG capsule, Take 100 mg by mouth daily., Disp: , Rfl:    FLUoxetine (PROZAC) 20 MG capsule, Take 4 capsules (80 mg total) by mouth daily., Disp: 120 capsule, Rfl: 3   folic acid (FOLVITE) 1 MG tablet, Take 1 mg by mouth daily., Disp: , Rfl:    levothyroxine (SYNTHROID) 75 MCG tablet, Take 75 mcg by mouth daily before breakfast., Disp: , Rfl:    LINZESS 290 MCG CAPS capsule, Take 290 mcg by mouth every morning., Disp: , Rfl:    omeprazole (PRILOSEC) 40 MG capsule, Take 40 mg by  mouth daily as needed (heartburn)., Disp: , Rfl:    potassium chloride SA (KLOR-CON M) 20 MEQ tablet, Take 2 tablets (40 mEq total) by mouth daily for 3 days., Disp: 6 tablet, Rfl: 0   QUEtiapine (SEROQUEL) 100 MG tablet, Take 1 tablet (100 mg total) by mouth at bedtime., Disp: 30 tablet, Rfl: 3   QUEtiapine (SEROQUEL) 25 MG tablet, TAKE 2 TABLETS BY MOUTH AT BEDTIME, Disp: 180 tablet, Rfl: 0   senna (SENOKOT) 8.6 MG TABS tablet, Take 4 tablets by mouth in the morning., Disp: , Rfl:    temazepam (RESTORIL) 30 MG capsule, Take 1 capsule (30 mg total) by mouth at bedtime., Disp: 90 capsule, Rfl: 0 Medication Side Effects: none  Family Medical/ Social History: Changes? No  MENTAL HEALTH EXAM:  There were no vitals taken for this visit.There is no height or weight on file to calculate BMI.  General Appearance: Casual and Neat  Eye Contact:  Good  Speech:  Clear and Coherent  Volume:  Normal  Mood:  Angry  Affect:  Appropriate  Thought Process:  Coherent  Orientation:  Full (Time, Place, and Person)  Thought Content: Logical   Suicidal Thoughts:  No  Homicidal Thoughts:  No  Memory:  WNL  Judgement:  Good  Insight:  Good  Psychomotor Activity:  Normal  Concentration:  Concentration: Good  Recall:  Good  Fund of Knowledge: Good  Language: Good  Assets:  Desire for Improvement  ADL's:  Intact  Cognition: WNL  Prognosis:  Good    DIAGNOSES:    ICD-10-CM   1. Major depressive disorder, recurrent episode, moderate (HCC)  F33.1 FLUoxetine (PROZAC) 20 MG capsule    2. Generalized anxiety disorder  F41.1 FLUoxetine (PROZAC) 20 MG capsule    ALPRAZolam (XANAX) 0.5 MG tablet    QUEtiapine (SEROQUEL) 100 MG tablet    3. Insomnia due to other mental disorder  F51.05 QUEtiapine (SEROQUEL) 100 MG tablet   F99 temazepam (RESTORIL) 30 MG capsule    mirtazapine (REMERON) 7.5 MG tablet      Receiving Psychotherapy: No    RECOMMENDATIONS:  Greater than 50% of  30 min face to face  time with patient was spent on counseling and coordination of care. We talked about her current stability. She is still doing well but not sleeping well at night.   No other medication changes were necessary at this time.  We agreed to; Will continue Xanax 0.5 mg twice daily as needed for severe anxiety To continue Prozac 80 mg daily.  Patient is taking four 20 mg tablets To continue Seroquel 100  mg at bedtime.  To continue temazepam 30 mg capsule at bedtime for sleep. Will start Mirtazapine 7.5 mg at bedtime Pt denies dx of prolonged QT interval. I discussed with her and she is not aware. To discuss with PCP next visit. We will report side effects or worsening symptoms promptly Provided emergency contact information We will follow-up in 3 months as required to continue medications. Discussed potential benefits, risk, and side effects of benzodiazepines to include potential risk of tolerance and dependence, as well as possible drowsiness.  Advised patient not to drive if experiencing drowsiness and to take lowest possible effective dose to minimize risk of dependence and tolerance.  Discussed potential metabolic side effects associated with atypical antipsychotics, as well as potential risk for movement side effects. Advised pt to contact office if movement side effects occur.   Reviewed PDMP    Joan Flores, NP

## 2022-07-22 DIAGNOSIS — K5904 Chronic idiopathic constipation: Secondary | ICD-10-CM | POA: Diagnosis not present

## 2022-07-22 DIAGNOSIS — K224 Dyskinesia of esophagus: Secondary | ICD-10-CM | POA: Diagnosis not present

## 2022-07-30 ENCOUNTER — Ambulatory Visit: Payer: Medicare Other | Admitting: Psychiatry

## 2022-07-30 DIAGNOSIS — F411 Generalized anxiety disorder: Secondary | ICD-10-CM | POA: Diagnosis not present

## 2022-07-30 NOTE — Progress Notes (Signed)
[facility] Counselor/Therapist Progress Note  Patient ID: Valerie [name], MRN: [id number],    Date: 07/30/2022  Time Spent: 55 minutes   Treatment Type: Individual Therapy  Reported Symptoms: anxiety, "jittery", easily distracted re: my anxiety level heightened  Mental Status Exam:  Appearance:   Casual     Behavior:  Appropriate, Sharing, and Motivated  Motor:  Normal  Speech/Language:   Clear and Coherent  Affect:  anxious  Mood:  anxious and "worries"  Thought process:  goal directed  Thought content:    Some obsessiveness at home with cleanliness  Sensory/Perceptual disturbances:    WNL  Orientation:  oriented to person, place, time/date, situation, day of week, month of year, year, and stated date of Jul 30, 2022  Attention:  Fair  Concentration:  Fair  Memory:  WNL  Fund of knowledge:   Good  Insight:    Fair  Judgment:   Good and Fair  Impulse Control:  Fair, Poor, and especially with shopping   Risk Assessment: Danger to Self:  No Self-injurious Behavior: No Danger to Others: No Duty to Warn:no Physical Aggression / Violence:No  Access to Firearms a concern: No  Gang Involvement:No   Subjective:   Patient in for session today and reporting anxiety along with some depression.  Her stressful personal and family relationship situations have continued and make communication quite difficult between various family members including patient. Picking up from last session, patient shared that she did need to end the problematic relationship discussed earlier as "it just wasn't healthy." Still hurt by 8 yr old son and he is not returning her calls. Shares more of the problematic family history in session today but also seeing some positives within some aspects of the family now. Communication skill very important within family and patient is trying to give that more importance and attention. States she wished she felt more empowered within the family as I would speak up  more often. Feels her "previously being very overweight had a lot of impact on how I was raised and how I felt about myself and has followed me over the years. Had weight loss surgery 10 yrs ago and shares how that has impacted her also within her family and beyond, and shared more about this today "as I've tended to keep it to myself". Wanting more respect within family and wanting to have better relationship with younger son age 64 yrs old. Did more work today re: healthier boundaries and more effective communication.   Interventions: Cognitive Behavioral Therapy and Ego-Supportive  Long-term goal: Increase understanding of beliefs and messages that produce the worry and anxiety. Short-term goal: Reduce overall level, frequency, and intensity of the anxiety so that daily functioning is not impaired. Strategies: Patient to learn further about anxiety re: daily skills for managing it, and to use the skills effectively in everyday life.    Diagnosis:   ICD-10-CM   1. Generalized anxiety disorder  F41.1      Plan:   Patient showing active participation in session today as she focused more on her anxiety, relationships within the family, hurtful family situations recently, and communication.  Family relationships very important but difficult for patient. Patient does show progress especially in following through on strategies we discussed in sessions.  Needs to continue her work with goal-directed behaviors to keep moving in a forward direction. Encouraged patient in practicing more positive and self affirming behaviors as discussed in session including: Reducing her overthinking and over analyzing,  remain in the present and focused on what she can change or control, challenge and counteract her self-doubt, understand that her anxious thoughts are "just thoughts" and are not necessarily going to play out in reality the way she might anticipate, work to decrease her feelings of inadequacy within the  family, healthy nutrition and exercise, refrain from assuming worst-case scenarios, use of positive self talk, have more compassion for herself and others, and recognize the strength that she shows working with goal-directed behaviors to move in a direction that supports her improved overall emotional health and wellbeing.  Self rating scales: 1-10 depression scale-5/6 1-10 anxiety scale-8/9 1-10 self-esteem scale-7/8 1-10 motivation scale-8 1-10 hopefulness scale-5  Goal review and progress/challenges noted with patient.  Next appointment within 2 to 3 weeks.   Valerie Fare, LCSW

## 2022-08-20 ENCOUNTER — Ambulatory Visit: Payer: Medicare Other | Admitting: Psychiatry

## 2022-08-24 ENCOUNTER — Ambulatory Visit (INDEPENDENT_AMBULATORY_CARE_PROVIDER_SITE_OTHER): Payer: Medicare Other | Admitting: Psychiatry

## 2022-08-24 DIAGNOSIS — F411 Generalized anxiety disorder: Secondary | ICD-10-CM | POA: Diagnosis not present

## 2022-08-24 NOTE — Progress Notes (Signed)
[facility] Counselor/Therapist Progress Note  Patient ID: Valerie [name], MRN: [id number],    Date: 08/24/2022  Time Spent: 55 minutes   Treatment Type: Individual Therapy  Reported Symptoms: anxiety, frustration, some anger, depression, difficulty with communication issues within family  Mental Status Exam:  Appearance:   Casual     Behavior:  Appropriate, Sharing, and Motivated  Motor:  Normal  Speech/Language:   Clear and Coherent  Affect:  Depressed and anxious, frustrated  Mood:  angry, anxious, and depressed  Thought process:  goal directed  Thought content:    Rumination and some obsessive thought  Sensory/Perceptual disturbances:    WNL  Orientation:  oriented to person, place, time/date, situation, day of week, month of year, year, and stated date of August 24, 2022  Attention:  Fair  Concentration:  Good and Fair  Memory:  WNL  Fund of knowledge:   Good  Insight:    Good and Fair  Judgment:   Good  Impulse Control:  Good   Risk Assessment: Danger to Self:  No Self-injurious Behavior: No Danger to Others: No Duty to Warn:no Physical Aggression / Violence:No  Access to Firearms a concern: No  Gang Involvement:No   Subjective:  Patient in session today reporting continued family relationship distress, anxiety, and some depression. Symptoms and mood very related to difficulty in relationships with son, husband, daughter in law, mother-in Social worker. Denies any SI or HI. Continues personal issues with 35 yr old son and still not speaking with patient "except at times when son communicates to mother through her husband. Very bitter with mother-in-law and not receptive to being in contact or around her. Angry and hurt by her elderly mother, father and processed a lot of this hurt in session today. Easy to get pulled into situations and difficult to step into "family and marital issues. Communication skills discussed more today including not assuming without asking questions,  lack of trust in some family relationships. Did well in being willing to look at her frustration and anger in different way. Continued work on her communication skills and a tolerance for when things do not go as planned which we practiced in session today which she felt was helpful.  Interventions: Cognitive Behavioral Therapy and Ego-Supportive  Long-term goal: Increase understanding of beliefs and messages that produce the worry and anxiety. Short-term goal: Reduce overall level, frequency, and intensity of the anxiety so that daily functioning is not impaired. Strategies: Patient to learn further about anxiety re: daily skills for managing it, and to use the skills effectively in everyday life.   Diagnosis:   ICD-10-CM   1. Generalized anxiety disorder  F41.1      Plan:  Patient today showing good participation in session as she worked further on her troubled and hurtful relationships within the family, anxiety, anger, some depression, and communication issues.  Did some really good work today on better responses in difficult relationships within the family.  Showing better motivation today and needs to continue working with goal-directed behaviors to keep moving in a positive direction. Encouraged patient in her practice of more positive and self affirming behaviors as noted in session including: Interrupt and decrease her overthinking and over analyzing, stay in the present focused on what she can change or control, challenge and counteract her self-doubt, understand that her anxious thoughts are "just thoughts" and are not necessarily going to play out in reality the way she might anticipate, work to decrease her feelings of inadequacy within  the family, healthy nutrition and exercise, refrain from assuming worst-case scenarios, use of positive self talk, have more compassion for herself and others, and realize the strength that she shows working with goal-directed behaviors to move in a  direction that supports her improved overall emotional health and wellbeing.  Self rating scales: (updated August 24, 2022) 1-10 depression scale-5/6 1-10 anxiety scale-8 1-10 self-esteem scale-7 1-10 motivation scale-8/9 1-10 hopefulness scale-5/6  Goal review and progress/challenges noted with patient.  Next appointment within 3 weeks.   Mathis Fare, LCSW

## 2022-09-01 ENCOUNTER — Ambulatory Visit: Payer: Medicare Other | Admitting: Psychiatry

## 2022-09-07 ENCOUNTER — Ambulatory Visit: Payer: Medicare Other | Admitting: Psychiatry

## 2022-09-07 ENCOUNTER — Telehealth: Payer: Self-pay | Admitting: Behavioral Health

## 2022-09-07 DIAGNOSIS — F411 Generalized anxiety disorder: Secondary | ICD-10-CM

## 2022-09-07 DIAGNOSIS — F5105 Insomnia due to other mental disorder: Secondary | ICD-10-CM

## 2022-09-07 MED ORDER — QUETIAPINE FUMARATE 100 MG PO TABS: 100.0000 mg | ORAL_TABLET | Freq: Every day | ORAL | 3 refills | Status: DC

## 2022-09-07 MED ORDER — MIRTAZAPINE 7.5 MG PO TABS: 7.5000 mg | ORAL_TABLET | Freq: Every day | ORAL | 1 refills | Status: DC

## 2022-09-07 NOTE — Telephone Encounter (Signed)
Pt was in the office today she needs refills on her seroquel 25 mg and her mirtazapine 7.5 mg. Pharmacy is [facility] [address] [place]

## 2022-09-07 NOTE — Progress Notes (Signed)
[facility] Counselor/Therapist Progress Note  Patient ID: Valerie [name], MRN: [id number],    Date: 09/07/2022  Time Spent: 48 minutes   Treatment Type: Individual Therapy  Reported Symptoms: frustration, some anger, anxiety, depression ("not as bad"), communication issues mostly within family  Mental Status Exam:  Appearance:   Neat     Behavior:  Appropriate, Sharing, and Motivated  Motor:  Normal  Speech/Language:   Clear and Coherent  Affect:  Depressed and anxious, irritated, frustrated  Mood:  angry, anxious, and depressed  Thought process:  goal directed  Thought content:    Some obsessive thoughts  Sensory/Perceptual disturbances:    WNL  Orientation:  oriented to person, place, time/date, situation, day of week, month of year, year, and stated date of September 07, 2022  Attention:  Good  Concentration:  Good and Fair  Memory:  WNL  Fund of knowledge:   Good  Insight:    Good and Fair  Judgment:   Good  Impulse Control:  Good   Risk Assessment: Danger to Self:  No Self-injurious Behavior: No Danger to Others: No Duty to Warn:no Physical Aggression / Violence:No  Access to Firearms a concern: No  Gang Involvement:No   Subjective:  Patient reporting in session today irritability, anxiety, depression, frustration, anger, and family communication issues (especially on her part as she "is wanting it to be better").  Denies any SI/HI.  "At odds more so with son age 23 and we've had some major altercations verbally not physically".  Processed some of her altercations regarding a recent visit to see sign in [place], and ended up contracting over childhood issues/miscommunication/labeling within the family, weak boundaries, and family stress.  Also involved in the conversation that led to altercation, or other family members including a sister.  Patient states that she herself was able to remain "more calm than usual but definitely not unaffected either".  States that  family did not really come to any mutual agreements but did finally stop the conversation on the sensitive issues that had led to verbal altercation.  Patient processed this more in session today noting that there is considerable continued distress and anxiety within family relationships.  Most of her difficulties are reportedly with husband, son, daughter-in-law, and mother-in-law who have not been willing to talk through their shared issues amongst each other and or with a therapist.  64 year old son who had not previously been speaking with patient is doing so at times now however not to any length.  Patient continues to feel hurt by her own elderly parents as processed some in last session.  Worked further today on communication skills particularly in setting necessary boundaries at times and also regarding her distressed and certain family relationships.  Has made some progress particularly on managing her anger and communicating more effectively when angry "and not letting it control me".  Some lack of trust remains in certain family relationships and at times, and patient feels she currently is trusting as much as she can." (Not all details included in this note due to patient privacy needs.)  Interventions: Cognitive Behavioral Therapy, Solution-Oriented/Positive Psychology, and Ego-Supportive  Long-term goal: Increase understanding of beliefs and messages that produce the worry and anxiety. Short-term goal: Reduce overall level, frequency, and intensity of the anxiety so that daily functioning is not impaired. Strategies: Patient to learn further about anxiety re: daily skills for managing it, and to use the skills effectively in everyday life.   Diagnosis:   ICD-10-CM  1. Generalized anxiety disorder  F41.1      Plan:  Patient today participating actively in session working on her personal and family relationships that are quite hurtful and troubling for patient including relationships  with husband and adult sons/wives. Showing good motivation and strength, and is making progress. Needs to continues working with goal-directed behaviors in order to keep moving in a healthier and positive direction. Encouraged patient in practicing more positive and self-affirming behaviors as discussed in session including: Interrupting and decreasing her overthinking and over-analyzing, stay in the present focused on what she can change or control, challenge and counteract her self-doubt, understand that her anxious thoughts are "just thoughts" and are not necessarily going to play out in reality the way she might anticipate, work to decrease her feelings of inadequacy within family, healthy nutrition and exercise, refrain from assuming worst-case scenarios, use of positive self-talk, have more compassion for herself and others, and realize the strength she shows working with goal-directed behaviors to move in a direction that supports her improved overall emotional health and wellbeing.   Self rating scales: (updated September 07, 2022) 1-10 depression scale-5 1-10 anxiety scale-8 1-10 self-esteem scale-7 1-10 motivation scale-8 1-10 hopefulness scale-5/6  Goal review and progress/challenges noted with patient.  Next appt within 1-2 weeks.   Mathis Fare, LCSW

## 2022-09-07 NOTE — Telephone Encounter (Signed)
Sent!

## 2022-09-11 ENCOUNTER — Other Ambulatory Visit: Payer: Self-pay | Admitting: Behavioral Health

## 2022-09-11 DIAGNOSIS — F5105 Insomnia due to other mental disorder: Secondary | ICD-10-CM

## 2022-09-11 DIAGNOSIS — F411 Generalized anxiety disorder: Secondary | ICD-10-CM

## 2022-09-15 ENCOUNTER — Ambulatory Visit (INDEPENDENT_AMBULATORY_CARE_PROVIDER_SITE_OTHER): Payer: Medicare Other | Admitting: Psychiatry

## 2022-09-15 DIAGNOSIS — F411 Generalized anxiety disorder: Secondary | ICD-10-CM

## 2022-09-15 NOTE — Progress Notes (Signed)
[facility] Counselor/Therapist Progress Note  Patient ID: Valerie [name], MRN: [id number],    Date: 09/15/2022  Time Spent: 53 minutes   Treatment Type: Individual Therapy  Reported Symptoms: Anxiety, some anger, frustration, depression "but not as bad", family communication issues. Mental Status Exam:  Appearance:   Casual     Behavior:  Appropriate, Sharing, and Motivated  Motor:  Normal  Speech/Language:   Clear and Coherent  Affect:  Depressed and anxious  Mood:  anxious, depressed, and irritable  Thought process:  goal directed  Thought content:    Rumination  Sensory/Perceptual disturbances:    WNL  Orientation:  oriented to person, place, time/date, situation, day of week, month of year, year, and stated date of September 15, 2022  Attention:  Good  Concentration:  Good and Fair  Memory:  WNL  Fund of knowledge:   Good  Insight:    Good and Fair  Judgment:   Good  Impulse Control:  Good and Fair   Risk Assessment: Danger to Self:  No Self-injurious Behavior: No Danger to Others: No Duty to Warn:no Physical Aggression / Violence:No  Access to Firearms a concern: No  Gang Involvement:No   Subjective:  Patient in session today reporting anxiety, feeling insignificant within family because of other family member's behaviors and communication, depression mostly related to personal and family concerns that she has been working on outside of therapy. Picks up her last session to continue today her work on these issues as stated above along with dealing with hurts more directly rather than "always holding onto them".  Patient admits significant difficulty" letting anything go" in order for her to move forward, but rather tends to hang onto her hurts/disappointments/anger/stress which ends up being hurtful to her even more.  Starting to realize it is more of which is a good thing.  Worked more on this today as she talked through her feelings/acknowledgment that by holding onto  her hurts/anger/depression and not letting go, it almost always leads her to feel worse and resentful.  Did well and working on this today although it is difficult for her to make changes.  Also looking at some communication changes that she needs to make to help with having better boundari  Interventions: Cognitive Behavioral Therapy and Ego-Supportive  Long-term goal: Increase understanding of beliefs and messages that produce the worry and anxiety. Short-term goal: Reduce overall level, frequency, and intensity of the anxiety so that daily functioning is not impaired. Strategies: Patient to learn further about anxiety re: daily skills for managing it, and to use the skills effectively in everyday life.   Diagnosis:   ICD-10-CM   1. Generalized anxiety disorder  F41.1      Plan: Patient today motivated and showing good participation in session working further on her family and personal relationships that have been very troubling and hurtful for patient especially relationships with adult sons/wives and her husband.  Some improvement at times in communication with husband.  Showing some increased strength and patient is making gradual progress.  She needs to continue working with goal-directed behaviors in order to keep herself moving in a healthier, positive direction. Encouraged patient and practicing more positive and self affirming behaviors as discussed in session including: Interrupt and decrease her overthinking and over analyzing, remain in the present focused on what she can change or control, challenge and counteract her self-doubt, understand that her anxious thoughts are "just thoughts" and are not necessarily going to play out in reality  the way she might anticipate, decrease her feelings of inadequacy within the family, healthy nutrition and exercise, refrain from assuming worst-case scenarios, use of positive self talk, have more compassion for herself and others, and recognize the  strength she shows working with goal-directed behaviors to move in a direction that supports her improved overall emotional health and outlook for the future.  Self rating scales: (updated September 15, 2022) 1-10 depression scale-5 1-10 anxiety scale-8 1-10 self-esteem scale-6 1-10 motivation scale-8 1-10 hopefulness scale-5/6  Goal review and progress/challenges noted with patient.  Next appointment within 2 weeks.   Mathis Fare, LCSW

## 2022-09-21 ENCOUNTER — Ambulatory Visit: Payer: Medicare Other | Admitting: Psychiatry

## 2022-09-21 DIAGNOSIS — F411 Generalized anxiety disorder: Secondary | ICD-10-CM | POA: Diagnosis not present

## 2022-09-21 NOTE — Progress Notes (Signed)
[facility] Counselor/Therapist Progress Note  Patient ID: Valerie [name], MRN: [id number],    Date: 09/21/2022  Time Spent: 48 minutes   Treatment Type: Individual Therapy  Reported Symptoms: anxiety, anger, frustration  Mental Status Exam:  Appearance:   Casual and Neat     Behavior:  Appropriate, Sharing, and Motivated  Motor:  Normal  Speech/Language:   Clear and Coherent  Affect:  Anxious, angry  Mood:  angry and anxious  Thought process:  goal directed  Thought content:    WNL  Sensory/Perceptual disturbances:    WNL  Orientation:  oriented to person, place, time/date, situation, day of week, month of year, year, and stated date of September 21, 2022  Attention:  Good/sometimes Fair  Concentration:  Good and Fair  Memory:  WNL  Fund of knowledge:   Good  Insight:    Good and Fair  Judgment:   Good  Impulse Control:  Good   Risk Assessment: Danger to Self:  No Self-injurious Behavior: No Danger to Others: No Duty to Warn:no Physical Aggression / Violence:No  Access to Firearms a concern: No  Gang Involvement:No   Subjective: Patient in today for session and reporting some increased anxiety recently due to family conflicts increased particularly over past week.  Patient feeling insignificant at times when compared to other family members and becomes intimidated at times by their behavior, outbursts, negative comments, all of which tend to feed patient's anxiety.  Denies any physically threatening behaviors at home, but all verbal.  "The family can be very loud" when talking.  Worked on some ways that patient might be able to help diffuse the loudness and actually lead to improved communication and understanding amongst family members, explaining how this would not happen suddenly but she could impact the communication amongst family and positive ways as noted today in session.  Also role-played some healthy communication styles and dealing with unhealthy communication.   Significant recent family struggles and also some avoidance in the midst of family conflict.  Help patient discover ways that she can communicate and feel positive about it and perhaps increase the likelihood that she may be heard better based on the way she actually speaks.  Role-played this in session also and was able to see how not only it could impact her speaking but also her mood could be improved and less anxious.  Interventions: Cognitive Behavioral Therapy and Ego-Supportive  Long-term goal: Increase understanding of beliefs and messages that produce the worry and anxiety. Short-term goal: Reduce overall level, frequency, and intensity of the anxiety so that daily functioning is not impaired. Strategies: Patient to learn further about anxiety re: daily skills for managing it, and to use the skills effectively in everyday life.   Diagnosis:   ICD-10-CM   1. Generalized anxiety disorder  F41.1      Plan: Patient in session today and showing good participation especially in her willingness to try some new ways of communicating and setting healthy limits.  Is making progress, showing some gradual increase in strength, and needs to continue working with her goal-directed behaviors to keep moving and a forward direction. Encouraged patient in practicing more positive and self affirming behaviors as noted in session including: Stay in the present focused on what she can change or control, challenge and counteract her self-doubt, understand that her anxious thoughts are "just thoughts" and are not necessarily going to play out in reality the way she might fear, decrease her feelings of inadequacy within  the family, healthy nutrition and exercise, interrupt and decrease her overthinking and over analyzing, refrain from assuming worst-case scenarios, use of more positive self talk, work more intentionally on setting healthier boundaries, and recognize the strength she shows working with goal-directed  behaviors to move in a direction that supports her improved overall emotional health and wellbeing.  Self rating scales: (updated September 21, 2022) 1-10 depression scale-5 1-10 anxiety scale-8 1-10 self-esteem scale-6 1-10 motivation scale-7 1-10 hopefulness scale-6   Mathis Fare, LCSW

## 2022-09-21 NOTE — Patient Instructions (Signed)
48 m

## 2022-09-29 ENCOUNTER — Ambulatory Visit: Payer: Medicare Other | Admitting: Psychiatry

## 2022-10-05 ENCOUNTER — Ambulatory Visit (INDEPENDENT_AMBULATORY_CARE_PROVIDER_SITE_OTHER): Payer: Medicare Other | Admitting: Psychiatry

## 2022-10-05 DIAGNOSIS — F411 Generalized anxiety disorder: Secondary | ICD-10-CM

## 2022-10-05 NOTE — Progress Notes (Signed)
[facility] Counselor/Therapist Progress Note  Patient ID: Valerie [name], MRN: [id number],    Date: 10/05/2022  Time Spent: 55 minutes   Treatment Type: Individual Therapy  Reported Symptoms: "a lot anxiety" and "some of it may be self-driven and others my head is just spewing out things", depressed and "I get worried, sometimes calm down but it comes back", some frustration and anger   Mental Status Exam:  Appearance:   Casual     Behavior:  Appropriate, Sharing, and Motivated  Motor:  Normal  Speech/Language:   Clear and Coherent  Affect:  Depressed and anxious, frustrated  Mood:  anxious and depressed  Thought process:  goal directed  Thought content:    WNL  Sensory/Perceptual disturbances:    WNL  Orientation:  oriented to person, place, time/date, situation, day of week, month of year, year, and stated date of October 05, 2022  Attention:  Good  Concentration:  Fair  Memory:  WNL  Fund of knowledge:   Good  Insight:    Good and Fair  Judgment:   Good  Impulse Control:  Good and Fair   Risk Assessment: Danger to Self:  No Self-injurious Behavior: No Danger to Others: No Duty to Warn:no Physical Aggression / Violence:No  Access to Firearms a concern: No  Gang Involvement:No   Subjective: Patient in today focusing more on her anxiety which she states above "some may be self-driven", depression and worry, frustration, and anger (although not quite as strong). Noticing "I am less patient and internally I brew at times." Owns that she adds stress to certain family situations because of her difficulty managing and venting her anger. States "the only way family takes me seriously, its when I get louder and more emotional with her anger. " Processed this in more depth in session today with patient as this is a reoccurring issue, where she is needing better communication and anger/stress management skills. "Don't think I'm deserving of happy calmer life" which she explained  in more detail and with self-blaming. Worked on this more today including origins of some of her feelings about anger and her management of it, and identifies patterns she wants to be able to change "including more thought-through responses, be more considerate of whoever I'm talking with, and be able to express feelings in healthier ways, and be more respected for what I do or say."  "I want to be able to express myself and then Let It Go versus hold onto negatives and re-hash them." Processed this more in session today, as she feels very "blamed and weighted down" by family", and looked more at some further "letting go" for patient which she practiced in session today. Healthy communication is a big issue for patient and family "although other family members don't seem to be aware of the issues." Continue to assist patient in identifying ways she can communicate in healthier ways and be able to not let others "weigh" her down. To practice some behavioral changes discussed today, in between sessions.   Interventions: Cognitive Behavioral Therapy and Ego-Supportive  Long-term goal: Increase understanding of beliefs and messages that produce the worry and anxiety. Short-term goal: Reduce overall level, frequency, and intensity of the anxiety so that daily functioning is not impaired. Strategies: Patient to learn further about anxiety re: daily skills for managing it, and to use the skills effectively in everyday life.   Diagnosis:   ICD-10-CM   1. Generalized anxiety disorder  F41.1  Plan:  Patient in session and showing good motivation and participation today following up from last session on some improved communication skills and also her working to set healthier limits, "but both of these are a work in progress".  She is continuing to make progress and has shown increased motivation more noticeably within the past 2 sessions.  Needs to continue her work with goal-directed behaviors to move in a  forward direction.  Reminded and encouraged patient and practicing more positive and self affirming behaviors as noted in session including: Remain in the present focused on what she can control or change, challenge and counteract self-doubt, understand that her anxious thoughts are "just thoughts" and are not necessarily going to play out in reality the way she might fear, decrease her feelings of inadequacy within the family, interrupt and decrease her overthinking and over analyzing, refrain from assuming worst-case scenarios, positive self talk, work on intentionally setting healthier boundaries, and realize the strength she shows working with goal-directed behaviors to move in a direction that supports her improved overall emotional health and outlook.  Goal review and progress/challenges noted with patient.  Next appointment within 2 weeks.  Mathis Fare, LCSW

## 2022-10-12 ENCOUNTER — Ambulatory Visit (INDEPENDENT_AMBULATORY_CARE_PROVIDER_SITE_OTHER): Payer: Medicare Other | Admitting: Psychiatry

## 2022-10-12 DIAGNOSIS — F411 Generalized anxiety disorder: Secondary | ICD-10-CM

## 2022-10-12 NOTE — Progress Notes (Signed)
[facility] Counselor/Therapist Progress Note  Patient ID: Valerie [name], MRN: [id number],    Date: 10/12/2022  Time Spent: 55 minutes   Treatment Type: Individual Therapy  Reported Symptoms:  anxiety, depression, questioning myself and comparing myself to others, depressed, worrying, "not good enough, not worthy", feelings of inadequacy, anger/frustration with others, self-judgment and negative self-talk, less patience and understanding of others   Mental Status Exam:  Appearance:   Casual and Neat     Behavior:  Appropriate, Sharing, and Motivated  Motor:  Normal  Speech/Language:   Clear and Coherent  Affect:  Depressed and anxious, worrying  Mood:  anxious and depressed  Thought process:  goal directed  Thought content:    Rumination  Sensory/Perceptual disturbances:    WNL  Orientation:  oriented to person, place, time/date, situation, day of week, month of year, year, and stated date of October 12, 2022  Attention:  Good  Concentration:  Good  Memory:  WNL  Fund of knowledge:   Good  Insight:    Good and Fair  Judgment:   Good  Impulse Control:  Good   Risk Assessment: Danger to Self:  No Self-injurious Behavior: No Danger to Others: No Duty to Warn:no Physical Aggression / Violence:No  Access to Firearms a concern: No  Gang Involvement:No   Subjective:  Patient in session today working further on her anxiety, worrying, frustration, self-judging, "letting others overpower me". Tends to deflect issues and look at others and their issues, versus acknowledging herself and her own struggles mostly related to communication issues and feelings of inadequacy. Working more today on her low self-concept, unhealthy relationships with sister and mother, difficulty setting limits, and allowing others to mistreat her, especially within family. Did some helpful roleplay today with her anxiety, anger, and frustration. Realizing so much is out of her control within family and  extended family, which is difficult for patient and it's also difficult for her to accept this but does understand she can't change others. Does recognize how "I allow people to bait me" and processed this more today. Homework includes journaling, positive self talk, and practice of healthier boundaries.  Difficulty letting and refraining from re-hashing things immediately.  Working more intentionally on her communication to make it healthier and not letting others "overwhelm or wait me down".  Interventions: Cognitive Behavioral Therapy and Ego-Supportive  Long-term goal: Increase understanding of beliefs and messages that produce the worry and anxiety. Short-term goal: Reduce overall level, frequency, and intensity of the anxiety so that daily functioning is not impaired. Strategies: Patient to learn further about anxiety re: daily skills for managing it, and to use the skills effectively in everyday life.   Diagnosis:   ICD-10-CM   1. Generalized anxiety disorder  F41.1      Plan: Patient in session today actively participating as we picked up from last session continuing to help her work on communication skills, healthy boundaries, and reducing her overall level and intensity of anxiety so that daily functioning is less impaired.  She continues to show good motivation and less blaming of others.  She is making progress and needs to continue working with goal-directed behaviors to keep moving in a forward direction.  Reminded and encouraged patient to be practicing more positive and self affirming behaviors as noted in session including: Challenging counteract her self-doubt, paying attention to how she says what she says, stay in the present focused on what she can control or change, understand that her anxious thoughts  are "just thoughts" and are not necessarily going to play out in reality the way she might fear, decrease her feelings of inadequacy within the family, interrupting decrease her  overthinking and over analyzing, refrain from assuming worst-case scenarios, use of more positive self talk, work on intentionally setting healthier boundaries, and recognize the strengths she shows working with goal-directed behaviors to move in a direction that supports her improved overall emotional health and wellbeing.  Goal review and progress/challenges noted with patient.  Next appointment within 2 weeks.   Mathis Fare, LCSW

## 2022-10-13 ENCOUNTER — Encounter: Payer: Self-pay | Admitting: Behavioral Health

## 2022-10-13 ENCOUNTER — Telehealth: Payer: Medicare Other | Admitting: Behavioral Health

## 2022-10-13 DIAGNOSIS — F99 Mental disorder, not otherwise specified: Secondary | ICD-10-CM

## 2022-10-13 DIAGNOSIS — F411 Generalized anxiety disorder: Secondary | ICD-10-CM

## 2022-10-13 DIAGNOSIS — F5105 Insomnia due to other mental disorder: Secondary | ICD-10-CM

## 2022-10-13 DIAGNOSIS — F410 Panic disorder [episodic paroxysmal anxiety] without agoraphobia: Secondary | ICD-10-CM

## 2022-10-13 DIAGNOSIS — F331 Major depressive disorder, recurrent, moderate: Secondary | ICD-10-CM | POA: Diagnosis not present

## 2022-10-13 NOTE — Progress Notes (Signed)
Valerie [name] [id number] [birth year] 62 y.o.  Virtual Visit via Video Note  I connected with pt @ on 10/13/22 at  8:00 AM EDT by a video enabled telemedicine application and verified that I am speaking with the correct person using two identifiers.   I discussed the limitations of evaluation and management by telemedicine and the availability of in person appointments. The patient expressed understanding and agreed to proceed.  I discussed the assessment and treatment plan with the patient. The patient was provided an opportunity to ask questions and all were answered. The patient agreed with the plan and demonstrated an understanding of the instructions.   The patient was advised to call back or seek an in-person evaluation if the symptoms worsen or if the condition fails to improve as anticipated.  I provided 30 minutes of non-face-to-face time during this encounter.  The patient was located at home.  The provider was located at [facility].   Joan Flores, NP   Subjective:   Patient ID:  Valerie [name] is a 62 y.o. (DOB February 24, 1961) female.  Chief Complaint:  Chief Complaint  Patient presents with   Anxiety   Depression   Follow-up   Medication Refill   Patient Education   Stress    HPI "Valerie [name]", 62 year old female presents to this office for follow up and medication management.  Says that her medication regimen continues to be fine but acknowledges intermittent anxiety leading to panic. She is unable to identify triggers. She continues in psychotherapy. Utilizing breathing techniques. Does not want to add or changes medication right now other than sleep med.  Still complaining of sleeping problems. Seroquel increase helped but not enough. She is requesting additional help.  She continues to see Rockne Menghini for therapy.  She says that her anxiety today is 2of 10, and her depression is 2 of 10.  She is sleeping 7-8 hours every night with the aid of Restoril. Sleep is  broken, wakes up often. She denies any history of mania, no psychosis, no history of auditory or visual hallucinations.  She denies SI or HI.   Prior psychiatric medication trial: Prozac Xanax Temazepam   Review of Systems:  Review of Systems  Constitutional: Negative.   Neurological: Negative.   Psychiatric/Behavioral:  Positive for dysphoric mood. The patient is nervous/anxious.     Medications: I have reviewed the patient's current medications.  Current Outpatient Medications  Medication Sig Dispense Refill   ALPRAZolam (XANAX) 0.5 MG tablet Take 1 tablet (0.5 mg total) by mouth 2 (two) times daily as needed. 180 tablet 0   docusate sodium (COLACE) 100 MG capsule Take 100 mg by mouth daily.     FLUoxetine (PROZAC) 20 MG capsule Take 4 capsules (80 mg total) by mouth daily. 120 capsule 3   folic acid (FOLVITE) 1 MG tablet Take 1 mg by mouth daily.     levothyroxine (SYNTHROID) 75 MCG tablet Take 75 mcg by mouth daily before breakfast.     LINZESS 290 MCG CAPS capsule Take 290 mcg by mouth every morning.     mirtazapine (REMERON) 7.5 MG tablet Take 1 tablet (7.5 mg total) by mouth at bedtime. 30 tablet 1   omeprazole (PRILOSEC) 40 MG capsule Take 40 mg by mouth daily as needed (heartburn).     potassium chloride SA (KLOR-CON M) 20 MEQ tablet Take 2 tablets (40 mEq total) by mouth daily for 3 days. 6 tablet 0   QUEtiapine (SEROQUEL) 100 MG tablet Take 1 tablet (100 mg  total) by mouth at bedtime. 30 tablet 3   QUEtiapine (SEROQUEL) 25 MG tablet TAKE 2 TABLETS BY MOUTH AT BEDTIME 180 tablet 0   senna (SENOKOT) 8.6 MG TABS tablet Take 4 tablets by mouth in the morning.     temazepam (RESTORIL) 30 MG capsule Take 1 capsule (30 mg total) by mouth at bedtime. 90 capsule 0   No current facility-administered medications for this visit.    Medication Side Effects: None  Allergies:  Allergies  Allergen Reactions   Oxycodone-Acetaminophen Itching   Penicillins Hives    Did it involve  swelling of the face/tongue/throat, SOB, or low BP? No Did it involve sudden or severe rash/hives, skin peeling, or any reaction on the inside of your mouth or nose? Yes Did you need to seek medical attention at a hospital or doctor's office? No When did it last happen?      Over 30 Years Ago If all above answers are "NO", may proceed with cephalosporin use.      Past Medical History:  Diagnosis Date   Small bowel obstruction (HCC)     Family History  Problem Relation Age of Onset   Breast cancer Neg Hx     Social History   Socioeconomic History   Marital status: Married    Spouse name: Not on file   Number of children: Not on file   Years of education: Not on file   Highest education level: Not on file  Occupational History   Not on file  Tobacco Use   Smoking status: Never   Smokeless tobacco: Never   Tobacco comments:    Non-smoker  Substance and Sexual Activity   Alcohol use: Never   Drug use: Never   Sexual activity: Yes  Other Topics Concern   Not on file  Social History Narrative   Not on file   Social Determinants of Health   Financial Resource Strain: Not on file  Food Insecurity: No Food Insecurity (04/23/2022)   Hunger Vital Sign    Worried About Running Out of Food in the Last Year: Never true    Ran Out of Food in the Last Year: Never true  Transportation Needs: No Transportation Needs (04/22/2022)   PRAPARE - Administrator, Civil Service (Medical): No    Lack of Transportation (Non-Medical): No  Physical Activity: Not on file  Stress: Not on file  Social Connections: Not on file  Intimate Partner Violence: Not At Risk (04/23/2022)   Humiliation, Afraid, Rape, and Kick questionnaire    Fear of Current or Ex-Partner: No    Emotionally Abused: No    Physically Abused: No    Sexually Abused: No    Past Medical History, Surgical history, Social history, and Family history were reviewed and updated as appropriate.   Please see review  of systems for further details on the patient's review from today.   Objective:   Physical Exam:  There were no vitals taken for this visit.  Physical Exam  Lab Review:     Component Value Date/Time   NA 138 04/24/2022 0503   K 3.2 (L) 04/24/2022 0503   CL 110 04/24/2022 0503   CO2 22 04/24/2022 0503   GLUCOSE 90 04/24/2022 0503   BUN 13 04/24/2022 0503   CREATININE 0.55 04/24/2022 0503   CALCIUM 8.1 (L) 04/24/2022 0503   PROT 5.5 (L) 04/23/2022 0621   ALBUMIN 2.9 (L) 04/23/2022 0621   AST 36 04/23/2022 0621   ALT 39  04/23/2022 0621   ALKPHOS 92 04/23/2022 0621   BILITOT 0.4 04/23/2022 0621   GFRNONAA >60 04/24/2022 0503       Component Value Date/Time   WBC 7.1 04/22/2022 0152   RBC 3.49 (L) 04/22/2022 0152   HGB 10.7 (L) 04/22/2022 0152   HCT 31.9 (L) 04/22/2022 0152   PLT 167 04/22/2022 0152   MCV 91.4 04/22/2022 0152   MCH 30.7 04/22/2022 0152   MCHC 33.5 04/22/2022 0152   RDW 13.5 04/22/2022 0152   LYMPHSABS 0.7 04/21/2022 1940   MONOABS 1.4 (H) 04/21/2022 1940   EOSABS 0.1 04/21/2022 1940   BASOSABS 0.0 04/21/2022 1940    No results found for: "POCLITH", "LITHIUM"   No results found for: "PHENYTOIN", "PHENOBARB", "VALPROATE", "CBMZ"   .res Assessment: Plan:    Greater than 50% of  30 min face to face time with patient was spent on counseling and coordination of care. We talked about her current stability. She is still doing well but not sleeping well at night. Still participating in psychotherapy. She has a lot of reservation about adjusting medication and does not want to this visit.  She feels like she is making progress but still having panic attacks.  She wants to learn how to avoid without increasing her medication except for sleep med. Still struggling with primary insomnia.  We agreed to; Will continue Xanax 0.5 mg twice daily as needed for severe anxiety To continue Prozac 80 mg daily.  Patient is taking four 20 mg tablets To continue Seroquel  100  mg at bedtime.  To continue temazepam 30 mg capsule at bedtime for sleep. Will increase  Mirtazapine to 15 mg at bedtime. If it does not work after 5 days, may increase to 30 mg.  Pt denies dx of prolonged QT interval. I discussed with her and she is not aware. To discuss with PCP next visit. We will report side effects or worsening symptoms promptly Provided emergency contact information We will follow-up in 3 months as required to continue medications. Discussed potential benefits, risk, and side effects of benzodiazepines to include potential risk of tolerance and dependence, as well as possible drowsiness.  Advised patient not to drive if experiencing drowsiness and to take lowest possible effective dose to minimize risk of dependence and tolerance.  Discussed potential metabolic side effects associated with atypical antipsychotics, as well as potential risk for movement side effects. Advised pt to contact office if movement side effects occur.   Reviewed PDMP      [name]. Ruba Outen     There are no diagnoses linked to this encounter.   Please see After Visit Summary for patient specific instructions.  Future Appointments  Date Time Provider Department Center  10/26/2022  8:00 AM Mathis Fare, LCSW [facility] None  11/09/2022  8:00 AM Mathis Fare, LCSW [facility] None  11/23/2022  8:00 AM Mathis Fare, LCSW [facility] None  12/07/2022  8:00 AM Mathis Fare, LCSW [facility] None  02/18/2023  7:00 AM [facility] MM 2 [facility]  02/18/2023  7:30 AM [facility] DX DEXA 1 [facility]    No orders of the defined types were placed in this encounter.     -------------------------------

## 2022-10-15 ENCOUNTER — Telehealth: Payer: Self-pay | Admitting: Behavioral Health

## 2022-10-15 MED ORDER — MIRTAZAPINE 15 MG PO TABS: 15.0000 mg | ORAL_TABLET | Freq: Every day | ORAL | 0 refills | Status: DC

## 2022-10-15 NOTE — Telephone Encounter (Signed)
Pt called and said on Tuesday brian told the pt that she could increase the mirtazapine to 2 tablets a day. Please cancel refill and send in a new script with increase dose to [facility] on [address]

## 2022-10-15 NOTE — Telephone Encounter (Signed)
Rx sent for 15 mg and patient's husband notified.

## 2022-10-26 ENCOUNTER — Ambulatory Visit (INDEPENDENT_AMBULATORY_CARE_PROVIDER_SITE_OTHER): Payer: Medicare Other | Admitting: Psychiatry

## 2022-10-26 DIAGNOSIS — F411 Generalized anxiety disorder: Secondary | ICD-10-CM | POA: Diagnosis not present

## 2022-10-26 NOTE — Progress Notes (Signed)
[facility] Counselor/Therapist Progress Note  Patient ID: Valerie [name], MRN: [id number],    Date: 10/26/2022  Time Spent: 55 minutes   Treatment Type: Individual Therapy  Reported Symptoms: anxiety, depression "but anxiety is stronger symptom", worries, feels inadequate and "not good enough", compares self to others, frustration, anger, self-judgment, negative self talk , impatient with others, motivation some improvement  Mental Status Exam:  Appearance:   Casual     Behavior:  Appropriate and Sharing  Motor:  Normal  Speech/Language:   Clear and Coherent  Affect:  Depressed and anxious  Mood:  anxious  Thought process:  goal directed  Thought content:    WNL  Sensory/Perceptual disturbances:    WNL  Orientation:  oriented to person, place, time/date, situation, day of week, month of year, year, and stated date of October 26, 2022  Attention:  Good  Concentration:  Good  Memory:  WNL  Fund of knowledge:   Good  Insight:    Good and Fair  Judgment:   Good  Impulse Control:  Good   Risk Assessment: Danger to Self:  No Self-injurious Behavior: No Danger to Others: No Duty to Warn:no Physical Aggression / Violence:No  Access to Firearms a concern: No  Gang Involvement:No   Subjective:  Patient in for session today reporting anxiety, still having some self judging, worrying, letting others overpower her, and difficulty taking breaks when needed especially within family. Some progress noted in boundary setting with family and they are "not really happy with me setting boundaries". Verbal communication with adult son is improving "with boundaries", "but not necessarily respected". Patient feeling some better within family conflictual relationships but still experiencing frequent challenges in marital/family relationships. Worked more specifically today on not getting caught up in verbal struggles within family involving insults and criticalness from others. Specific verbal  and behavioral skills addressed, along with "not looking for what might go wrong" and instead be practicing improved interpersonal and verbal skills especially within the family. Continues work on unhealthy relationships, low self-esteem, and difficulty setting limits. Continued use of role-play in session today working more on direct communication, managing anxiety and anger. Trying not to allow family members to "bait me". Hard to not re-hash the past. Difficulty "letting go". To continues homework exercises re: boundaries, between sessions.   Interventions: Cognitive Behavioral Therapy, Solution-Oriented/Positive Psychology, and Ego-Supportive  Long-term goal: Increase understanding of beliefs and messages that produce the worry and anxiety. Short-term goal: Reduce overall level, frequency, and intensity of the anxiety so that daily functioning is not impaired. Strategies: Patient to learn further about anxiety re: daily skills for managing it, and to use the skills effectively in everyday life.   Diagnosis:   ICD-10-CM   1. Generalized anxiety disorder  F41.1      Plan: Patient in for her session today showing good participation and motivation working on her anxiety, depression, and improved self-care and communication skills within the family. Motivated and is making progress.  Trying to be less blaming and focus on her behaviors and areas of life that he is working to change.  Needs to continue working with goal-directed behaviors to move in positive and forward direction. Encouraging to continue journaling between sessions. Reminded and encouraged patient to be practicing more positive and self affirming behaviors as noted in session including: Challenge and counteract her self-doubt, paying attention to how she says what she says, stay in the present focused on what she can control or change, understand that her  anxious thoughts are "just thoughts" and are not necessarily going to play out in  reality the way she might fear, decrease her feelings of inadequacy within the family, interrupt and decrease her overthinking and over analyzing, refrain from assuming worst-case scenarios, use of more positive self talk, work on intentionally setting healthier boundaries, being able to walk away from situations when needed, and recognize the strengths she shows working with goal-directed behaviors to move in a direction that supports her improved overall emotional health and wellbeing.  Goal review and progress/challenges noted with patient.  Next appt within 1 to 2 weeks.   Mathis Fare, LCSW

## 2022-11-09 ENCOUNTER — Ambulatory Visit (INDEPENDENT_AMBULATORY_CARE_PROVIDER_SITE_OTHER): Payer: Medicare Other | Admitting: Psychiatry

## 2022-11-09 DIAGNOSIS — F411 Generalized anxiety disorder: Secondary | ICD-10-CM

## 2022-11-09 NOTE — Progress Notes (Signed)
[facility] Counselor/Therapist Progress Note  Patient ID: Valerie [name], MRN: [id number],    Date: 11/09/2022  Time Spent: 55 minutes   Treatment Type: Individual Therapy  Reported Symptoms: anxiety, depression, "hide my worries", feelings of inadequacy and not being good enough, frustration, self-judgment, anger, negative self-talk and self-judgment, impatient with others, "but there is some improvement and I'm trying to set boundaries better"  Mental Status Exam:  Appearance:   Casual     Behavior:  Appropriate, Sharing, and Motivated  Motor:  Normal  Speech/Language:   Clear and Coherent  Affect:  Anxious, depressed  Mood:  anxious and depressed  Thought process:  goal directed  Thought content:    Some obsessive thoughts  Sensory/Perceptual disturbances:    WNL  Orientation:  oriented to person, place, time/date, situation, day of week, month of year, year, and stated date of Sept. 9, 2024  Attention:  Good  Concentration:  Fair  Memory:  WNL  Fund of knowledge:   Good  Insight:    Fair  Judgment:   Good and Fair  Impulse Control:  Fair   Risk Assessment: Danger to Self:  No Self-injurious Behavior: No Danger to Others: No Duty to Warn:no Physical Aggression / Violence:No  Access to Firearms a concern: No  Gang Involvement:No   Subjective: Patient today continues working on her anxiety, communication within family which tends to be negative and judgmental, patient's "harsh criticism at times", self-judging re: weight (due to prior history of being overweight), despite her not being overweight she is convinced she is and has used husband's medication for weight loss "which he isn't using." Discussed this with patient and she agreed that she will stop and not use that medication any more without checking with her doctor. Worked today on her obsessive thoughts and feelings about herself including her thoughts and behaviors around self-care, eating healthier, and  feeling more confident about herself. Also worked further on her worrying and self-judging, and looking more at the negative versus positives. Boundary issues discussed mostly within family. Trying to limit her involvement in verbal struggles looking for what might go wrong versus within family relationships. "Letting go" is strong issue for patient. Encouraged behaviors and self talk that support improved self-esteem, better choices, healthier thinking patterns. Use of role-play in sessions re: family communication and boundaries seems helpful to patient in trying to set healthier limits and improve her self-esteem some. Homework to continue in between now and next session.   Interventions: Cognitive Behavioral Therapy and Ego-Supportive  Long-term goal: Increase understanding of beliefs and messages that produce the worry and anxiety. Short-term goal: Reduce overall level, frequency, and intensity of the anxiety so that daily functioning is not impaired. Strategies: Patient to learn further about anxiety re: daily skills for managing it, and to use the skills effectively in everyday life.  Diagnosis:   ICD-10-CM   1. Generalized anxiety disorder  F41.1      Plan:  Patient in session today motivated as she continues working on her anxiety, family communication skills, self criticalness and self judging.  She has made progress and is becoming more and more open and working in sessions.  Does need to continue working with goal-directed behaviors to move and a healthier and more positive direction. Encouraged patient to continue journaling between sessions.  Reminded and encouraged her to be practicing more positive and self affirming behaviors as noted and practiced in sessions including: Challenge and counteract self-doubt, paying attention to how she  says what she says, stay in the present focused on what she can control or change, understand that her anxious thoughts are "just thoughts" and are not  necessarily going to play out in reality the way she might fear, decrease her feelings of inadequacy within the family, interrupt and decrease her overthinking and over analyzing, refrain from assuming worst-case scenarios, positive self talk, work on intentionally setting healthier boundaries, being able to walk away from volatile situations when needed, and realize the strengths she shows working with goal-directed behaviors to move in a direction that supports her improved overall emotional health and outlook into the future.  Goal review and progress/challenges noted with patient.  Next appt within 1-2 weeks.   Mathis Fare, LCSW

## 2022-11-20 DIAGNOSIS — R3129 Other microscopic hematuria: Secondary | ICD-10-CM | POA: Diagnosis not present

## 2022-11-20 DIAGNOSIS — R8290 Unspecified abnormal findings in urine: Secondary | ICD-10-CM | POA: Diagnosis not present

## 2022-11-20 DIAGNOSIS — N3090 Cystitis, unspecified without hematuria: Secondary | ICD-10-CM | POA: Diagnosis not present

## 2022-11-20 DIAGNOSIS — M79621 Pain in right upper arm: Secondary | ICD-10-CM | POA: Diagnosis not present

## 2022-11-23 ENCOUNTER — Ambulatory Visit: Payer: Medicare Other | Admitting: Psychiatry

## 2022-11-23 DIAGNOSIS — F411 Generalized anxiety disorder: Secondary | ICD-10-CM | POA: Diagnosis not present

## 2022-11-23 NOTE — Progress Notes (Signed)
[facility] Counselor/Therapist Progress Note  Patient ID: Valerie [name], MRN: [id number],    Date: 11/23/2022  Time Spent: 58 minutes   Treatment Type: Individual Therapy  Reported Symptoms: anxiety, "wavering on how I stand on issues which is better than always going in negative direction", not hiding her "worries" as much, noticing some increase in ambition, some procrastination, feelings of inadequacy, short on patience with herself, trying to navigate difficult relationships with sisters which negatively impacts patient's relationship with aging mother, some improvement in negative self-talk, continued work on setting healthier boundaries   Mental Status Exam:  Appearance:   Casual     Behavior:  Appropriate, Sharing, and Motivated  Motor:  Normal  Speech/Language:   Clear and Coherent  Affect:  anxious  Mood:  anxious  Thought process:  goal directed  Thought content:    WNL  Sensory/Perceptual disturbances:    WNL  Orientation:  oriented to person, place, time/date, situation, day of week, month of year, year, and stated date of Sept. 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 States anxiety is stronger more recently and depression has not decreased "but not increased which is good."  Had previously used husband's medication for weight loss and discussed this with her on her, as this was  strongly discouraged in her last appointment.  Worked more today on her self perceptions and the inaccuracy involved as she tends to judge herself. Is realizing more of how she makes lots of assumptions, some of which are definitely inaccurate, and working on changing some of those assumptions and ways of believing that are holding patient back. Focusing more on letting go of old issues and not hanging onto to them, better self-care, healthier eating pattern and learning to set healthier limits and feel better about herself. Further work on boundary issues within family and trying to "let go". Continues to work on her worrying. Patient recognizing more of her need to not overly focus on the negatives. Focused also on her ability to remain calm in stressful family interactions, and paying attention to her communication especially under stress or when angry/hurt. Continue to encourage patient in her more positive self-talk and behaviors and looking for better resolution when hurt or angered especially within family situations.  Continue to reflect on some of the role-play used in today's session about appropriate boundaries and communication within the family.  Interventions: Cognitive Behavioral Therapy and Ego-Supportive  Long-term goal: Increase understanding of beliefs and messages that produce the worry and anxiety. Short-term goal: Reduce overall level, frequency, and intensity of the anxiety so that daily functioning is not impaired. Strategies: Patient to learn further about anxiety re: daily skills for managing it, and to use the skills effectively in everyday life.  Diagnosis:   ICD-10-CM   1. Generalized anxiety disorder  F41.1      Plan:  Patient in  session today showing good motivation and continuing to work on her communication skills within the family, self judging and self criticalness, and anxiety.  Patient is making progress and needs to continue working with goal-directed behaviors  to move in a positive direction.  She continues to use journaling sometimes between sessions which can be helpful. Reviewed and encouraged patient to be practicing more positive and self affirming behaviors as noted and practiced in sessions previously including: Challenge and counteract self-doubt, paying attention to how she says what she says, stay in the present focused on what she can control or change, understand that her anxious thoughts are "just thoughts" and are not necessarily going to play out in reality the way she might fear, decrease her feelings of inadequacy within the family, interrupt and decrease her overthinking and over analyzing, refrain from assuming worst-case scenarios, positive self talk, work on intentionally setting healthier boundaries, be able to walk away from volatile situations when needed, and recognize the strength she shows working with goal-directed behaviors to move in a direction that supports her improved overall emotional health and wellbeing.  Goal review and progress/challenges noted with patient.  Next appointment within 1 to 2 weeks.   Mathis Fare, LCSW

## 2022-12-01 DIAGNOSIS — F4322 Adjustment disorder with anxiety: Secondary | ICD-10-CM | POA: Diagnosis not present

## 2022-12-07 ENCOUNTER — Ambulatory Visit: Payer: Medicare Other | Admitting: Psychiatry

## 2022-12-07 ENCOUNTER — Other Ambulatory Visit: Payer: Self-pay | Admitting: Behavioral Health

## 2022-12-07 DIAGNOSIS — F411 Generalized anxiety disorder: Secondary | ICD-10-CM | POA: Diagnosis not present

## 2022-12-07 NOTE — Progress Notes (Signed)
[facility] Counselor/Therapist Progress Note  Patient ID: Valerie [name], MRN: [id number],    Date: 12/07/2022  Time Spent: 55 minutes   Treatment Type: Individual Therapy  Reported Symptoms: anxiety, some self-negating, procrastination, feelings of inadequacy, impatient, and decrease in negative self-talk   Mental Status Exam:  Appearance:   Casual     Behavior:  Appropriate, Sharing, and some motivational challenges  Motor:  Normal  Speech/Language:   Clear and Coherent  Affect:  anxious  Mood:  anxious  Thought process:  goal directed  Thought content:    Rumination  Sensory/Perceptual disturbances:    WNL  Orientation:  oriented to person, place, time/date, situation, day of week, month of year, year, and stated date of Oct. 7, 2024  Attention:  Good  Concentration:  Good  Memory:  WNL  Fund of knowledge:   Good  Insight:    Good and Fair  Judgment:   Fair  Impulse Control:  Good   Risk Assessment: Danger to Self:  No Self-injurious Behavior: No Danger to Others: No Duty to Warn:no Physical Aggression / Violence:No  Access to Firearms a concern: No  Gang Involvement:No   Subjective: Patient today in session and working further on her communication skills especially within the family and some less judging of people and needs to work on being less judgmental of herself, which she did in session today. Recognized her self-criticalness and not feeling she is good enough at anything. Shares how she will make some progress but is hard to hold onto it. Describes some self-sabotage in not making some of her wanted changes and perfectionism gets in the way. Worked on this in session today using specific examples. Also working on self-doubt, and questioning of herself. Continues to recognize at times, her self-negating, "not feeling as good as others." Increased communication with husband that has gone "some better". Continues to work on these issues in her marital  relationship and her own personal issues of self-sabotage, feeling inadequate, and seeing herself in negative ways. Husband more receptive and also recognizing some of his issues that contribute to the marital friction and concerns. Patient being more communicative in and outside of sessions. Reminded of healthier eating, healthier limit setting, appropriate boundaries, and "letting go of thoughts that hold me back as I'm trying to make positive changes." Issues with son and wife expecting 2nd baby within couple months.  Interventions: Cognitive Behavioral Therapy and Ego-Supportive  Long-term goal: Increase understanding of beliefs and messages that produce the worry and anxiety. Short-term goal: Reduce overall level, frequency, and intensity of the anxiety so that daily functioning is not impaired. Strategies: Patient to learn further about anxiety re: daily skills for managing it, and to use the skills effectively in everyday life.  Diagnosis:   ICD-10-CM   1. Generalized anxiety disorder  F41.1      Plan:  Patient here for session today and continued working on her self-esteem issues and "not feeling as good as others", communication skills within the family, her tendency to self sabotage, feel an adequate, view herself in negative ways.  Patient is making progress and needs to continue working with goal-directed behaviors in order to keep moving in a forward direction.  Reviewed and encouraged patient to practice more often the positive and self affirming behaviors as noted and practiced in sessions including: Challenge and counteract her self-doubt, pay attention to how she says what she says, stay in the present focused on what she can control or  change, understand that her anxious thoughts are "just thoughts" and are not necessarily going to play out in reality the way she might fear, decrease her feelings of inadequacy within the family, interrupt and decrease her overthinking and over  analyzing, refrain from assuming worst-case scenarios, positive self talk, work more intentionally on setting healthier boundaries, be able to walk away from volatile situations when needed, and realize the strength she shows working with goal-directed behaviors to move in a direction that supports her improved overall emotional health and wellbeing.  Goal review and progress/challenges noted with patient.  Next appt within 1-2 weeks.  Mathis Fare, LCSW

## 2022-12-08 DIAGNOSIS — F4322 Adjustment disorder with anxiety: Secondary | ICD-10-CM | POA: Diagnosis not present

## 2022-12-10 DIAGNOSIS — R3129 Other microscopic hematuria: Secondary | ICD-10-CM | POA: Diagnosis not present

## 2022-12-13 ENCOUNTER — Other Ambulatory Visit: Payer: Self-pay | Admitting: Behavioral Health

## 2022-12-13 DIAGNOSIS — F5105 Insomnia due to other mental disorder: Secondary | ICD-10-CM

## 2022-12-14 ENCOUNTER — Ambulatory Visit: Payer: Medicare Other | Admitting: Behavioral Health

## 2022-12-14 ENCOUNTER — Encounter: Payer: Self-pay | Admitting: Behavioral Health

## 2022-12-14 DIAGNOSIS — F411 Generalized anxiety disorder: Secondary | ICD-10-CM

## 2022-12-14 DIAGNOSIS — F5105 Insomnia due to other mental disorder: Secondary | ICD-10-CM | POA: Diagnosis not present

## 2022-12-14 DIAGNOSIS — F99 Mental disorder, not otherwise specified: Secondary | ICD-10-CM

## 2022-12-14 MED ORDER — TEMAZEPAM 30 MG PO CAPS: 30.0000 mg | ORAL_CAPSULE | Freq: Every day | ORAL | 1 refills | Status: DC

## 2022-12-14 MED ORDER — QUETIAPINE FUMARATE 100 MG PO TABS: 100.0000 mg | ORAL_TABLET | Freq: Every day | ORAL | 3 refills | Status: DC

## 2022-12-14 NOTE — Progress Notes (Signed)
[facility] Med Check  Patient ID: Valerie [name],  MRN: [id number]  PCP: [name], MD  Date of Evaluation: 12/14/2022 Time spent:30 minutes  Chief Complaint:  Chief Complaint   Depression; Anxiety; Follow-up; Medication Refill; Patient Education     HISTORY/CURRENT STATUS: HPI "Valerie [name]", 62 year old female presents to this office for follow up and medication management.  Says that her medication regimen continues to be fine but acknowledges intermittent anxiety leading to panic. She is unable to identify triggers. She continues in psychotherapy. She is leaving for [place] today to help family. She has an extreme amount of anxiety of it. She is still having a hard time sleeping.  She is requesting additional help.  She continues to see Rockne Menghini for therapy.  She says that her anxiety today is 6 of 10, and her depression is 2 of 10.  She is sleeping 7-8 hours every night with the aid of Restoril. Sleep is broken, wakes up often. She denies any history of mania, no psychosis, no history of auditory or visual hallucinations.  She denies SI or HI.   Prior psychiatric medication trial: Prozac Xanax Temazepam Ambien Mirtazapine Trazodone Seroquel   Individual Medical History/ Review of Systems: Changes? :No   Allergies: Oxycodone-acetaminophen and Penicillins  Current Medications:  Current Outpatient Medications:    ALPRAZolam (XANAX) 0.5 MG tablet, Take 1 tablet by mouth twice daily as needed, Disp: 180 tablet, Rfl: 0   docusate sodium (COLACE) 100 MG capsule, Take 100 mg by mouth daily., Disp: , Rfl:    FLUoxetine (PROZAC) 20 MG capsule, Take 4 capsules (80 mg total) by mouth daily., Disp: 120 capsule, Rfl: 3   folic acid (FOLVITE) 1 MG tablet, Take 1 mg by mouth daily., Disp: , Rfl:    levothyroxine (SYNTHROID) 75 MCG tablet, Take 75 mcg by mouth daily before breakfast., Disp: , Rfl:    LINZESS 290 MCG CAPS capsule, Take 290 mcg by mouth every morning., Disp: , Rfl:     omeprazole (PRILOSEC) 40 MG capsule, Take 40 mg by mouth daily as needed (heartburn)., Disp: , Rfl:    potassium chloride SA (KLOR-CON M) 20 MEQ tablet, Take 2 tablets (40 mEq total) by mouth daily for 3 days., Disp: 6 tablet, Rfl: 0   QUEtiapine (SEROQUEL) 100 MG tablet, Take 1 tablet (100 mg total) by mouth at bedtime., Disp: 30 tablet, Rfl: 3   QUEtiapine (SEROQUEL) 25 MG tablet, TAKE 2 TABLETS BY MOUTH AT BEDTIME, Disp: 180 tablet, Rfl: 0   senna (SENOKOT) 8.6 MG TABS tablet, Take 4 tablets by mouth in the morning., Disp: , Rfl:    temazepam (RESTORIL) 30 MG capsule, Take 1 capsule (30 mg total) by mouth at bedtime., Disp: 90 capsule, Rfl: 0 Medication Side Effects: none  Family Medical/ Social History: Changes? No  MENTAL HEALTH EXAM:  There were no vitals taken for this visit.There is no height or weight on file to calculate BMI.  General Appearance: Casual, Neat, and Well Groomed  Eye Contact:  Good  Speech:  Clear and Coherent  Volume:  Normal  Mood:  Anxious  Affect:  Appropriate and Anxious  Thought Process:  Coherent  Orientation:  Full (Time, Place, and Person)  Thought Content: Logical   Suicidal Thoughts:  No  Homicidal Thoughts:  No  Memory:  WNL  Judgement:  Good  Insight:  Good  Psychomotor Activity:  Normal  Concentration:  Concentration: Good  Recall:  Good  Fund of Knowledge: Good  Language: Good  Assets:  Desire for Improvement  ADL's:  Intact  Cognition: WNL  Prognosis:  Good    DIAGNOSES:    ICD-10-CM   1. Insomnia due to other mental disorder  F51.05    F99     2. Generalized anxiety disorder  F41.1       Receiving Psychotherapy: Yes    RECOMMENDATIONS:    Greater than 50% of  30 min face to face time with patient was spent on counseling and coordination of care. We talked about her current stability. She is still doing well but not sleeping well at night. Still participating in psychotherapy. Still dealing with family stressors. We talked  about her trip to [place] to help with her mother and son.  She does not want to go.   We agreed to; Will continue Xanax 0.5 mg twice daily as needed for severe anxiety To continue Prozac 80 mg daily.  Patient is taking four 20 mg tablets To continue Seroquel 100  mg at bedtime.  To continue temazepam 30 mg capsule at bedtime for sleep. Stopped  Mirtazapine to 15 mg at bedtime.  Pt denies dx of prolonged QT interval. I discussed with her and she is not aware. To discuss with PCP next visit. We will report side effects or worsening symptoms promptly Provided emergency contact information We will follow-up in 3 months as required to continue medications. Discussed potential benefits, risk, and side effects of benzodiazepines to include potential risk of tolerance and dependence, as well as possible drowsiness.  Advised patient not to drive if experiencing drowsiness and to take lowest possible effective dose to minimize risk of dependence and tolerance.  Discussed potential metabolic side effects associated with atypical antipsychotics, as well as potential risk for movement side effects. Advised pt to contact office if movement side effects occur.   Reviewed PDMP      [name]. Valborg Friar    Joan Flores, NP

## 2022-12-21 ENCOUNTER — Ambulatory Visit (INDEPENDENT_AMBULATORY_CARE_PROVIDER_SITE_OTHER): Payer: Medicare Other | Admitting: Psychiatry

## 2023-01-04 ENCOUNTER — Ambulatory Visit: Payer: Medicare Other | Admitting: Psychiatry

## 2023-01-06 DIAGNOSIS — J019 Acute sinusitis, unspecified: Secondary | ICD-10-CM | POA: Diagnosis not present

## 2023-01-14 ENCOUNTER — Ambulatory Visit: Payer: Medicare Other | Admitting: Psychiatry

## 2023-01-14 DIAGNOSIS — F331 Major depressive disorder, recurrent, moderate: Secondary | ICD-10-CM

## 2023-01-14 NOTE — Progress Notes (Signed)
[facility] Counselor/Therapist Progress Note  Patient ID: Valerie [name], MRN: [id number],    Date: 01/14/2023  Time Spent: 53 minutes   Treatment Type: Individual Therapy  Reported Symptoms:  depression, anxiety, anger toward sisters and mother   Mental Status Exam:  Appearance:   Casual     Behavior:  Appropriate, Sharing, and Motivated  Motor:  Normal  Speech/Language:   Clear and Coherent  Affect:  Depressed and anxiety, angry  Mood:  angry, anxious, and depressed  Thought process:  goal directed  Thought content:    Rumination  Sensory/Perceptual disturbances:    WNL  Orientation:  oriented to person, place, time/date, situation, day of week, month of year, year, and stated date of Nov.14, 2024  Attention:  Fair  Concentration:  Fair  Memory:  WNL  Fund of knowledge:   Good  Insight:    Good and Fair  Judgment:   Good and Fair  Impulse Control:  Good   Risk Assessment: Danger to Self:  No Self-injurious Behavior: No Danger to Others: No Duty to Warn:no Physical Aggression / Violence:No  Access to Firearms a concern: No  Gang Involvement:No   Subjective: Patient today very angry after recent death of mother. Also feeling anxious, depressed especially towards 2 sisters and deceased mother who died 3 weeks ago at age 64. Today needed session to share further and process family conflicts and the recent death of her mother out of state. Denies any SI. Lots of conflicted feelings within family, particularly between patient and her 2 sisters, which patient needed to work with in session today in efforts for her not to let ill feelings interrupt her grieving her mom's death. Hurt by some of the decisions made GE:XBMWU left out of decisions for her mother. Still some self-sabotage. Self-doubt increased after recent events within family. Some self-negating and not feeling as good as others. Husband supportive, recognizing some of his own issues and making some positive  changes. Communication better with husband. Continues work on better boundaries with others.   Interventions: Cognitive Behavioral Therapy, Ego-Supportive, and Grief Therapy  Long-term goal: Increase understanding of beliefs and messages that produce the worry and anxiety. Short-term goal: Reduce overall level, frequency, and intensity of the anxiety so that daily functioning is not impaired. Strategies: Patient to learn further about anxiety re: daily skills for managing it, and to use the skills effectively in everyday life.   Diagnosis:   ICD-10-CM   1. Major depressive disorder, recurrent episode, moderate (HCC)  F33.1      Plan: Patient today in session and working on some heightened anxiety and anger within the family and particularly after the death of her mother 3 weeks ago.  Was able to work through some significant issues and will continue to do so in upcoming sessions.  She was calmer and more settled by end of session and stated that this was the first time she has been able to really talk openly about it with someone where she can trust.  Also continues working on self-esteem issues and not feeling as good as other people, her tendency to self sabotage, feelings of inadequacy, viewing herself in negative ways, and communication skills particular within the family.  She has made some progress and needs to continue working with her goal-directed behaviors in order to keep moving in a forward direction.  Reminded and encouraged patient to practice more often the positive and self affirming behaviors as noted and practiced in sessions including:  Challenge and counteract her self-doubt, pay attention to how she says what she says, remain in the present and focused on what she can control or change, understand that her anxious thoughts are "just thoughts" and are not necessarily going to play out in reality the way she might fear, decrease her feelings of inadequacy within the family,  interrupt and decrease her overthinking and over analyzing, refrain from assuming worst-case scenarios, use of positive self talk, work more intentionally on setting healthier boundaries, be able to walk away from volatile situations when needed, and recognize the strength she has within herself to be able to work in a more focused manner on her goal-directed behaviors and move in a direction that supports her improved overall emotional health and her outlook into the future.  Goal review and progress/challenges noted with patient.  Next appointment within 1 to 2 weeks.   Mathis Fare, LCSW

## 2023-02-03 ENCOUNTER — Ambulatory Visit: Payer: Medicare Other | Admitting: Psychiatry

## 2023-02-03 DIAGNOSIS — F411 Generalized anxiety disorder: Secondary | ICD-10-CM

## 2023-02-03 NOTE — Progress Notes (Signed)
[facility] Counselor/Therapist Progress Note  Patient ID: Valerie [name], MRN: [id number],    Date: 02/03/2023  Time Spent: 55 minutes   Treatment Type: Individual Therapy  Reported Symptoms: anxiety, depressed, Grief, anger "quite strong"   Mental Status Exam:  Appearance:   Casual and Neat     Behavior:  Appropriate, Sharing, and Motivated  Motor:  Normal  Speech/Language:   Clear and Coherent  Affect:  Depressed and anxious, angry  Mood:  angry, anxious, and depressed  Thought process:  goal directed  Thought content:    Rumination  Sensory/Perceptual disturbances:    WNL  Orientation:  oriented to person, place, time/date, situation, day of week, month of year, year, and stated date of Dec. 4, 2024  Attention:  Good  Concentration:  Good and Fair  Memory:  WNL  Fund of knowledge:   Good  Insight:    Good and Fair  Judgment:   Good and Fair  Impulse Control:  Good and Fair   Risk Assessment: Danger to Self:  No Self-injurious Behavior: No Danger to Others: No Duty to Warn:no Physical Aggression / Violence:No  Access to Firearms a concern: No  Gang Involvement:No   Subjective:   Patient in session today working further on her grief over mom's recent death, the anger and frustration of issues within her family especially with her 2 sisters, patient's mixed feelings of grief and anger, and trying to "let go in some ways" in order to eventually move forward. Feeling and processing some long-time bitterness with 2 other sisters, which is very difficulty for patient to let go of, although she does see the danger in holding onto all of the negativity. Anger issues and bitterness about sisters' behaviors processed in session today. Patient hurt, angry, frustrated. To do some journaling between now and her next session. To continue her work on her unresolved grief, frustrations, anger, and hurt between now and next session, along with a written homework assignment that  patient accepted and feels may be helpful for her at this point. Work to strengthen her boundaries, and allow herself to experience healthy grief. Trying to allow husband to be of support and her communication with him recently has seemed healthier. To work further on healthy boundaries. Did work well in session today and seemed to feel some more strength within herself.Continue to decrease self-sabotage, feelings of inadequacy, viewing self in negative ways, and use of healthier communication skills within her family and beyond.   Interventions: Cognitive Behavioral Therapy and Ego-Supportive  Long-term goal: Increase understanding of beliefs and messages that produce the worry and anxiety. Short-term goal: Reduce overall level, frequency, and intensity of the anxiety so that daily functioning is not impaired. Strategies: Patient to learn further about anxiety re: daily skills for managing it, and to use the skills effectively in everyday life.   Diagnosis:   ICD-10-CM   1. Generalized anxiety disorder  F41.1      Plan:  Patient in session today focusing more on her anxiety, anger, and the death of her mother a few weeks ago.  She has made progress and needs to continue her work with goal-directed behaviors to keep moving and a forward and healthier direction.  Reminded and encouraged patient to practice more often the positive and self affirming behaviors as noted and practiced in sessions including: Challenging and counteracting her self-doubt, pay attention to how she says what she says, stay in the present and focused on what she can change  or control, understand that her anxious thoughts are "just thoughts" and are not necessarily going to play out in reality the way she might fear, decrease her feelings of inadequacy within the family, interrupt and decrease her overthinking and over analyzing, refrain from assuming worst-case scenarios, use of positive self talk, work more intentionally on  setting healthier boundaries, be able to walk away from volatile situations when needed, and realize the strength she shows within herself to be able to work in a more focused manner on her goal-directed behaviors and move in a direction that supports her improved overall emotional health and wellbeing.  Goal review and progress/challenges noted with patient.  Next appointment within 1 to 2 weeks.   Mathis Fare, LCSW

## 2023-02-11 ENCOUNTER — Encounter (HOSPITAL_COMMUNITY): Payer: Self-pay

## 2023-02-11 ENCOUNTER — Inpatient Hospital Stay (HOSPITAL_COMMUNITY)
Admission: EM | Admit: 2023-02-11 | Discharge: 2023-02-16 | DRG: 853 | Disposition: A | Discharge status: Home / Self Care | Payer: Medicare Other | Attending: Internal Medicine | Admitting: Internal Medicine

## 2023-02-11 ENCOUNTER — Emergency Department (HOSPITAL_COMMUNITY): Payer: Medicare Other

## 2023-02-11 ENCOUNTER — Encounter (HOSPITAL_COMMUNITY)
Admission: EM | Disposition: A | Discharge status: Home / Self Care | Payer: Self-pay | Source: Home / Self Care | Attending: Pulmonary Disease

## 2023-02-11 ENCOUNTER — Inpatient Hospital Stay (HOSPITAL_COMMUNITY): Payer: Medicare Other

## 2023-02-11 ENCOUNTER — Inpatient Hospital Stay (HOSPITAL_COMMUNITY): Payer: Medicare Other | Admitting: Anesthesiology

## 2023-02-11 ENCOUNTER — Other Ambulatory Visit: Payer: Self-pay

## 2023-02-11 DIAGNOSIS — E890 Postprocedural hypothyroidism: Secondary | ICD-10-CM | POA: Diagnosis not present

## 2023-02-11 DIAGNOSIS — D696 Thrombocytopenia, unspecified: Secondary | ICD-10-CM | POA: Diagnosis not present

## 2023-02-11 DIAGNOSIS — N200 Calculus of kidney: Principal | ICD-10-CM

## 2023-02-11 DIAGNOSIS — K589 Irritable bowel syndrome without diarrhea: Secondary | ICD-10-CM | POA: Diagnosis present

## 2023-02-11 DIAGNOSIS — N12 Tubulo-interstitial nephritis, not specified as acute or chronic: Secondary | ICD-10-CM | POA: Diagnosis not present

## 2023-02-11 DIAGNOSIS — D649 Anemia, unspecified: Secondary | ICD-10-CM | POA: Diagnosis not present

## 2023-02-11 DIAGNOSIS — R6521 Severe sepsis with septic shock: Secondary | ICD-10-CM | POA: Diagnosis present

## 2023-02-11 DIAGNOSIS — K219 Gastro-esophageal reflux disease without esophagitis: Secondary | ICD-10-CM | POA: Diagnosis present

## 2023-02-11 DIAGNOSIS — R9431 Abnormal electrocardiogram [ECG] [EKG]: Secondary | ICD-10-CM | POA: Diagnosis present

## 2023-02-11 DIAGNOSIS — N281 Cyst of kidney, acquired: Secondary | ICD-10-CM | POA: Diagnosis not present

## 2023-02-11 DIAGNOSIS — Z880 Allergy status to penicillin: Secondary | ICD-10-CM

## 2023-02-11 DIAGNOSIS — E039 Hypothyroidism, unspecified: Secondary | ICD-10-CM | POA: Diagnosis not present

## 2023-02-11 DIAGNOSIS — E876 Hypokalemia: Secondary | ICD-10-CM | POA: Diagnosis not present

## 2023-02-11 DIAGNOSIS — E871 Hypo-osmolality and hyponatremia: Secondary | ICD-10-CM | POA: Diagnosis not present

## 2023-02-11 DIAGNOSIS — Z452 Encounter for adjustment and management of vascular access device: Secondary | ICD-10-CM | POA: Diagnosis not present

## 2023-02-11 DIAGNOSIS — F419 Anxiety disorder, unspecified: Secondary | ICD-10-CM | POA: Diagnosis present

## 2023-02-11 DIAGNOSIS — Z905 Acquired absence of kidney: Secondary | ICD-10-CM

## 2023-02-11 DIAGNOSIS — Z79890 Hormone replacement therapy (postmenopausal): Secondary | ICD-10-CM

## 2023-02-11 DIAGNOSIS — Z885 Allergy status to narcotic agent status: Secondary | ICD-10-CM

## 2023-02-11 DIAGNOSIS — N201 Calculus of ureter: Secondary | ICD-10-CM | POA: Diagnosis not present

## 2023-02-11 DIAGNOSIS — A419 Sepsis, unspecified organism: Secondary | ICD-10-CM | POA: Diagnosis not present

## 2023-02-11 DIAGNOSIS — N132 Hydronephrosis with renal and ureteral calculous obstruction: Secondary | ICD-10-CM | POA: Diagnosis not present

## 2023-02-11 DIAGNOSIS — R6520 Severe sepsis without septic shock: Secondary | ICD-10-CM

## 2023-02-11 DIAGNOSIS — A4151 Sepsis due to Escherichia coli [E. coli]: Principal | ICD-10-CM | POA: Diagnosis present

## 2023-02-11 DIAGNOSIS — Z79899 Other long term (current) drug therapy: Secondary | ICD-10-CM

## 2023-02-11 DIAGNOSIS — R000 Tachycardia, unspecified: Secondary | ICD-10-CM | POA: Diagnosis not present

## 2023-02-11 DIAGNOSIS — R599 Enlarged lymph nodes, unspecified: Secondary | ICD-10-CM | POA: Diagnosis not present

## 2023-02-11 DIAGNOSIS — N10 Acute tubulo-interstitial nephritis: Secondary | ICD-10-CM

## 2023-02-11 DIAGNOSIS — K5730 Diverticulosis of large intestine without perforation or abscess without bleeding: Secondary | ICD-10-CM | POA: Diagnosis not present

## 2023-02-11 DIAGNOSIS — N136 Pyonephrosis: Secondary | ICD-10-CM | POA: Diagnosis not present

## 2023-02-11 DIAGNOSIS — R109 Unspecified abdominal pain: Secondary | ICD-10-CM | POA: Diagnosis not present

## 2023-02-11 DIAGNOSIS — F32A Depression, unspecified: Secondary | ICD-10-CM | POA: Diagnosis not present

## 2023-02-11 DIAGNOSIS — N179 Acute kidney failure, unspecified: Secondary | ICD-10-CM | POA: Diagnosis not present

## 2023-02-11 DIAGNOSIS — B9620 Unspecified Escherichia coli [E. coli] as the cause of diseases classified elsewhere: Secondary | ICD-10-CM | POA: Diagnosis not present

## 2023-02-11 DIAGNOSIS — E872 Acidosis, unspecified: Secondary | ICD-10-CM | POA: Diagnosis not present

## 2023-02-11 DIAGNOSIS — R918 Other nonspecific abnormal finding of lung field: Secondary | ICD-10-CM | POA: Diagnosis not present

## 2023-02-11 DIAGNOSIS — N134 Hydroureter: Secondary | ICD-10-CM | POA: Diagnosis not present

## 2023-02-11 HISTORY — PX: CYSTOSCOPY W/ URETERAL STENT PLACEMENT: SHX1429

## 2023-02-11 LAB — CBC WITH DIFFERENTIAL/PLATELET
Abs Immature Granulocytes: 0.17 10*3/uL — ABNORMAL HIGH (ref 0.00–0.07)
Basophils Absolute: 0 10*3/uL (ref 0.0–0.1)
Basophils Relative: 0 %
Eosinophils Absolute: 0 10*3/uL (ref 0.0–0.5)
Eosinophils Relative: 0 %
HCT: 40.3 % (ref 36.0–46.0)
Hemoglobin: 13.5 g/dL (ref 12.0–15.0)
Immature Granulocytes: 1 %
Lymphocytes Relative: 3 %
Lymphs Abs: 0.6 10*3/uL — ABNORMAL LOW (ref 0.7–4.0)
MCH: 30.1 pg (ref 26.0–34.0)
MCHC: 33.5 g/dL (ref 30.0–36.0)
MCV: 90 fL (ref 80.0–100.0)
Monocytes Absolute: 1.3 10*3/uL — ABNORMAL HIGH (ref 0.1–1.0)
Monocytes Relative: 6 %
Neutro Abs: 20.7 10*3/uL — ABNORMAL HIGH (ref 1.7–7.7)
Neutrophils Relative %: 90 %
Platelets: 182 10*3/uL (ref 150–400)
RBC: 4.48 MIL/uL (ref 3.87–5.11)
RDW: 14.6 % (ref 11.5–15.5)
WBC: 22.8 10*3/uL — ABNORMAL HIGH (ref 4.0–10.5)
nRBC: 0 % (ref 0.0–0.2)

## 2023-02-11 LAB — URINALYSIS, W/ REFLEX TO CULTURE (INFECTION SUSPECTED)
Bacteria, UA: NONE SEEN
Bilirubin Urine: NEGATIVE
Glucose, UA: NEGATIVE mg/dL
Ketones, ur: NEGATIVE mg/dL
Nitrite: NEGATIVE
Protein, ur: 100 mg/dL — AB
Specific Gravity, Urine: >1.046 — ABNORMAL HIGH (ref 1.005–1.030)
pH: 6 (ref 5.0–8.0)

## 2023-02-11 LAB — COMPREHENSIVE METABOLIC PANEL
ALT: 28 U/L (ref 0–44)
AST: 34 U/L (ref 15–41)
Albumin: 3.5 g/dL (ref 3.5–5.0)
Alkaline Phosphatase: 79 U/L (ref 38–126)
Anion gap: 11 (ref 5–15)
BUN: 16 mg/dL (ref 8–23)
CO2: 18 mmol/L — ABNORMAL LOW (ref 22–32)
Calcium: 9 mg/dL (ref 8.9–10.3)
Chloride: 107 mmol/L (ref 98–111)
Creatinine, Ser: 1.38 mg/dL — ABNORMAL HIGH (ref 0.44–1.00)
GFR, Estimated: 43 mL/min — ABNORMAL LOW (ref >60)
Glucose, Bld: 145 mg/dL — ABNORMAL HIGH (ref 70–99)
Potassium: 4.1 mmol/L (ref 3.5–5.1)
Sodium: 136 mmol/L (ref 135–145)
Total Bilirubin: 1.8 mg/dL — ABNORMAL HIGH (ref <1.2)
Total Protein: 6.7 g/dL (ref 6.5–8.1)

## 2023-02-11 LAB — RESP PANEL BY RT-PCR (RSV, FLU A&B, COVID)  RVPGX2
Influenza A by PCR: NEGATIVE
Influenza B by PCR: NEGATIVE
Resp Syncytial Virus by PCR: NEGATIVE
SARS Coronavirus 2 by RT PCR: NEGATIVE

## 2023-02-11 LAB — MAGNESIUM: Magnesium: 1.7 mg/dL (ref 1.7–2.4)

## 2023-02-11 LAB — APTT: aPTT: 38 s — ABNORMAL HIGH (ref 24–36)

## 2023-02-11 LAB — LACTIC ACID, PLASMA: Lactic Acid, Venous: 4.8 mmol/L (ref 0.5–1.9)

## 2023-02-11 LAB — PROTIME-INR
INR: 1.2 (ref 0.8–1.2)
Prothrombin Time: 15.1 s (ref 11.4–15.2)

## 2023-02-11 LAB — LIPASE, BLOOD: Lipase: 37 U/L (ref 11–51)

## 2023-02-11 SURGERY — CYSTOSCOPY, WITH RETROGRADE PYELOGRAM AND URETERAL STENT INSERTION
Anesthesia: General | Site: Ureter | Laterality: Left

## 2023-02-11 MED ORDER — ENOXAPARIN SODIUM 30 MG/0.3ML IJ SOSY
30.0000 mg | PREFILLED_SYRINGE | Freq: Every day | INTRAMUSCULAR | Status: DC
  Administered 2023-02-12: 30 mg via SUBCUTANEOUS
  Filled 2023-02-11: qty 0.3

## 2023-02-11 MED ORDER — LACTATED RINGERS IV SOLN: INTRAVENOUS | Status: DC

## 2023-02-11 MED ORDER — SUCCINYLCHOLINE CHLORIDE 200 MG/10ML IV SOSY
PREFILLED_SYRINGE | INTRAVENOUS | Status: DC | PRN
  Administered 2023-02-11: 100 mg via INTRAVENOUS

## 2023-02-11 MED ORDER — FENTANYL CITRATE (PF) 100 MCG/2ML IJ SOLN
INTRAMUSCULAR | Status: AC
  Filled 2023-02-11: qty 2

## 2023-02-11 MED ORDER — MIDAZOLAM HCL 5 MG/5ML IJ SOLN
INTRAMUSCULAR | Status: DC | PRN
  Administered 2023-02-11: 2 mg via INTRAVENOUS

## 2023-02-11 MED ORDER — TRIMETHOBENZAMIDE HCL 100 MG/ML IM SOLN
200.0000 mg | Freq: Three times a day (TID) | INTRAMUSCULAR | Status: DC | PRN
  Administered 2023-02-12 – 2023-02-13 (×3): 200 mg via INTRAMUSCULAR
  Filled 2023-02-11 (×5): qty 2

## 2023-02-11 MED ORDER — SODIUM CHLORIDE 0.9 % IV SOLN
2.0000 g | INTRAVENOUS | Status: DC
  Administered 2023-02-11: 2 g via INTRAVENOUS
  Filled 2023-02-11: qty 20

## 2023-02-11 MED ORDER — IOHEXOL 300 MG/ML  SOLN
80.0000 mL | Freq: Once | INTRAMUSCULAR | Status: AC | PRN
  Administered 2023-02-11: 80 mL via INTRAVENOUS

## 2023-02-11 MED ORDER — PROPOFOL 10 MG/ML IV BOLUS
INTRAVENOUS | Status: DC | PRN
  Administered 2023-02-11: 100 mg via INTRAVENOUS

## 2023-02-11 MED ORDER — ACETAMINOPHEN 325 MG PO TABS
650.0000 mg | ORAL_TABLET | Freq: Four times a day (QID) | ORAL | Status: DC | PRN
  Administered 2023-02-11 – 2023-02-16 (×8): 650 mg via ORAL
  Filled 2023-02-11 (×8): qty 2

## 2023-02-11 MED ORDER — PROCHLORPERAZINE EDISYLATE 10 MG/2ML IJ SOLN
5.0000 mg | INTRAMUSCULAR | Status: DC | PRN
  Administered 2023-02-12 – 2023-02-13 (×3): 5 mg via INTRAVENOUS
  Filled 2023-02-11 (×3): qty 2

## 2023-02-11 MED ORDER — MAGNESIUM SULFATE IN D5W 1-5 GM/100ML-% IV SOLN
1.0000 g | Freq: Once | INTRAVENOUS | Status: AC
  Administered 2023-02-11: 1 g via INTRAVENOUS
  Filled 2023-02-11: qty 100

## 2023-02-11 MED ORDER — LACTATED RINGERS IV BOLUS (SEPSIS)
500.0000 mL | Freq: Once | INTRAVENOUS | Status: AC
  Administered 2023-02-11: 500 mL via INTRAVENOUS

## 2023-02-11 MED ORDER — SODIUM CHLORIDE 0.9 % IV BOLUS
1000.0000 mL | Freq: Once | INTRAVENOUS | Status: AC
  Administered 2023-02-11: 1000 mL via INTRAVENOUS

## 2023-02-11 MED ORDER — LEVOTHYROXINE SODIUM 75 MCG PO TABS
75.0000 ug | ORAL_TABLET | Freq: Every day | ORAL | Status: DC
  Administered 2023-02-12 – 2023-02-14 (×3): 75 ug via ORAL
  Filled 2023-02-11 (×3): qty 1

## 2023-02-11 MED ORDER — FLUOXETINE HCL 20 MG PO CAPS
80.0000 mg | ORAL_CAPSULE | Freq: Every day | ORAL | Status: DC
  Administered 2023-02-12 – 2023-02-13 (×2): 80 mg via ORAL
  Filled 2023-02-11 (×2): qty 4

## 2023-02-11 MED ORDER — SODIUM CHLORIDE 0.9 % IV SOLN
1.0000 g | Freq: Once | INTRAVENOUS | Status: DC
  Filled 2023-02-11: qty 10

## 2023-02-11 MED ORDER — SENNOSIDES-DOCUSATE SODIUM 8.6-50 MG PO TABS
1.0000 | ORAL_TABLET | Freq: Every evening | ORAL | Status: DC | PRN
  Administered 2023-02-13 (×2): 1 via ORAL
  Filled 2023-02-11 (×2): qty 1

## 2023-02-11 MED ORDER — DEXAMETHASONE SODIUM PHOSPHATE 10 MG/ML IJ SOLN
INTRAMUSCULAR | Status: DC | PRN
  Administered 2023-02-11: 10 mg via INTRAVENOUS

## 2023-02-11 MED ORDER — ALPRAZOLAM 0.5 MG PO TABS
0.5000 mg | ORAL_TABLET | Freq: Two times a day (BID) | ORAL | Status: DC | PRN
  Administered 2023-02-12: 0.5 mg via ORAL
  Filled 2023-02-11 (×2): qty 1

## 2023-02-11 MED ORDER — FENTANYL CITRATE (PF) 100 MCG/2ML IJ SOLN
INTRAMUSCULAR | Status: DC | PRN
  Administered 2023-02-11 (×2): 50 ug via INTRAVENOUS

## 2023-02-11 MED ORDER — SODIUM CHLORIDE 0.9 % IV BOLUS: 1000.0000 mL | Freq: Once | INTRAVENOUS | Status: DC

## 2023-02-11 MED ORDER — MIDAZOLAM HCL 2 MG/2ML IJ SOLN
INTRAMUSCULAR | Status: AC
  Filled 2023-02-11: qty 2

## 2023-02-11 MED ORDER — HYDROMORPHONE HCL 1 MG/ML IJ SOLN
0.5000 mg | INTRAMUSCULAR | Status: DC | PRN
  Administered 2023-02-11 – 2023-02-16 (×18): 0.5 mg via INTRAVENOUS
  Filled 2023-02-11 (×6): qty 1
  Filled 2023-02-11: qty 0.5
  Filled 2023-02-11 (×3): qty 1
  Filled 2023-02-11: qty 0.5
  Filled 2023-02-11 (×8): qty 1

## 2023-02-11 MED ORDER — ACETAMINOPHEN 650 MG RE SUPP: 650.0000 mg | Freq: Four times a day (QID) | RECTAL | Status: DC | PRN

## 2023-02-11 MED ORDER — SODIUM CHLORIDE 0.9 % IR SOLN
Status: DC | PRN
  Administered 2023-02-11: 1000 mL

## 2023-02-11 MED ORDER — TRIMETHOBENZAMIDE HCL 100 MG/ML IM SOLN
200.0000 mg | Freq: Once | INTRAMUSCULAR | Status: AC
  Administered 2023-02-11: 200 mg via INTRAMUSCULAR
  Filled 2023-02-11: qty 2

## 2023-02-11 MED ORDER — LACTATED RINGERS IV BOLUS (SEPSIS)
1000.0000 mL | Freq: Once | INTRAVENOUS | Status: AC
  Administered 2023-02-11: 1000 mL via INTRAVENOUS

## 2023-02-11 MED ORDER — IOHEXOL 300 MG/ML  SOLN
INTRAMUSCULAR | Status: DC | PRN
  Administered 2023-02-11: 10 mL via URETHRAL

## 2023-02-11 SURGICAL SUPPLY — 20 items
BAG URO CATCHER STRL LF (MISCELLANEOUS) ×1 IMPLANT
CATH URETL OPEN 5X70 (CATHETERS) IMPLANT
CLOTH BEACON ORANGE TIMEOUT ST (SAFETY) ×1 IMPLANT
GLOVE BIO SURGEON STRL SZ7 (GLOVE) IMPLANT
GLOVE BIO SURGEON STRL SZ8 (GLOVE) IMPLANT
GLOVE BIOGEL M 7.0 STRL (GLOVE) ×1 IMPLANT
GLOVE BIOGEL PI IND STRL 8 (GLOVE) IMPLANT
GOWN STRL REUS W/ TWL LRG LVL3 (GOWN DISPOSABLE) ×1 IMPLANT
GOWN STRL REUS W/TWL XL LVL3 (GOWN DISPOSABLE) IMPLANT
GUIDEWIRE STR DUAL SENSOR (WIRE) ×1 IMPLANT
GUIDEWIRE ZIPWRE .038 STRAIGHT (WIRE) IMPLANT
KIT TURNOVER KIT A (KITS) IMPLANT
MANIFOLD NEPTUNE II (INSTRUMENTS) ×1 IMPLANT
PACK CYSTO (CUSTOM PROCEDURE TRAY) ×1 IMPLANT
SHEATH DILATOR SET 8/10 (MISCELLANEOUS) IMPLANT
STENT URET 6FRX24 CONTOUR (STENTS) IMPLANT
SYR 10ML LL (SYRINGE) ×1 IMPLANT
TRAY FOLEY MTR SLVR 16FR STAT (SET/KITS/TRAYS/PACK) IMPLANT
TUBING CONNECTING 10 (TUBING) ×1 IMPLANT
TUBING UROLOGY SET (TUBING) IMPLANT

## 2023-02-11 NOTE — ED Provider Triage Note (Signed)
Emergency Medicine Provider Triage Evaluation Note  Valerie [name] , a 62 y.o. female  was evaluated in triage.  Pt complains of flank pain.  Patient reports that she has been experiencing several weeks of bilateral flank pain with worsening nausea and intractable vomiting.  Endorses some pressure over the suprapubic region but denies any significant urinary symptoms.  No hematuria.  States that the pain has become worse today.  History of kidney stones as well as bowel obstruction.  Review of Systems  Positive: As above Negative: As above  Physical Exam  BP 122/75   Pulse (!) 103   Temp 98.6 F (37 C)   Resp 19   Ht 5' (1.524 m)   Wt 49.4 kg   SpO2 97%   BMI 21.29 kg/m  Gen:   Awake, no distress   Resp:  Normal effort  MSK:   Moves extremities without difficulty  Other:  Mild tenderness to palpation in the suprapubic region, bilateral flank/CVA tenderness  Medical Decision Making  Medically screening exam initiated at 3:24 PM.  Appropriate orders placed.  Jordis Helfrich was informed that the remainder of the evaluation will be completed by another provider, this initial triage assessment does not replace that evaluation, and the importance of remaining in the ED until their evaluation is complete.     Smitty Knudsen, PA-C 02/11/23 1525

## 2023-02-11 NOTE — Anesthesia Procedure Notes (Signed)
Procedure Name: Intubation Date/Time: 02/11/2023 11:10 PM  Performed by: Bethena Midget, MDPre-anesthesia Checklist: Patient identified, Emergency Drugs available, Suction available and Patient being monitored Patient Re-evaluated:Patient Re-evaluated prior to induction Oxygen Delivery Method: Circle system utilized Preoxygenation: Pre-oxygenation with 100% oxygen Induction Type: IV induction Ventilation: Mask ventilation without difficulty and Mask ventilation with difficulty Laryngoscope Size: Mac and 3 Grade View: Grade I Tube type: Oral Tube size: 7.0 mm Number of attempts: 1 Airway Equipment and Method: Stylet and Oral airway Placement Confirmation: ETT inserted through vocal cords under direct vision, positive ETCO2 and breath sounds checked- equal and bilateral Secured at: 20 cm Tube secured with: Tape Dental Injury: Teeth and Oropharynx as per pre-operative assessment
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
## 2023-02-11 NOTE — ED Triage Notes (Addendum)
Pt arrives via POV. Pt reports bilateral flank pain and nausea for the past couple of weeks. States the pain became worse today. Reports hx of kidney stones. Pt AxOx4.
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
## 2023-02-11 NOTE — Anesthesia Postprocedure Evaluation (Signed)
Anesthesia Post Note  Patient: Valerie [name]  Procedure(s) Performed: CYSTOSCOPY WITH RETROGRADE PYELOGRAM/URETERAL STENT PLACEMENT (Left: Ureter)     Patient location during evaluation: PACU Anesthesia Type: General Level of consciousness: awake and alert Pain management: pain level controlled Vital Signs Assessment: post-procedure vital signs reviewed and stable Respiratory status: spontaneous breathing, nonlabored ventilation, respiratory function stable and patient connected to nasal cannula oxygen Cardiovascular status: blood pressure returned to baseline and stable Postop Assessment: no apparent nausea or vomiting Anesthetic complications: no   No notable events documented.  Last Vitals:  Vitals:   02/11/23 2140 02/11/23 2232  BP:  117/67  Pulse:  (!) 110  Resp:  19  Temp: (!) 39.5 C (!) 39.5 C  SpO2:  93%    Last Pain:  Vitals:   02/11/23 2140  TempSrc:   PainSc: 8                  Cullen Vanallen
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
## 2023-02-11 NOTE — ED Notes (Signed)
Pt to OR at this time.

## 2023-02-11 NOTE — Op Note (Addendum)
Preoperative diagnosis:  Left infected obstruction   Postoperative diagnosis:  same   Procedure:  Cystoscopy left ureteral stent placement left retrograde pyelography with interpretation   Surgeon: Jettie Pagan, MD Resident: Zettie Pho, MD  Anesthesia: General  Complications: None  Intraoperative findings:   -Uncomplicated 6Fx24 cm ureteral stent placement, RPG showing severe hydronephrosis, 13 mm stone appeared to be dislodged from UPJ  EBL: Minimal  Specimens: None  Indication: Valerie [name] is a 62 y.o. patient with left UPJ 13 mm stone. After reviewing the management options for treatment, he elected to proceed with the above surgical procedure(s). We have discussed the potential benefits and risks of the procedure, side effects of the proposed treatment, the likelihood of the patient achieving the goals of the procedure, and any potential problems that might occur during the procedure or recuperation. Informed consent has been obtained.  Description of procedure:  The patient was taken to the operating room and general anesthesia was induced.  The patient was placed in the dorsal lithotomy position, prepped and draped in the usual sterile fashion, and preoperative antibiotics were administered. A preoperative time-out was performed.   Cystourethroscopy was performed.  The patient's urethra was examined and was normal. The bladder was then systematically examined in its entirety. There was no evidence for any bladder tumors, stones, or other mucosal pathology.    Attention then turned to the leftureteral orifice and a ureteral catheter was used to intubate the ureteral orifice.  Omnipaque contrast was injected through the ureteral catheter and a retrograde pyelogram was performed with findings as dictated above.  A 0.38 sensor guidewire was then advanced up the left ureter into the renal pelvis under fluoroscopic guidance.  The wire was then backloaded through the  cystoscope and a ureteral stent was advance over the wire using Seldinger technique.  The stent was positioned appropriately under fluoroscopic and cystoscopic guidance.  The wire was then removed with an adequate stent curl noted in the renal pelvis as well as in the bladder.  The bladder was then decompressed with foley catheter. Urine was sent for culture.   Valerie Lofts, MD Resident Physician [facility]  I agree with above and was present for the entirety of the procedure.  Valerie R. Lillie Portner MD [facility]  Pager: [phone]

## 2023-02-11 NOTE — Anesthesia Preprocedure Evaluation (Addendum)
Anesthesia Evaluation  Patient identified by MRN, date of birth, ID band Patient awake    Reviewed: Allergy & Precautions, H&P , NPO status , Patient's Chart, lab work & pertinent test results  Airway Mallampati: I  TM Distance: >3 FB Neck ROM: Full    Dental no notable dental hx. (+) Teeth Intact, Edentulous Upper, Edentulous Lower,    Pulmonary neg pulmonary ROS   Pulmonary exam normal breath sounds clear to auscultation       Cardiovascular Exercise Tolerance: Good negative cardio ROS Normal cardiovascular exam Rhythm:Regular Rate:Normal     Neuro/Psych negative neurological ROS  negative psych ROS   GI/Hepatic negative GI ROS, Neg liver ROS,,,  Endo/Other  negative endocrine ROSHypothyroidism    Renal/GU ARFRenal diseasenegative Renal ROS  negative genitourinary   Musculoskeletal negative musculoskeletal ROS (+)    Abdominal   Peds negative pediatric ROS (+)  Hematology negative hematology ROS (+)   Anesthesia Other Findings   Reproductive/Obstetrics negative OB ROS                             Anesthesia Physical Anesthesia Plan  ASA: 3 and emergent  Anesthesia Plan: General   Post-op Pain Management: Minimal or no pain anticipated and Ofirmev IV (intra-op)*   Induction: Intravenous and Cricoid pressure planned  PONV Risk Score and Plan: 2 and Ondansetron, Dexamethasone and Treatment may vary due to age or medical condition  Airway Management Planned: Oral ETT  Additional Equipment: None  Intra-op Plan:   Post-operative Plan: Extubation in OR  Informed Consent: I have reviewed the patients History and Physical, chart, labs and discussed the procedure including the risks, benefits and alternatives for the proposed anesthesia with the patient or authorized representative who has indicated his/her understanding and acceptance.       Plan Discussed with:  Anesthesiologist and CRNA  Anesthesia Plan Comments: (  )        Anesthesia Quick Evaluation

## 2023-02-11 NOTE — Transfer of Care (Signed)
Immediate Anesthesia Transfer of Care Note  Patient: Valerie [name]  Procedure(s) Performed: CYSTOSCOPY WITH RETROGRADE PYELOGRAM/URETERAL STENT PLACEMENT (Left)  Patient Location: PACU  Anesthesia Type:General  Level of Consciousness: awake  Airway & Oxygen Therapy: Patient Spontanous Breathing  Post-op Assessment: Report given to RN  Post vital signs: Reviewed and stable  Last Vitals:  Vitals Value Taken Time  BP 102/59 02/11/23 2338  Temp    Pulse 105 02/11/23 2343  Resp 28 02/11/23 2343  SpO2 100 % 02/11/23 2343  Vitals shown include unfiled device data.  Last Pain:  Vitals:   02/11/23 2140  TempSrc:   PainSc: 8          Complications: No notable events documented.

## 2023-02-12 ENCOUNTER — Inpatient Hospital Stay (HOSPITAL_COMMUNITY): Payer: Medicare Other

## 2023-02-12 ENCOUNTER — Encounter (HOSPITAL_COMMUNITY): Payer: Self-pay | Admitting: Family Medicine

## 2023-02-12 DIAGNOSIS — E876 Hypokalemia: Secondary | ICD-10-CM | POA: Diagnosis not present

## 2023-02-12 DIAGNOSIS — N10 Acute tubulo-interstitial nephritis: Secondary | ICD-10-CM

## 2023-02-12 DIAGNOSIS — R918 Other nonspecific abnormal finding of lung field: Secondary | ICD-10-CM | POA: Diagnosis not present

## 2023-02-12 DIAGNOSIS — N132 Hydronephrosis with renal and ureteral calculous obstruction: Secondary | ICD-10-CM | POA: Diagnosis not present

## 2023-02-12 DIAGNOSIS — A419 Sepsis, unspecified organism: Secondary | ICD-10-CM

## 2023-02-12 DIAGNOSIS — Z452 Encounter for adjustment and management of vascular access device: Secondary | ICD-10-CM | POA: Diagnosis not present

## 2023-02-12 DIAGNOSIS — E871 Hypo-osmolality and hyponatremia: Secondary | ICD-10-CM | POA: Diagnosis not present

## 2023-02-12 LAB — BASIC METABOLIC PANEL
Anion gap: 6 (ref 5–15)
Anion gap: 5 (ref 5–15)
BUN: 17 mg/dL (ref 8–23)
BUN: 17 mg/dL (ref 8–23)
CO2: 20 mmol/L — ABNORMAL LOW (ref 22–32)
CO2: 20 mmol/L — ABNORMAL LOW (ref 22–32)
Calcium: 7.6 mg/dL — ABNORMAL LOW (ref 8.9–10.3)
Calcium: 7.5 mg/dL — ABNORMAL LOW (ref 8.9–10.3)
Chloride: 106 mmol/L (ref 98–111)
Chloride: 107 mmol/L (ref 98–111)
Creatinine, Ser: 1.29 mg/dL — ABNORMAL HIGH (ref 0.44–1.00)
Creatinine, Ser: 0.99 mg/dL (ref 0.44–1.00)
GFR, Estimated: 46 mL/min — ABNORMAL LOW (ref >60)
GFR, Estimated: >60 mL/min (ref >60)
Glucose, Bld: 123 mg/dL — ABNORMAL HIGH (ref 70–99)
Glucose, Bld: 127 mg/dL — ABNORMAL HIGH (ref 70–99)
Potassium: 3.1 mmol/L — ABNORMAL LOW (ref 3.5–5.1)
Potassium: 4.4 mmol/L (ref 3.5–5.1)
Sodium: 132 mmol/L — ABNORMAL LOW (ref 135–145)
Sodium: 132 mmol/L — ABNORMAL LOW (ref 135–145)

## 2023-02-12 LAB — BLOOD CULTURE ID PANEL (REFLEXED) - BCID2

## 2023-02-12 LAB — URINALYSIS, W/ REFLEX TO CULTURE (INFECTION SUSPECTED)
Bacteria, UA: NONE SEEN
Bilirubin Urine: NEGATIVE
Glucose, UA: NEGATIVE mg/dL
Ketones, ur: NEGATIVE mg/dL
Nitrite: NEGATIVE
Protein, ur: 100 mg/dL — AB
RBC / HPF: >50 RBC/hpf (ref 0–5)
Specific Gravity, Urine: 1.016 (ref 1.005–1.030)
WBC, UA: >50 WBC/hpf (ref 0–5)
pH: 5 (ref 5.0–8.0)

## 2023-02-12 LAB — CBC WITH DIFFERENTIAL/PLATELET
Abs Immature Granulocytes: 0.18 10*3/uL — ABNORMAL HIGH (ref 0.00–0.07)
Basophils Absolute: 0 10*3/uL (ref 0.0–0.1)
Basophils Relative: 0 %
Eosinophils Absolute: 0 10*3/uL (ref 0.0–0.5)
Eosinophils Relative: 0 %
HCT: 30.7 % — ABNORMAL LOW (ref 36.0–46.0)
Hemoglobin: 10.2 g/dL — ABNORMAL LOW (ref 12.0–15.0)
Immature Granulocytes: 1 %
Lymphocytes Relative: 3 %
Lymphs Abs: 0.5 10*3/uL — ABNORMAL LOW (ref 0.7–4.0)
MCH: 30 pg (ref 26.0–34.0)
MCHC: 33.2 g/dL (ref 30.0–36.0)
MCV: 90.3 fL (ref 80.0–100.0)
Monocytes Absolute: 0.6 10*3/uL (ref 0.1–1.0)
Monocytes Relative: 3 %
Neutro Abs: 17.3 10*3/uL — ABNORMAL HIGH (ref 1.7–7.7)
Neutrophils Relative %: 93 %
Platelets: 106 10*3/uL — ABNORMAL LOW (ref 150–400)
RBC: 3.4 MIL/uL — ABNORMAL LOW (ref 3.87–5.11)
RDW: 14.8 % (ref 11.5–15.5)
WBC: 18.6 10*3/uL — ABNORMAL HIGH (ref 4.0–10.5)
nRBC: 0 % (ref 0.0–0.2)

## 2023-02-12 LAB — URINE CULTURE: Culture: NO GROWTH

## 2023-02-12 LAB — GLUCOSE, CAPILLARY: Glucose-Capillary: 104 mg/dL — ABNORMAL HIGH (ref 70–99)

## 2023-02-12 LAB — LACTIC ACID, PLASMA
Lactic Acid, Venous: 2.9 mmol/L (ref 0.5–1.9)
Lactic Acid, Venous: 1.1 mmol/L (ref 0.5–1.9)

## 2023-02-12 LAB — MAGNESIUM: Magnesium: 1.7 mg/dL (ref 1.7–2.4)

## 2023-02-12 LAB — MRSA NEXT GEN BY PCR, NASAL: MRSA by PCR Next Gen: NOT DETECTED

## 2023-02-12 MED ORDER — SODIUM CHLORIDE 0.9 % IV BOLUS
1000.0000 mL | Freq: Once | INTRAVENOUS | Status: AC
  Administered 2023-02-12: 1000 mL via INTRAVENOUS

## 2023-02-12 MED ORDER — ORAL CARE MOUTH RINSE: 15.0000 mL | OROMUCOSAL | Status: DC | PRN

## 2023-02-12 MED ORDER — SODIUM CHLORIDE 0.9 % IV SOLN: 250.0000 mL | INTRAVENOUS | Status: DC

## 2023-02-12 MED ORDER — TEMAZEPAM 15 MG PO CAPS
15.0000 mg | ORAL_CAPSULE | Freq: Once | ORAL | Status: AC
  Administered 2023-02-12: 15 mg via ORAL
  Filled 2023-02-12: qty 1

## 2023-02-12 MED ORDER — LACTATED RINGERS IV BOLUS
500.0000 mL | Freq: Once | INTRAVENOUS | Status: AC
  Administered 2023-02-12: 500 mL via INTRAVENOUS

## 2023-02-12 MED ORDER — MAGNESIUM SULFATE 2 GM/50ML IV SOLN
2.0000 g | Freq: Once | INTRAVENOUS | Status: AC
  Administered 2023-02-12: 2 g via INTRAVENOUS
  Filled 2023-02-12: qty 50

## 2023-02-12 MED ORDER — SODIUM CHLORIDE 0.9 % IV SOLN
250.0000 mL | INTRAVENOUS | Status: DC
  Administered 2023-02-12: 250 mL via INTRAVENOUS

## 2023-02-12 MED ORDER — SODIUM CHLORIDE 0.9 % IV SOLN
2.0000 g | INTRAVENOUS | Status: DC
  Administered 2023-02-12 – 2023-02-14 (×3): 2 g via INTRAVENOUS
  Filled 2023-02-12 (×3): qty 20

## 2023-02-12 MED ORDER — LACTATED RINGERS IV SOLN: INTRAVENOUS | Status: AC

## 2023-02-12 MED ORDER — CHLORHEXIDINE GLUCONATE CLOTH 2 % EX PADS
6.0000 | MEDICATED_PAD | Freq: Every day | CUTANEOUS | Status: DC
  Administered 2023-02-12 – 2023-02-16 (×5): 6 via TOPICAL

## 2023-02-12 MED ORDER — POTASSIUM CHLORIDE 10 MEQ/100ML IV SOLN
10.0000 meq | INTRAVENOUS | Status: AC
  Administered 2023-02-12 (×6): 10 meq via INTRAVENOUS
  Filled 2023-02-12 (×6): qty 100

## 2023-02-12 MED ORDER — LACTATED RINGERS IV SOLN
INTRAVENOUS | Status: AC
Start: 2023-02-12 — End: 2023-02-13

## 2023-02-12 MED ORDER — VANCOMYCIN HCL IN DEXTROSE 1-5 GM/200ML-% IV SOLN
1000.0000 mg | Freq: Once | INTRAVENOUS | Status: AC
Start: 2023-02-12 — End: 2023-02-12
  Administered 2023-02-12: 1000 mg via INTRAVENOUS
  Filled 2023-02-12: qty 200

## 2023-02-12 MED ORDER — SODIUM CHLORIDE 0.9 % IV SOLN
2.0000 g | Freq: Two times a day (BID) | INTRAVENOUS | Status: DC
  Administered 2023-02-12: 2 g via INTRAVENOUS
  Filled 2023-02-12: qty 12.5

## 2023-02-12 MED ORDER — VANCOMYCIN HCL 750 MG/150ML IV SOLN: 750.0000 mg | INTRAVENOUS | Status: DC

## 2023-02-12 MED ORDER — NOREPINEPHRINE 4 MG/250ML-% IV SOLN
2.0000 ug/min | INTRAVENOUS | Status: DC
  Administered 2023-02-12 (×2): 5 ug/min via INTRAVENOUS
  Administered 2023-02-12: 2 ug/min via INTRAVENOUS
  Administered 2023-02-13: 4 ug/min via INTRAVENOUS
  Filled 2023-02-12 (×4): qty 250

## 2023-02-12 NOTE — TOC Initial Note (Signed)
Transition of Care [facility]) - Initial/Assessment Note    Patient Details  Name: Valerie [name] MRN: [id number] Date of Birth: 06-15-1960  [facility] CM/SW Contact:    Adrian Prows, RN Phone Number: 02/12/2023, 11:57 AM  Clinical Narrative:                 TOC for d/c planning; spoke w/ pt in room; pt says she lives at home w/ her spouse Valerie [name] [phone]); she verified she has insurance/PCP; she has transportation; pt denies SDOH risks; she says she has difficulty paying for medications d/t cost; pt says she has spoken with her insurance; pt says she does not have DME, HH services, or home oxygen; she agrees to receive resource for financial assistance; resource placed in follow up provider section of d/c instructions; copy also give to pt; she will contact agency of choice; TOC is following.  Expected Discharge Plan: Home/Self Care Barriers to Discharge: Continued Medical Work up   Patient Goals and CMS Choice Patient states their goals for this hospitalization and ongoing recovery are:: home CMS Medicare.gov Compare Post Acute Care list provided to:: Patient   [place] ownership interest in [facility].provided to:: Patient    Expected Discharge Plan and Services   Discharge Planning Services: CM Consult   Living arrangements for the past 2 months: Apartment                                      Prior Living Arrangements/Services Living arrangements for the past 2 months: Apartment Lives with:: Spouse Patient language and need for interpreter reviewed:: Yes Do you feel safe going back to the place where you live?: Yes      Need for Family Participation in Patient Care: Yes (Comment) Care giver support system in place?: Yes (comment) Current home services:  (n/a) Criminal Activity/Legal Involvement Pertinent to Current Situation/Hospitalization: No - Comment as needed  Activities of Daily Living   ADL Screening  (condition at time of admission) Independently performs ADLs?: Yes (appropriate for developmental age) Is the patient deaf or have difficulty hearing?: No Does the patient have difficulty seeing, even when wearing glasses/contacts?: No Does the patient have difficulty concentrating, remembering, or making decisions?: No  Permission Sought/Granted Permission sought to share information with : Case Manager Permission granted to share information with : Yes, Verbal Permission Granted  Share Information with NAME: Case Manager     Permission granted to share info w Relationship: Darrel Holtmeyer (spouse) [phone]     Emotional Assessment Appearance:: Appears stated age Attitude/Demeanor/Rapport: Gracious Affect (typically observed): Accepting Orientation: : Oriented to Self, Oriented to Place, Oriented to  Time, Oriented to Situation Alcohol / Substance Use: Not Applicable Psych Involvement: No (comment)  Admission diagnosis:  Nephrolithiasis [N20.0] Pyelonephritis [N12] AKI (acute kidney injury) (HCC) [N17.9] Acute pyelonephritis [N10] Patient Active Problem List   Diagnosis Date Noted   Nephrolithiasis 02/11/2023   Acute pyelonephritis 02/11/2023   AKI (acute kidney injury) (HCC) 02/11/2023   SBO (small bowel obstruction) (HCC) 04/21/2022   Hypothyroidism 04/21/2022   QT prolongation 04/21/2022   PCP:  [name], MD Pharmacy:   [id number] - [place], [place] - [address] [place] [address] Phone: [phone] Fax: [phone]     Social Drivers of Health (SDOH) Social History: SDOH Screenings   Food Insecurity: No Food Insecurity (02/12/2023)  Housing:  Low Risk  (02/12/2023)  Transportation Needs: No Transportation Needs (02/12/2023)  Utilities: Not At Risk (02/12/2023)  Depression (PHQ2-9): Low Risk  (10/13/2021)  Tobacco Use: Low Risk  (02/12/2023)   SDOH Interventions: Food Insecurity Interventions:  Intervention Not Indicated, Inpatient TOC Housing Interventions: Intervention Not Indicated, Inpatient TOC Transportation Interventions: Intervention Not Indicated, Inpatient TOC Utilities Interventions: Intervention Not Indicated, Inpatient TOC   Readmission Risk Interventions    02/12/2023   11:55 AM  Readmission Risk Prevention Plan  Transportation Screening Complete  PCP or Specialist Appt within 5-7 Days Complete  Home Care Screening Complete  Medication Review (RN CM) Complete

## 2023-02-12 NOTE — Progress Notes (Signed)
Pharmacy Antibiotic Note  Valerie [name] is a 62 y.o. female admitted on 02/11/2023 with left UPJ 13 mm stone .  Pharmacy has been consulted for vanc and cefepime for sepsis.  Plan: Vanc 1gm IV x 1 then 750mg  q36h (AUC 458.4, Scr 1.29) Cefepime 2gm IV q12h Follow renal function, cultures and clinical course   Height: 5' (152.4 cm) Weight: 49.4 kg (109 lb) IBW/kg (Calculated) : 45.5  Temp (24hrs), Avg:100.9 F (38.3 C), Min:98.2 F (36.8 C), Max:103.1 F (39.5 C)  Recent Labs  Lab 02/11/23 1536 02/11/23 2021 02/12/23 0101 02/12/23 0120  WBC 22.8*  --  18.6*  --   CREATININE 1.38*  --  1.29*  --   LATICACIDVEN  --  4.8*  --  2.9*    Estimated Creatinine Clearance: 31.6 mL/min (A) (by C-G formula based on SCr of 1.29 mg/dL (H)).    Allergies  Allergen Reactions   Oxycodone Hives and Itching   Oxycodone-Acetaminophen Hives and Itching   Potassium Other (See Comments)    Causes severe headaches   Penicillins Hives, Itching, Palpitations and Other (See Comments)    Did it involve swelling of the face/tongue/throat, SOB, or low BP? No Did it involve sudden or severe rash/hives, skin peeling, or any reaction on the inside of your mouth or nose? Yes Did you need to seek medical attention at a hospital or doctor's office? No When did it last happen?      Over 30 Years Ago If all above answers are "NO", may proceed with cephalosporin use.      Antimicrobials this admission: 12/12 CTX x1 12/13 vanc >> 12/13 cefepime >>  Dose adjustments this admission:   Microbiology results: 12/13 BCx:  12/13 UCx:   Thank you for allowing pharmacy to be a part of this patient's care.  Arley Phenix RPh 02/12/2023, 2:27 AM
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
## 2023-02-12 NOTE — Consult Note (Signed)
NAME:  Valerie [name], MRN:  [id number], DOB:  [birth year], LOS: 1 ADMISSION DATE:  02/11/2023, CONSULTATION DATE:  02/12/23 REFERRING MD:  Gery Pray, MD CHIEF COMPLAINT:  septic shock   History of Present Illness:  Valerie [name] is a 62 year old woman with history of hypothyroidism and nephrolithiasis who was admitted 12/12 for bilateraly lower back pain with nausea and vomiting found to have 13 mm left UPJ junction stone with mild left hydronephrosis and superimposed pyelonephritis s/p cystoscopy with left ureteral stent placement by urology. Post procedure patient developed hypotension. She was given fluid resuscitation without improvement in her BP and started on peripheral levophed. She was transferred to the ICU.   PCCM consulted to take over management due to septic shock.  Pertinent  Medical History  Hx of small bowel obstruction  Significant Hospital Events: Including procedures, antibiotic start and stop dates in addition to other pertinent events   12/12 admitted for pyelonephritis s/p cystoscopy with left ureteral stent placement 12/13 transferred to ICU for septic shock on levophed  Interim History / Subjective:   Complains of nausea, denies abdominal pain. She continues to have back pain.  Objective   Blood pressure (!) 95/58, pulse 100, temperature 99.5 F (37.5 C), resp. rate 15, height 5' (1.524 m), weight 49.4 kg, SpO2 93%.        Intake/Output Summary (Last 24 hours) at 02/12/2023 0549 Last data filed at 02/12/2023 0431 Gross per 24 hour  Intake 3979.96 ml  Output 305 ml  Net 3674.96 ml   Filed Weights   02/11/23 1257  Weight: 49.4 kg    Examination: General: elderly woman, no acute distress, resting in bed HENT: [place]/AT, moist mucous membranes Lungs: clear to auscultation, no wheezing Cardiovascular: rrr, no murmurs Abdomen: soft, non-tender, non-distended Extremities: warm, no edema Neuro: alert, awake, moving all extremities GU: foley in lace  - clear yellow urine  Resolved Hospital Problem list     Assessment & Plan:   Septic Shock due to Pyelonephritis - s/p left ureteral stent placement by urology - continue vasopressor support for MAP goal 65 or greater - continue fluid resuscitation with LR 190mL/hr - follow up cultures - continue cefepime, discontinue vancomycin.  Acute Kidney Injury Lactic Acidosis Hypokalemia Mild hyponatremia - in setting above - monitor UOP and serum Cr - fluid resuscitation - replete electrolytes - lactic acid is trending down  Thrombocytopenia Anemia - in setting of sepsis - monitor  Hypothyroidism - continue home synthroid  Depression - on seroquel and prozac  Hx of prolonged QT - monitor  Best Practice (right click and "Reselect all SmartList Selections" daily)   Diet/type: clear liquids DVT prophylaxis SCD Pressure ulcer(s): N/A GI prophylaxis: N/A Lines: N/A Foley:  Yes, and it is still needed Code Status:  full code Last date of multidisciplinary goals of care discussion [12/13 patient is full code]  Labs   CBC: Recent Labs  Lab 02/11/23 1536 02/12/23 0101  WBC 22.8* 18.6*  NEUTROABS 20.7* 17.3*  HGB 13.5 10.2*  HCT 40.3 30.7*  MCV 90.0 90.3  PLT 182 106*    Basic Metabolic Panel: Recent Labs  Lab 02/11/23 1536 02/11/23 2021 02/12/23 0101  NA 136  --  132*  K 4.1  --  3.1*  CL 107  --  106  CO2 18*  --  20*  GLUCOSE 145*  --  123*  BUN 16  --  17  CREATININE 1.38*  --  1.29*  CALCIUM 9.0  --  7.6*  MG  --  1.7 1.7   GFR: Estimated Creatinine Clearance: 31.6 mL/min (A) (by C-G formula based on SCr of 1.29 mg/dL (H)). Recent Labs  Lab 02/11/23 1536 02/11/23 2021 02/12/23 0101 02/12/23 0120  WBC 22.8*  --  18.6*  --   LATICACIDVEN  --  4.8*  --  2.9*    Liver Function Tests: Recent Labs  Lab 02/11/23 1536  AST 34  ALT 28  ALKPHOS 79  BILITOT 1.8*  PROT 6.7  ALBUMIN 3.5   Recent Labs  Lab 02/11/23 1536  LIPASE 37   No  results for input(s): "AMMONIA" in the last 168 hours.  ABG No results found for: "PHART", "PCO2ART", "PO2ART", "HCO3", "TCO2", "ACIDBASEDEF", "O2SAT"   Coagulation Profile: Recent Labs  Lab 02/11/23 2021  INR 1.2    Cardiac Enzymes: No results for input(s): "CKTOTAL", "CKMB", "CKMBINDEX", "TROPONINI" in the last 168 hours.  HbA1C: No results found for: "HGBA1C"  CBG: No results for input(s): "GLUCAP" in the last 168 hours.  Review of Systems:   Review of Systems  Constitutional:  Positive for malaise/fatigue. Negative for chills, fever and weight loss.  HENT:  Negative for congestion, sinus pain and sore throat.   Eyes: Negative.   Respiratory:  Negative for cough, hemoptysis, sputum production, shortness of breath and wheezing.   Cardiovascular:  Negative for chest pain, palpitations, orthopnea, claudication and leg swelling.  Gastrointestinal:  Negative for abdominal pain, heartburn, nausea and vomiting.  Genitourinary: Negative.   Musculoskeletal:  Positive for back pain. Negative for joint pain and myalgias.  Skin:  Negative for rash.  Neurological:  Negative for weakness.  Endo/Heme/Allergies: Negative.   Psychiatric/Behavioral: Negative.       Past Medical History:  She,  has a past medical history of Small bowel obstruction (HCC).   Surgical History:   Past Surgical History:  Procedure Laterality Date   OTHER SURGICAL HISTORY     LINX procedure, do not insert traditional NG     Social History:   reports that she has never smoked. She has never used smokeless tobacco. She reports that she does not drink alcohol and does not use drugs.   Family History:  Her family history is negative for Breast cancer.   Allergies Allergies  Allergen Reactions   Oxycodone Hives and Itching   Oxycodone-Acetaminophen Hives and Itching   Potassium Other (See Comments)    Causes severe headaches   Penicillins Hives, Itching, Palpitations and Other (See Comments)     Did it involve swelling of the face/tongue/throat, SOB, or low BP? No Did it involve sudden or severe rash/hives, skin peeling, or any reaction on the inside of your mouth or nose? Yes Did you need to seek medical attention at a hospital or doctor's office? No When did it last happen?      Over 30 Years Ago If all above answers are "NO", may proceed with cephalosporin use.       Home Medications  Prior to Admission medications   Medication Sig Start Date End Date Taking? Authorizing Provider  ALPRAZolam Prudy Feeler) 0.5 MG tablet Take 1 tablet by mouth twice daily as needed Patient taking differently: Take 0.5 mg by mouth 2 (two) times daily. 12/08/22  Yes White, Arlys John A, NP  docusate sodium (COLACE) 100 MG capsule Take 100 mg by mouth daily.   Yes [provider]  FLUoxetine (PROZAC) 20 MG capsule Take 4 capsules (80 mg total) by mouth daily. Patient taking differently: Take 40  mg by mouth 2 (two) times daily. 07/13/22  Yes Joan Flores, NP  folic acid (FOLVITE) 1 MG tablet Take 1 mg by mouth daily. 03/22/22  Yes [provider]  levothyroxine (SYNTHROID) 75 MCG tablet Take 37.5-75 mcg by mouth See admin instructions. Take 37.5 mcg by mouth in the morning before breakfast on Sunday and 75 mcg on Mon/Tues/Wed/Thurs/Fri/Sat 09/22/21  Yes [provider]  LINZESS 290 MCG CAPS capsule Take 290 mcg by mouth every morning. 09/29/21  Yes [provider]  omeprazole (PRILOSEC) 40 MG capsule Take 40 mg by mouth daily as needed (heartburn). 09/29/21  Yes [provider]  QUEtiapine (SEROQUEL) 100 MG tablet Take 1 tablet (100 mg total) by mouth at bedtime. 12/14/22  Yes White, [name], NP  senna (SENOKOT) 8.6 MG TABS tablet Take 4 tablets by mouth in the morning.   Yes [provider]  temazepam (RESTORIL) 30 MG capsule Take 1 capsule (30 mg total) by mouth at bedtime. 12/14/22  Yes White, Arlys John A, NP  potassium chloride SA (KLOR-CON M) 20 MEQ tablet Take 2  tablets (40 mEq total) by mouth daily for 3 days. Patient not taking: Reported on 02/11/2023 04/25/22 02/11/23  Burnadette Pop, MD  QUEtiapine (SEROQUEL) 25 MG tablet TAKE 2 TABLETS BY MOUTH AT BEDTIME Patient not taking: Reported on 02/11/2023 06/14/22   Joan Flores, NP     Critical care time: 40 minutes    Melody Comas, MD [facility] Office: [phone]   See Amion for personal pager PCCM on call pager [phone] until 7pm. Please call Elink 7p-7a. [phone]

## 2023-02-12 NOTE — Progress Notes (Signed)
Called by nursing, blood pressure 83/63, 1 L NS bolus ordered.  Repeat BP 77/53 with a MAP of 60 post bolus.  Total of 3.5 L NS bolus given.  Patient transferred to ICU.  Antibiotics broadened to include cefepime and vancomycin.  Norepinephrine initiated.  Critical care Dr. Francine Graven to see patient

## 2023-02-12 NOTE — Progress Notes (Signed)
Still reporting back pain.  Hemodynamics  Still tachycardic and req NE infusion.   Lab eval  Na 132, K 3.1 WBC trending down some.  Plan Cont K  Repeat afternoon labs Cont supportive care   Simonne Martinet ACNP-BC [facility] Pulmonary/Critical Care Pager # [phone] OR # [phone] if no answer

## 2023-02-12 NOTE — Progress Notes (Signed)
PHARMACY - PHYSICIAN COMMUNICATION CRITICAL VALUE ALERT - BLOOD CULTURE IDENTIFICATION (BCID)  Valerie [name] is an 62 y.o. female who presented to [facility] on 02/11/2023 with a chief complaint of flank pain, history of kidney stones.    Assessment:  obstructing nephrolithiasis, hydronephrosis, and superimposed pyelonephritis s/p cystoscopy with left ureteral stent placement by urology on 12/12.    BCx: GNR in 2/4 bottles (one bottle from each of 2 sets)  BCID: Ecoli, no resistance noted.   Name of physician (or Provider) Contacted: Anders Simmonds NP  Current antibiotics: Cefepime  Changes to prescribed antibiotics recommended:  Recommendations accepted by provider Ceftriaxone 2g IV q24h  Results for orders placed or performed during the hospital encounter of 02/11/23  Blood Culture ID Panel (Reflexed) (Collected: 02/11/2023  8:21 PM)  Result Value Ref Range   Enterococcus faecalis NOT DETECTED NOT DETECTED   Enterococcus Faecium NOT DETECTED NOT DETECTED   Listeria monocytogenes NOT DETECTED NOT DETECTED   Staphylococcus species NOT DETECTED NOT DETECTED   Staphylococcus aureus (BCID) NOT DETECTED NOT DETECTED   Staphylococcus epidermidis NOT DETECTED NOT DETECTED   Staphylococcus lugdunensis NOT DETECTED NOT DETECTED   Streptococcus species NOT DETECTED NOT DETECTED   Streptococcus agalactiae NOT DETECTED NOT DETECTED   Streptococcus pneumoniae NOT DETECTED NOT DETECTED   Streptococcus pyogenes NOT DETECTED NOT DETECTED   A.calcoaceticus-baumannii NOT DETECTED NOT DETECTED   Bacteroides fragilis NOT DETECTED NOT DETECTED   Enterobacterales DETECTED (A) NOT DETECTED   Enterobacter cloacae complex NOT DETECTED NOT DETECTED   Escherichia coli DETECTED (A) NOT DETECTED   Klebsiella aerogenes NOT DETECTED NOT DETECTED   Klebsiella oxytoca NOT DETECTED NOT DETECTED   Klebsiella pneumoniae NOT DETECTED NOT DETECTED   Proteus species NOT DETECTED NOT DETECTED   Salmonella species  NOT DETECTED NOT DETECTED   Serratia marcescens NOT DETECTED NOT DETECTED   Haemophilus influenzae NOT DETECTED NOT DETECTED   Neisseria meningitidis NOT DETECTED NOT DETECTED   Pseudomonas aeruginosa NOT DETECTED NOT DETECTED   Stenotrophomonas maltophilia NOT DETECTED NOT DETECTED   Candida albicans NOT DETECTED NOT DETECTED   Candida auris NOT DETECTED NOT DETECTED   Candida glabrata NOT DETECTED NOT DETECTED   Candida krusei NOT DETECTED NOT DETECTED   Candida parapsilosis NOT DETECTED NOT DETECTED   Candida tropicalis NOT DETECTED NOT DETECTED   Cryptococcus neoformans/gattii NOT DETECTED NOT DETECTED   CTX-M ESBL NOT DETECTED NOT DETECTED   Carbapenem resistance IMP NOT DETECTED NOT DETECTED   Carbapenem resistance KPC NOT DETECTED NOT DETECTED   Carbapenem resistance NDM NOT DETECTED NOT DETECTED   Carbapenem resist OXA 48 LIKE NOT DETECTED NOT DETECTED   Carbapenem resistance VIM NOT DETECTED NOT DETECTED    Lynann Beaver PharmD, BCPS [facility] main pharmacy [phone] 02/12/2023 10:54 AM

## 2023-02-12 NOTE — Progress Notes (Signed)
Pt arrived to floor from PACU following cystoscopy with L ureteral stent placement. Pt settled in and oriented to floor and call bell usage. Pts vitals taken upon arrival to unit and pt was hypotensive with BP of 83/58. Pt asymptomatic at time with no complaints. Admitting provider Dr. Joneen Roach made aware and orders placed for 1L fluid bolus. 1L fluid bolus administered and BP post bolus 77/53. BP rechecked manually, still 76/54, pt asymptomatic. Dr. Joneen Roach made aware and orders placed to transfer pt to SDU for pressors. Pt safely transferred to room 1228 with all belongings. Bedside report given to SDU RN [facility]

## 2023-02-12 NOTE — Progress Notes (Signed)
BP 121/72   Pulse 78   Temp (!) 97.5 F (36.4 C) (Oral)   Resp 15   Ht 5' (1.524 m)   Wt 49.4 kg   SpO2 98%   BMI 21.29 kg/m   cefTRIAXone (ROCEPHIN)  IV Stopped (02/12/23 1550)   norepinephrine (LEVOPHED) Adult infusion 5 mcg/min (02/12/23 1518)   Resting in bed. No distress Hent NCAT no JVD now has right internal jugular CVL Pulm decreased bases Card rrr Abd soft Ext warm and dry  Gu yellow urine  Neuro awake oriented no focal def  Impr/plan Septic shock UT source Ecoli bacteremia  Lactic acid  Acute on chronic renal failure   Plan Awaiting CVP Cont IVFs Cont CTX for abx Awaiting chem and f/u lactate

## 2023-02-12 NOTE — Procedures (Signed)
Central Venous Catheter Insertion Procedure Note  Valerie [name]  [id number]  08/07/1960  Date:02/12/23  Time:2:54 PM   Provider Performing:Pete Bea Laura Tanja Port   Procedure: Insertion of Non-tunneled Central Venous [phone]) with US guidance ([id number])   Indication(s) Medication administration and Difficult access  Consent Risks of the procedure as well as the alternatives and risks of each were explained to the patient and/or caregiver.  Consent for the procedure was obtained and is signed in the bedside chart  Anesthesia Topical only with 1% lidocaine   Timeout Verified patient identification, verified procedure, site/side was marked, verified correct patient position, special equipment/implants available, medications/allergies/relevant history reviewed, required imaging and test results available.  Sterile Technique Maximal sterile technique including full sterile barrier drape, hand hygiene, sterile gown, sterile gloves, mask, hair covering, sterile ultrasound probe cover (if used).  Procedure Description Area of catheter insertion was cleaned with chlorhexidine and draped in sterile fashion.  With real-time ultrasound guidance a central venous catheter was placed into the right internal jugular vein. Nonpulsatile blood flow and easy flushing noted in all ports.  The catheter was sutured in place and sterile dressing applied.  Complications/Tolerance None; patient tolerated the procedure well. Chest X-ray is ordered to verify placement for internal jugular or subclavian cannulation.   Chest x-ray is not ordered for femoral cannulation.  EBL Minimal  Specimen(s) None   Valerie [name] ACNP-BC [facility] Pulmonary/Critical Care Pager # [phone] OR # [phone] if no answer

## 2023-02-13 DIAGNOSIS — E876 Hypokalemia: Secondary | ICD-10-CM | POA: Diagnosis not present

## 2023-02-13 DIAGNOSIS — N10 Acute tubulo-interstitial nephritis: Secondary | ICD-10-CM | POA: Diagnosis not present

## 2023-02-13 DIAGNOSIS — N132 Hydronephrosis with renal and ureteral calculous obstruction: Secondary | ICD-10-CM | POA: Diagnosis not present

## 2023-02-13 DIAGNOSIS — A419 Sepsis, unspecified organism: Secondary | ICD-10-CM | POA: Diagnosis not present

## 2023-02-13 DIAGNOSIS — E871 Hypo-osmolality and hyponatremia: Secondary | ICD-10-CM | POA: Diagnosis not present

## 2023-02-13 LAB — BASIC METABOLIC PANEL
Anion gap: 4 — ABNORMAL LOW (ref 5–15)
BUN: 18 mg/dL (ref 8–23)
CO2: 22 mmol/L (ref 22–32)
Calcium: 7.7 mg/dL — ABNORMAL LOW (ref 8.9–10.3)
Chloride: 109 mmol/L (ref 98–111)
Creatinine, Ser: 1.04 mg/dL — ABNORMAL HIGH (ref 0.44–1.00)
GFR, Estimated: >60 mL/min (ref >60)
Glucose, Bld: 100 mg/dL — ABNORMAL HIGH (ref 70–99)
Potassium: 4.4 mmol/L (ref 3.5–5.1)
Sodium: 135 mmol/L (ref 135–145)

## 2023-02-13 LAB — CBC
HCT: 30.4 % — ABNORMAL LOW (ref 36.0–46.0)
Hemoglobin: 9.7 g/dL — ABNORMAL LOW (ref 12.0–15.0)
MCH: 29.7 pg (ref 26.0–34.0)
MCHC: 31.9 g/dL (ref 30.0–36.0)
MCV: 93 fL (ref 80.0–100.0)
Platelets: 140 10*3/uL — ABNORMAL LOW (ref 150–400)
RBC: 3.27 MIL/uL — ABNORMAL LOW (ref 3.87–5.11)
RDW: 15.4 % (ref 11.5–15.5)
WBC: 27.5 10*3/uL — ABNORMAL HIGH (ref 4.0–10.5)
nRBC: 0 % (ref 0.0–0.2)

## 2023-02-13 LAB — LACTIC ACID, PLASMA: Lactic Acid, Venous: 1.3 mmol/L (ref 0.5–1.9)

## 2023-02-13 LAB — URINE CULTURE: Culture: NO GROWTH

## 2023-02-13 MED ORDER — QUETIAPINE FUMARATE 50 MG PO TABS
100.0000 mg | ORAL_TABLET | Freq: Every day | ORAL | Status: DC
  Administered 2023-02-13 – 2023-02-15 (×3): 100 mg via ORAL
  Filled 2023-02-13 (×3): qty 1

## 2023-02-13 MED ORDER — DOCUSATE SODIUM 100 MG PO CAPS
100.0000 mg | ORAL_CAPSULE | Freq: Every day | ORAL | Status: DC
  Administered 2023-02-13 – 2023-02-16 (×4): 100 mg via ORAL
  Filled 2023-02-13 (×4): qty 1

## 2023-02-13 MED ORDER — LINACLOTIDE 145 MCG PO CAPS
290.0000 ug | ORAL_CAPSULE | Freq: Every day | ORAL | Status: DC
  Administered 2023-02-14 – 2023-02-16 (×3): 290 ug via ORAL
  Filled 2023-02-13 (×4): qty 2

## 2023-02-13 MED ORDER — SENNOSIDES-DOCUSATE SODIUM 8.6-50 MG PO TABS
4.0000 | ORAL_TABLET | Freq: Every day | ORAL | Status: DC
  Administered 2023-02-13: 4 via ORAL
  Filled 2023-02-13: qty 4

## 2023-02-13 MED ORDER — TEMAZEPAM 15 MG PO CAPS
15.0000 mg | ORAL_CAPSULE | Freq: Every evening | ORAL | Status: DC | PRN
  Administered 2023-02-13: 15 mg via ORAL
  Filled 2023-02-13: qty 1

## 2023-02-13 MED ORDER — ALPRAZOLAM 0.5 MG PO TABS
0.5000 mg | ORAL_TABLET | Freq: Two times a day (BID) | ORAL | Status: DC | PRN
  Administered 2023-02-13 (×2): 0.5 mg via ORAL
  Filled 2023-02-13 (×2): qty 1

## 2023-02-13 MED ORDER — PANTOPRAZOLE SODIUM 40 MG PO TBEC
40.0000 mg | DELAYED_RELEASE_TABLET | Freq: Every day | ORAL | Status: DC
  Administered 2023-02-13 – 2023-02-16 (×4): 40 mg via ORAL
  Filled 2023-02-13 (×4): qty 1

## 2023-02-13 MED ORDER — NOREPINEPHRINE 4 MG/250ML-% IV SOLN: 0.0000 ug/min | INTRAVENOUS | Status: DC
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
## 2023-02-13 NOTE — Plan of Care (Signed)
  Problem: Health Behavior/Discharge Planning: Goal: Ability to manage health-related needs will improve Outcome: Progressing   Problem: Clinical Measurements: Goal: Ability to maintain clinical measurements within normal limits will improve Outcome: Progressing Goal: Will remain free from infection Outcome: Progressing Goal: Diagnostic test results will improve Outcome: Progressing Goal: Respiratory complications will improve Outcome: Progressing Goal: Cardiovascular complication will be avoided Outcome: Progressing   Problem: Nutrition: Goal: Adequate nutrition will be maintained Outcome: Progressing   Problem: Elimination: Goal: Will not experience complications related to urinary retention Outcome: Progressing   Problem: Pain Management: Goal: General experience of comfort will improve Outcome: Progressing

## 2023-02-13 NOTE — Plan of Care (Signed)

## 2023-02-13 NOTE — Consult Note (Signed)
NAME:  Valerie [name], MRN:  [id number], DOB:  May 17, 1960, LOS: 2 ADMISSION DATE:  02/11/2023, CONSULTATION DATE:  02/12/23 REFERRING MD:  Gery Pray, MD CHIEF COMPLAINT:  septic shock   History of Present Illness:  Valerie [name] is a 62 year old woman with history of hypothyroidism and nephrolithiasis who was admitted 12/12 for bilateraly lower back pain with nausea and vomiting found to have 13 mm left UPJ junction stone with mild left hydronephrosis and superimposed pyelonephritis s/p cystoscopy with left ureteral stent placement by urology. Post procedure patient developed hypotension. She was given fluid resuscitation without improvement in her BP and started on peripheral levophed. She was transferred to the ICU.   PCCM consulted to take over management due to septic shock.  Pertinent  Medical History  Hx of small bowel obstruction  Significant Hospital Events: Including procedures, antibiotic start and stop dates in addition to other pertinent events   12/12 admitted for pyelonephritis s/p cystoscopy with left ureteral stent placement 12/13 transferred to ICU for septic shock on levophed  Interim History / Subjective:   Improved flank pain. Has an appetite this morning  Objective   Blood pressure 116/70, pulse 77, temperature 97.6 F (36.4 C), temperature source Oral, resp. rate 17, height 5' (1.524 m), weight 49.4 kg, SpO2 93%. CVP:  [0 mmHg-12 mmHg] 4 mmHg      Intake/Output Summary (Last 24 hours) at 02/13/2023 0736 Last data filed at 02/13/2023 0654 Gross per 24 hour  Intake 2865.94 ml  Output 1265 ml  Net 1600.94 ml   Filed Weights   02/11/23 1257  Weight: 49.4 kg    Physical Exam: General: Well-appearing, no acute distress HENT: [place], AT, OP clear, MMM Eyes: EOMI, no scleral icterus Respiratory: Clear to auscultation bilaterally.  No crackles, wheezing or rales Cardiovascular: RRR, -M/R/G, no JVD GI: BS+, soft, mild flank tenderness  bilaterally Extremities:-Edema,-tenderness Neuro: AAO x4, CNII-XII grossly intact Psych: Normal mood, normal affect GU: Foley in place  Imaging, labs and test in EMR in the last 24 hours reviewed independently by me. Pertinent findings below: LA resolved WBC increased 27  Resolved Hospital Problem list     Assessment & Plan:   Septic Shock due to Pyelonephritis in setting of obstructing left UPJ stone s/p L ureteral stent 02/11/2023  GNR bacteremia - s/p left ureteral stent placement by urology. Continue foley until afebrile > 24 hours - Wean levophed support for MAP goal 65 or greater - Continue ceftriaxone. F/u culture data - follow up cultures - continue cefepime, discontinue vancomycin.  Acute Kidney Injury - improving Lactic Acidosis - resolved Hypokalemia - resolved Mild hyponatremia - in setting above - trend UOP/Cr - S/p fluid resuscitation - replete electrolytes PRN  Thrombocytopenia Anemia - in setting of sepsis - monitor  Hypothyroidism - continue home synthroid  Depression - on seroquel and prozac  Hx of prolonged QT - monitor. Today <537ms  IBS - Restarted home linzess, senokot, colace  Best Practice (right click and "Reselect all SmartList Selections" daily)   Diet/type: Regular consistency (see orders) DVT prophylaxis SCD Pressure ulcer(s): N/A GI prophylaxis: PPI Home med Lines: Central line Foley:  Yes, and it is still needed Code Status:  full code Last date of multidisciplinary goals of care discussion [12/13 patient is full code]  Critical care time: 35 minutes    The patient is critically ill with multiple organ systems failure and requires high complexity decision making for assessment and support, frequent evaluation and titration of therapies, application  of advanced monitoring technologies and extensive interpretation of multiple databases.    Mechele Collin, M.D. [facility] Pulmonary/Critical Care Medicine 02/13/2023 7:36 AM    Please see Amion for pager number to reach on-call Pulmonary and Critical Care Team.

## 2023-02-13 NOTE — Progress Notes (Signed)
eLink Physician-Brief Progress Note Patient Name: Valerie [name] DOB: Jul 25, 1960 MRN: [id number]   Date of Service  02/13/2023  HPI/Events of Note  62 year old female initially presented with septic shock secondary to pyelonephritis in the setting of obstructive left UPJ stone status post left ureteral stent placement complicated by gram-negative rod bacteremia and acute kidney injury.  Borderline blood pressures of 99/61 with a MAP of 72.  CVP monitoring shows measurement of 3.  eICU Interventions  MAP is greater than 65 and no significant tachycardia, no intervention currently indicated.  If the patient's MAP drops or persistent tachycardia greater than 110, will address with a fluid bolus.     Intervention Category Intermediate Interventions: Hypotension - evaluation and management  Verlene Glantz 02/13/2023, 8:59 PM

## 2023-02-14 DIAGNOSIS — N179 Acute kidney failure, unspecified: Secondary | ICD-10-CM | POA: Diagnosis not present

## 2023-02-14 DIAGNOSIS — R6521 Severe sepsis with septic shock: Secondary | ICD-10-CM

## 2023-02-14 DIAGNOSIS — B9620 Unspecified Escherichia coli [E. coli] as the cause of diseases classified elsewhere: Secondary | ICD-10-CM

## 2023-02-14 DIAGNOSIS — N10 Acute tubulo-interstitial nephritis: Secondary | ICD-10-CM | POA: Diagnosis not present

## 2023-02-14 DIAGNOSIS — A419 Sepsis, unspecified organism: Secondary | ICD-10-CM | POA: Diagnosis not present

## 2023-02-14 LAB — CULTURE, BLOOD (ROUTINE X 2)

## 2023-02-14 LAB — BASIC METABOLIC PANEL
Anion gap: 4 — ABNORMAL LOW (ref 5–15)
BUN: 14 mg/dL (ref 8–23)
CO2: 23 mmol/L (ref 22–32)
Calcium: 7.8 mg/dL — ABNORMAL LOW (ref 8.9–10.3)
Chloride: 110 mmol/L (ref 98–111)
Creatinine, Ser: 1.01 mg/dL — ABNORMAL HIGH (ref 0.44–1.00)
GFR, Estimated: >60 mL/min (ref >60)
Glucose, Bld: 78 mg/dL (ref 70–99)
Potassium: 3.8 mmol/L (ref 3.5–5.1)
Sodium: 137 mmol/L (ref 135–145)

## 2023-02-14 LAB — CBC
HCT: 27.5 % — ABNORMAL LOW (ref 36.0–46.0)
Hemoglobin: 8.8 g/dL — ABNORMAL LOW (ref 12.0–15.0)
MCH: 29.7 pg (ref 26.0–34.0)
MCHC: 32 g/dL (ref 30.0–36.0)
MCV: 92.9 fL (ref 80.0–100.0)
Platelets: 116 10*3/uL — ABNORMAL LOW (ref 150–400)
RBC: 2.96 MIL/uL — ABNORMAL LOW (ref 3.87–5.11)
RDW: 15.4 % (ref 11.5–15.5)
WBC: 10.3 10*3/uL (ref 4.0–10.5)
nRBC: 0 % (ref 0.0–0.2)

## 2023-02-14 LAB — MAGNESIUM: Magnesium: 1.9 mg/dL (ref 1.7–2.4)

## 2023-02-14 MED ORDER — ALPRAZOLAM 0.5 MG PO TABS
0.5000 mg | ORAL_TABLET | Freq: Two times a day (BID) | ORAL | Status: DC
  Administered 2023-02-14 – 2023-02-16 (×5): 0.5 mg via ORAL
  Filled 2023-02-14 (×5): qty 1

## 2023-02-14 MED ORDER — MAGNESIUM SULFATE IN D5W 1-5 GM/100ML-% IV SOLN
1.0000 g | Freq: Once | INTRAVENOUS | Status: AC
  Administered 2023-02-14: 1 g via INTRAVENOUS
  Filled 2023-02-14: qty 100

## 2023-02-14 MED ORDER — FLUOXETINE HCL 20 MG PO CAPS
40.0000 mg | ORAL_CAPSULE | Freq: Two times a day (BID) | ORAL | Status: DC
Start: 2023-02-14 — End: 2023-02-16
  Administered 2023-02-14 – 2023-02-16 (×5): 40 mg via ORAL
  Filled 2023-02-14 (×5): qty 2

## 2023-02-14 MED ORDER — POTASSIUM CHLORIDE CRYS ER 20 MEQ PO TBCR
20.0000 meq | EXTENDED_RELEASE_TABLET | Freq: Once | ORAL | Status: AC
  Administered 2023-02-14: 20 meq via ORAL
  Filled 2023-02-14: qty 1

## 2023-02-14 MED ORDER — FOLIC ACID 1 MG PO TABS
1.0000 mg | ORAL_TABLET | Freq: Every day | ORAL | Status: DC
Start: 2023-02-14 — End: 2023-02-16
  Administered 2023-02-14 – 2023-02-16 (×3): 1 mg via ORAL
  Filled 2023-02-14 (×3): qty 1

## 2023-02-14 MED ORDER — SENNA 8.6 MG PO TABS
4.0000 | ORAL_TABLET | Freq: Every day | ORAL | Status: DC
Start: 2023-02-14 — End: 2023-02-16
  Administered 2023-02-14 – 2023-02-16 (×3): 34.4 mg via ORAL
  Filled 2023-02-14 (×3): qty 4

## 2023-02-14 MED ORDER — LEVOTHYROXINE SODIUM 75 MCG PO TABS: 37.5000 ug | ORAL_TABLET | Freq: Every day | ORAL | Status: DC

## 2023-02-14 MED ORDER — LEVOTHYROXINE SODIUM 75 MCG PO TABS
75.0000 ug | ORAL_TABLET | ORAL | Status: DC
  Administered 2023-02-15 – 2023-02-16 (×2): 75 ug via ORAL
  Filled 2023-02-14 (×2): qty 1

## 2023-02-14 MED ORDER — TEMAZEPAM 15 MG PO CAPS
30.0000 mg | ORAL_CAPSULE | Freq: Every day | ORAL | Status: DC
  Administered 2023-02-14 – 2023-02-15 (×2): 30 mg via ORAL
  Filled 2023-02-14 (×2): qty 2

## 2023-02-14 MED ORDER — LEVOTHYROXINE SODIUM 75 MCG PO TABS
37.5000 ug | ORAL_TABLET | ORAL | Status: DC
  Administered 2023-02-14: 37.5 ug via ORAL
  Filled 2023-02-14: qty 1

## 2023-02-14 NOTE — Plan of Care (Signed)
  Problem: Health Behavior/Discharge Planning: Goal: Ability to manage health-related needs will improve Outcome: Progressing   Problem: Clinical Measurements: Goal: Diagnostic test results will improve Outcome: Progressing Goal: Respiratory complications will improve Outcome: Progressing Goal: Cardiovascular complication will be avoided Outcome: Progressing Note: BP systolic maintained about 100 today.   Problem: Nutrition: Goal: Adequate nutrition will be maintained Outcome: Progressing   Problem: Elimination: Goal: Will not experience complications related to urinary retention Outcome: Progressing Note: Foley removed and had adequate urine output 4 hours later.

## 2023-02-14 NOTE — Progress Notes (Addendum)
PROGRESS NOTE   Valerie [name]  ION:[id number]    DOB: 1960-05-31    DOA: 02/11/2023  PCP: [name], MD   I have briefly reviewed patients previous medical records in [facility].  Chief Complaint  Patient presents with   Flank Pain   Nausea    Brief Hospital Course:  62 year old married female, independent, medical history significant for anxiety and depression, IBS, prolonged QT, nephrolithiasis, postop hypothyroidism, sleeve gastrectomy followed by placement of Linx device for reflux, SBO admitted on 02/11/2023 for bilateral lower back pain with nausea and vomiting.  Found to have 13 mm left UPJ stone with mild left hydronephrosis and superimposed pyelonephritis.  S/p cystoscopy with left ureteral stent placement by urology on 02/11/2023.  Postprocedure, patient developed septic shock due to pyelonephritis in the setting of obstructing left UPJ stone and E. coli bacteremia, requiring ICU transfer under CCM care, vasopressors.  Vasopressors discontinued 12/14 and care transferred to [facility] 12/15.   Assessment & Plan:  Principal Problem:   Acute pyelonephritis Active Problems:   Hypothyroidism   QT prolongation   Nephrolithiasis   AKI (acute kidney injury) (HCC)   Septic Shock due to Pyelonephritis in setting of obstructing left UPJ stone s/p L ureteral stent 02/11/2023  E. coli bacteremia - s/p left ureteral stent placement by urology on 12/12.  -Postprocedure, developed septic shock, ICU transfer under CCM, required vasopressors.  Vasopressors discontinued around noon on 12/14.  Ongoing soft blood pressures overnight with several SBP's in the 80-90 range.  Continue to monitor closely in SDU, right IJ central line to remain in place.  Pending improved BP, will consider transfer out of SDU. -Still with Foley catheter, order placed to discontinue. -Regular urine culture and urine culture at cystoscopy both negative - BCID: E. coli.  Blood cultures x 2 with E. coli,  sensitivities pending (1 set of blood culture still reads gram-negative rods but is likely E. coli). -Cefepime and vancomycin x 1 on 12/12, ceftriaxone 12/12 >, continue ceftriaxone pending final culture sensitivities. -Multimodality pain control.  Still requiring IV Dilaudid. -Overall improving, pain better, defervesced more than 48 hours, leukocytosis resolved. -Urology following and will arrange outpatient follow-up.  Acute Kidney Injury -resolved.  Presented with creatinine of 1.38.  Down to 1.01. Lactic Acidosis - resolved Hypokalemia - resolved Mild hyponatremia-resolved.   Thrombocytopenia Anemia - in setting of sepsis, acute illness and hemodilution. - monitor -Hemoglobin has gradually drifted down from 13.5 on admission to 8.8 in the absence of overt bleeding. -Thrombocytopenia relatively stable.  No bleeding reported.   Hypothyroidism - continue home synthroid reordered as per home regimen   Depression/anxiety - on seroquel, Prozac takes 40 mg twice daily, takes Xanax 0.5 twice daily and not as needed.  Also on Restoril 30 mg at bedtime.   Hx of prolonged QT -EKG 12/12: QTc of 537 ms.  Repeat EKG today. -Avoid QT prolonging medications. -Keep K >4 and magnesium >2. -Continue telemetry.   IBS/GERD - Restarted home linzess, senokot, colace, Protonix  Body mass index is 21.29 kg/m.   DVT prophylaxis: Place and maintain sequential compression device Start: 02/12/23 0634     Code Status: Full Code:  Family Communication: None at bedside Disposition:  Status is: Inpatient Remains inpatient appropriate because: Ongoing soft blood pressures, requires close monitoring, IV antibiotics pending final culture sensitivities.  Eventual DC home possibly in the next 48 to 72 hours.     Consultants:   Urology PCCM-signed off  Procedures:   Cystoscopy, left ureteral  stent placement 12/12  Antimicrobials:   Cefepime and vancomycin x 1 on 12/12, ceftriaxone 12/12 >,  continue ceftriaxone pending final culture sensitivities.  Subjective:  Left flank pain, rates it at 6/10 in severity, better compared to yesterday.  Tolerating diet.  No BM for the last 3-4 days.  Wants to get out of bed.  Objective:   Vitals:   02/14/23 0530 02/14/23 0600 02/14/23 0630 02/14/23 0700  BP: (!) 100/53 (!) 102/54 109/62 (!) 103/59  Pulse: 88 89 89 91  Resp: (!) 26 (!) 27 (!) 31 (!) 30  Temp:      TempSrc:      SpO2: 91% 91% 93% 94%  Weight:      Height:        General exam: Middle-age female, moderately built and frail, lying comfortably propped up in bed without distress. Neck: Right IJ CVC without acute findings. Respiratory system: Clear to auscultation. Respiratory effort normal. Cardiovascular system: S1 & S2 heard, RRR. No JVD, murmurs, rubs, gallops or clicks. No pedal edema.  Telemetry personally reviewed: Sinus rhythm. Gastrointestinal system: Abdomen is nondistended, soft and nontender. No organomegaly or masses felt. Normal bowel sounds heard. GU: Still has indwelling Foley catheter.  Mild left renal angle tenderness. Central nervous system: Alert and oriented. No focal neurological deficits. Extremities: Symmetric 5 x 5 power. Skin: No rashes, lesions or ulcers Psychiatry: Judgement and insight appear normal. Mood & affect appropriate.     Data Reviewed:   I have personally reviewed following labs and imaging studies   CBC: Recent Labs  Lab 02/11/23 1536 02/12/23 0101 02/13/23 0439 02/14/23 0625  WBC 22.8* 18.6* 27.5* 10.3  NEUTROABS 20.7* 17.3*  --   --   HGB 13.5 10.2* 9.7* 8.8*  HCT 40.3 30.7* 30.4* 27.5*  MCV 90.0 90.3 93.0 92.9  PLT 182 106* 140* 116*    Basic Metabolic Panel: Recent Labs  Lab 02/11/23 1536 02/11/23 2021 02/12/23 0101 02/12/23 1703 02/13/23 0439 02/14/23 0625  NA 136  --  132* 132* 135 137  K 4.1  --  3.1* 4.4 4.4 3.8  CL 107  --  106 107 109 110  CO2 18*  --  20* 20* 22 23  GLUCOSE 145*  --  123* 127*  100* 78  BUN 16  --  17 17 18 14   CREATININE 1.38*  --  1.29* 0.99 1.04* 1.01*  CALCIUM 9.0  --  7.6* 7.5* 7.7* 7.8*  MG  --  1.7 1.7  --   --  1.9    Liver Function Tests: Recent Labs  Lab 02/11/23 1536  AST 34  ALT 28  ALKPHOS 79  BILITOT 1.8*  PROT 6.7  ALBUMIN 3.5    CBG: Recent Labs  Lab 02/12/23 1556  GLUCAP 104*    Microbiology Studies:   Recent Results (from the past 240 hours)  Resp panel by RT-PCR (RSV, Flu A&B, Covid) Anterior Nasal Swab     Status: None   Collection Time: 02/11/23  8:21 PM   Specimen: Anterior Nasal Swab  Result Value Ref Range Status   SARS Coronavirus 2 by RT PCR NEGATIVE NEGATIVE Final    Comment: (NOTE) SARS-CoV-2 target nucleic acids are NOT DETECTED.  The SARS-CoV-2 RNA is generally detectable in upper respiratory specimens during the acute phase of infection. The lowest concentration of SARS-CoV-2 viral copies this assay can detect is 138 copies/mL. A negative result does not preclude SARS-Cov-2 infection and should not be used as the sole  basis for treatment or other patient management decisions. A negative result may occur with  improper specimen collection/handling, submission of specimen other than nasopharyngeal swab, presence of viral mutation(s) within the areas targeted by this assay, and inadequate number of viral copies(<138 copies/mL). A negative result must be combined with clinical observations, patient history, and epidemiological information. The expected result is Negative.  Fact Sheet for Patients:  BloggerCourse.com  Fact Sheet for Healthcare Providers:  SeriousBroker.it  This test is no t yet approved or cleared by the [place] FDA and  has been authorized for detection and/or diagnosis of SARS-CoV-2 by FDA under an Emergency Use Authorization (EUA). This EUA will remain  in effect (meaning this test can be used) for the duration of the COVID-19  declaration under Section 564(b)(1) of the Act, 21 U.S.C.section 360bbb-3(b)(1), unless the authorization is terminated  or revoked sooner.       Influenza A by PCR NEGATIVE NEGATIVE Final   Influenza B by PCR NEGATIVE NEGATIVE Final    Comment: (NOTE) The Xpert Xpress SARS-CoV-2/FLU/RSV plus assay is intended as an aid in the diagnosis of influenza from Nasopharyngeal swab specimens and should not be used as a sole basis for treatment. Nasal washings and aspirates are unacceptable for Xpert Xpress SARS-CoV-2/FLU/RSV testing.  Fact Sheet for Patients: BloggerCourse.com  Fact Sheet for Healthcare Providers: SeriousBroker.it  This test is not yet approved or cleared by the [place] FDA and has been authorized for detection and/or diagnosis of SARS-CoV-2 by FDA under an Emergency Use Authorization (EUA). This EUA will remain in effect (meaning this test can be used) for the duration of the COVID-19 declaration under Section 564(b)(1) of the Act, 21 U.S.C. section 360bbb-3(b)(1), unless the authorization is terminated or revoked.     Resp Syncytial Virus by PCR NEGATIVE NEGATIVE Final    Comment: (NOTE) Fact Sheet for Patients: BloggerCourse.com  Fact Sheet for Healthcare Providers: SeriousBroker.it  This test is not yet approved or cleared by the [place] FDA and has been authorized for detection and/or diagnosis of SARS-CoV-2 by FDA under an Emergency Use Authorization (EUA). This EUA will remain in effect (meaning this test can be used) for the duration of the COVID-19 declaration under Section 564(b)(1) of the Act, 21 U.S.C. section 360bbb-3(b)(1), unless the authorization is terminated or revoked.  Performed at [facility], [address], [place], [place] [address]   Blood Culture (routine x 2)     Status: Abnormal (Preliminary result)    Collection Time: 02/11/23  8:21 PM   Specimen: BLOOD  Result Value Ref Range Status   Specimen Description   Final    BLOOD RIGHT ANTECUBITAL Performed at [facility], [address], [place], [place] [address]    Special Requests   Final    BOTTLES DRAWN AEROBIC AND ANAEROBIC Blood Culture results may not be optimal due to an inadequate volume of blood received in culture bottles Performed at [facility], [address] [place], [place] [address]    Culture  Setup Time   Final    GRAM NEGATIVE RODS IN BOTH AEROBIC AND ANAEROBIC BOTTLES CRITICAL RESULT CALLED TO, READ BACK BY AND VERIFIED WITH: PHARMD J.LEGGE AT 1053 ON 02/12/2023 BY T.SAAD.    Culture (A)  Final    ESCHERICHIA COLI SUSCEPTIBILITIES TO FOLLOW Performed at [facility], [address], [place], [place] [address]    Report Status PENDING  Incomplete  Blood Culture (routine x 2)  Status: None (Preliminary result)   Collection Time: 02/11/23  8:21 PM   Specimen: BLOOD  Result Value Ref Range Status   Specimen Description   Final    BLOOD BLOOD RIGHT FOREARM Performed at [facility], [address], [place], [place] [address]    Special Requests   Final    BOTTLES DRAWN AEROBIC AND ANAEROBIC Blood Culture results may not be optimal due to an inadequate volume of blood received in culture bottles Performed at [facility], [address] [place], [place] [address]    Culture  Setup Time   Final    GRAM NEGATIVE RODS IN BOTH AEROBIC AND ANAEROBIC BOTTLES CRITICAL VALUE NOTED.  VALUE IS CONSISTENT WITH PREVIOUSLY REPORTED AND CALLED VALUE.    Culture   Final    GRAM NEGATIVE RODS IDENTIFICATION TO FOLLOW Performed at [facility], [address], [place], [place] [address]    Report Status PENDING  Incomplete  Blood Culture ID Panel (Reflexed)     Status: Abnormal   Collection Time: 02/11/23  8:21 PM  Result Value  Ref Range Status   Enterococcus faecalis NOT DETECTED NOT DETECTED Final   Enterococcus Faecium NOT DETECTED NOT DETECTED Final   Listeria monocytogenes NOT DETECTED NOT DETECTED Final   Staphylococcus species NOT DETECTED NOT DETECTED Final   Staphylococcus aureus (BCID) NOT DETECTED NOT DETECTED Final   Staphylococcus epidermidis NOT DETECTED NOT DETECTED Final   Staphylococcus lugdunensis NOT DETECTED NOT DETECTED Final   Streptococcus species NOT DETECTED NOT DETECTED Final   Streptococcus agalactiae NOT DETECTED NOT DETECTED Final   Streptococcus pneumoniae NOT DETECTED NOT DETECTED Final   Streptococcus pyogenes NOT DETECTED NOT DETECTED Final   A.calcoaceticus-baumannii NOT DETECTED NOT DETECTED Final   Bacteroides fragilis NOT DETECTED NOT DETECTED Final   Enterobacterales DETECTED (A) NOT DETECTED Final    Comment: Enterobacterales represent a large order of gram negative bacteria, not a single organism. CRITICAL RESULT CALLED TO, READ BACK BY AND VERIFIED WITH: PHARMD J.LEGGE AT 1053 ON 02/12/2023 BY T.SAAD.    Enterobacter cloacae complex NOT DETECTED NOT DETECTED Final   Escherichia coli DETECTED (A) NOT DETECTED Final    Comment: CRITICAL RESULT CALLED TO, READ BACK BY AND VERIFIED WITH: PHARMD J.LEGGE AT 1053 ON 02/12/2023 BY T.SAAD.    Klebsiella aerogenes NOT DETECTED NOT DETECTED Final   Klebsiella oxytoca NOT DETECTED NOT DETECTED Final   Klebsiella pneumoniae NOT DETECTED NOT DETECTED Final   Proteus species NOT DETECTED NOT DETECTED Final   Salmonella species NOT DETECTED NOT DETECTED Final   Serratia marcescens NOT DETECTED NOT DETECTED Final   Haemophilus influenzae NOT DETECTED NOT DETECTED Final   Neisseria meningitidis NOT DETECTED NOT DETECTED Final   Pseudomonas aeruginosa NOT DETECTED NOT DETECTED Final   Stenotrophomonas maltophilia NOT DETECTED NOT DETECTED Final   Candida albicans NOT DETECTED NOT DETECTED Final   Candida auris NOT DETECTED NOT  DETECTED Final   Candida glabrata NOT DETECTED NOT DETECTED Final   Candida krusei NOT DETECTED NOT DETECTED Final   Candida parapsilosis NOT DETECTED NOT DETECTED Final   Candida tropicalis NOT DETECTED NOT DETECTED Final   Cryptococcus neoformans/gattii NOT DETECTED NOT DETECTED Final   CTX-M ESBL NOT DETECTED NOT DETECTED Final   Carbapenem resistance IMP NOT DETECTED NOT DETECTED Final   Carbapenem resistance KPC NOT DETECTED NOT DETECTED Final   Carbapenem resistance NDM NOT DETECTED NOT DETECTED Final   Carbapenem resist OXA 48 LIKE NOT DETECTED  NOT DETECTED Final   Carbapenem resistance VIM NOT DETECTED NOT DETECTED Final    Comment: Performed at [facility], [address], [place], [place] [address]  Urine Culture     Status: None   Collection Time: 02/11/23 11:24 PM   Specimen: Urine, Cystoscope  Result Value Ref Range Status   Specimen Description   Final    URINE, RANDOM Performed at [facility], [address] [place], [place] [address]    Special Requests   Final    CYSTOSCOPY Performed at [facility], [address] [place], [place] [address]    Culture   Final    NO GROWTH Performed at [facility], [address], [place], [place] [address]    Report Status 02/12/2023 FINAL  Final  Urine Culture     Status: None   Collection Time: 02/12/23  1:20 AM   Specimen: Urine, Random  Result Value Ref Range Status   Specimen Description   Final    URINE, RANDOM Performed at [facility], [address], [place], [place] [address]    Special Requests   Final    NONE Reflexed from [id number] Performed at [facility], [address] [place], [place] [address]    Culture   Final    NO GROWTH Performed at [facility], [address], [place], [place] [address]    Report Status 02/13/2023 FINAL  Final  MRSA Next Gen by PCR, Nasal     Status: None   Collection Time:  02/12/23  2:41 AM   Specimen: Nasal Mucosa; Nasal Swab  Result Value Ref Range Status   MRSA by PCR Next Gen NOT DETECTED NOT DETECTED Final    Comment: (NOTE) The GeneXpert MRSA Assay (FDA approved for NASAL specimens only), is one component of a comprehensive MRSA colonization surveillance program. It is not intended to diagnose MRSA infection nor to guide or monitor treatment for MRSA infections. Test performance is not FDA approved in patients less than 51 years old. Performed at [facility], [address], [place], [place] [address]     Radiology Studies:  DG Chest Port 1 View Result Date: 02/12/2023 CLINICAL DATA:  Central line EXAM: PORTABLE CHEST 1 VIEW COMPARISON:  02/11/2023 FINDINGS: Right-sided central venous catheter with tip at the proximal right atrium. No visible right pneumothorax. Streaky atelectasis or minimal infiltrate left base. Possible small pleural effusions. Postsurgical changes at the GE junction. Multiple clips at the thoracic inlet. IMPRESSION: 1. Right-sided central venous catheter with tip at the proximal right atrium. No visible pneumothorax. 2. Streaky atelectasis or minimal infiltrate at the left base. Possible small pleural effusions. Electronically Signed   By: Jasmine Pang M.D.   On: 02/12/2023 17:29    Scheduled Meds:    ALPRAZolam  0.5 mg Oral BID   Chlorhexidine Gluconate Cloth  6 each Topical Daily   docusate sodium  100 mg Oral Daily   FLUoxetine  40 mg Oral BID   folic acid  1 mg Oral Daily   [START ON 02/15/2023] levothyroxine  37.5-75 mcg Oral Q0600   linaclotide  290 mcg Oral QAC breakfast   pantoprazole  40 mg Oral Daily   potassium chloride  20 mEq Oral Once   QUEtiapine  100 mg Oral QHS   senna  4 tablet Oral q AM   temazepam  30 mg Oral QHS    Continuous Infusions:  cefTRIAXone (ROCEPHIN)  IV Stopped (02/13/23 1359)   magnesium sulfate bolus IVPB       LOS: 3 days     Marcellus Scott, MD,  FACP,  [facility], [facility], [facility]   [facility] [place]      To contact the attending provider between 7A-7P or the covering provider during after hours 7P-7A, please log into the web site [email or web address] and access using universal [place] password for that web site. If you do not have the password, please call the hospital operator.  02/14/2023, 8:23 AM

## 2023-02-14 NOTE — Plan of Care (Signed)
  Problem: Health Behavior/Discharge Planning: Goal: Ability to manage health-related needs will improve Outcome: Progressing   Problem: Clinical Measurements: Goal: Ability to maintain clinical measurements within normal limits will improve Outcome: Progressing Goal: Will remain free from infection Outcome: Progressing Goal: Diagnostic test results will improve Outcome: Progressing Goal: Respiratory complications will improve Outcome: Progressing Goal: Cardiovascular complication will be avoided Outcome: Progressing   Problem: Activity: Goal: Risk for activity intolerance will decrease Outcome: Progressing   Problem: Nutrition: Goal: Adequate nutrition will be maintained Outcome: Progressing   Problem: Coping: Goal: Level of anxiety will decrease Outcome: Progressing   Problem: Elimination: Goal: Will not experience complications related to bowel motility Outcome: Progressing Goal: Will not experience complications related to urinary retention Outcome: Progressing   Problem: Pain Management: Goal: General experience of comfort will improve Outcome: Progressing   Problem: Safety: Goal: Ability to remain free from injury will improve Outcome: Progressing

## 2023-02-15 DIAGNOSIS — N10 Acute tubulo-interstitial nephritis: Secondary | ICD-10-CM | POA: Diagnosis not present

## 2023-02-15 LAB — MAGNESIUM: Magnesium: 2 mg/dL (ref 1.7–2.4)

## 2023-02-15 LAB — BASIC METABOLIC PANEL
Anion gap: 3 — ABNORMAL LOW (ref 5–15)
BUN: 11 mg/dL (ref 8–23)
CO2: 24 mmol/L (ref 22–32)
Calcium: 8.2 mg/dL — ABNORMAL LOW (ref 8.9–10.3)
Chloride: 109 mmol/L (ref 98–111)
Creatinine, Ser: 1.02 mg/dL — ABNORMAL HIGH (ref 0.44–1.00)
GFR, Estimated: >60 mL/min (ref >60)
Glucose, Bld: 78 mg/dL (ref 70–99)
Potassium: 3.7 mmol/L (ref 3.5–5.1)
Sodium: 136 mmol/L (ref 135–145)

## 2023-02-15 LAB — CBC
HCT: 28.5 % — ABNORMAL LOW (ref 36.0–46.0)
Hemoglobin: 9.2 g/dL — ABNORMAL LOW (ref 12.0–15.0)
MCH: 30 pg (ref 26.0–34.0)
MCHC: 32.3 g/dL (ref 30.0–36.0)
MCV: 92.8 fL (ref 80.0–100.0)
Platelets: 115 10*3/uL — ABNORMAL LOW (ref 150–400)
RBC: 3.07 MIL/uL — ABNORMAL LOW (ref 3.87–5.11)
RDW: 15 % (ref 11.5–15.5)
WBC: 7.3 10*3/uL (ref 4.0–10.5)
nRBC: 0 % (ref 0.0–0.2)

## 2023-02-15 MED ORDER — CEFAZOLIN SODIUM-DEXTROSE 2-4 GM/100ML-% IV SOLN
2.0000 g | Freq: Three times a day (TID) | INTRAVENOUS | Status: DC
  Administered 2023-02-15 – 2023-02-16 (×3): 2 g via INTRAVENOUS
  Filled 2023-02-15 (×4): qty 100

## 2023-02-15 NOTE — Progress Notes (Addendum)
   4 Days Post-Op Subjective: First time meeting [name].  She was resting comfortably in bed and off of all vasopressors.  Vitals were stable and she was slightly hypertensive.  Reviewed the case and plan and all questions were answered to her satisfaction.  Objective: Vital signs in last 24 hours: Temp:  [97.9 F (36.6 C)-98.7 F (37.1 C)] 98.6 F (37 C) (12/16 0800) Pulse Rate:  [78-92] 84 (12/16 0800) Resp:  [14-34] 18 (12/16 0900) BP: (90-141)/(50-103) 141/103 (12/16 0900) SpO2:  [89 %-99 %] 92 % (12/16 0800)  Assessment/Plan: # Left UPJ stone #Sepsis  S/p left ureteral stent with Dr. Cardell Peach on 12/12 Definitive stone management on an outpatient basis once she has cleared her bacteremia.  Pansensitive E. coli.  Recommend transitioning to oral treatment the day prior to discharge. Likely transferred from ICU today.  Intake/Output from previous day: 12/15 0701 - 12/16 0700 In: 920 [P.O.:720; IV Piggyback:200] Out: 1950 [Urine:1950]  Intake/Output this shift: No intake/output data recorded.  Physical Exam:  General: Alert and oriented CV: No cyanosis Lungs: equal chest rise Abdomen: Soft, NTND, no rebound or guarding Gu: Foley catheter has been removed  Lab Results: Recent Labs    02/13/23 0439 02/14/23 0625 02/15/23 0529  HGB 9.7* 8.8* 9.2*  HCT 30.4* 27.5* 28.5*   BMET Recent Labs    02/14/23 0625 02/15/23 0529  NA 137 136  K 3.8 3.7  CL 110 109  CO2 23 24  GLUCOSE 78 78  BUN 14 11  CREATININE 1.01* 1.02*  CALCIUM 7.8* 8.2*     Studies/Results: No results found.    LOS: 4 days   Elmon Kirschner, NP [facility] Pager: [phone]  02/15/2023, 10:06 AM   I have seen and examined the patient and agree with the above assessment and plan.  Pt improving clinically. Remains afebrile. Voiding without difficulty. Recommend culture specific abx for 2 weeks. Will arrange for outpatient followup and definitive treatment of  stone.  Matt R. Shakia Sebastiano MD [facility]  Pager: [phone]

## 2023-02-15 NOTE — Plan of Care (Signed)
  Problem: Clinical Measurements: Goal: Respiratory complications will improve Outcome: Progressing Goal: Cardiovascular complication will be avoided Outcome: Progressing   Problem: Activity: Goal: Risk for activity intolerance will decrease Outcome: Progressing   Problem: Coping: Goal: Level of anxiety will decrease Outcome: Progressing   Problem: Elimination: Goal: Will not experience complications related to urinary retention Outcome: Progressing   Problem: Pain Management: Goal: General experience of comfort will improve Outcome: Progressing   Problem: Safety: Goal: Ability to remain free from injury will improve Outcome: Progressing

## 2023-02-15 NOTE — Progress Notes (Signed)
[facility] Progress Note  Patient: Valerie [name]     OZH:[id number]  DOA: 02/11/2023   PCP: [name], MD       Brief hospital course: This is a 62 year old female with IBS, postop hypothyroidism, sleeve gastrectomy followed by placement of Linx device, anxiety, depression, prolonged QT, nephrolithiasis and history of SBO who presented to the hospital for lower back pain, nausea and vomiting.  She was found to have left UPJ stone of 13 mm with mild left-sided hydronephrosis and superimposed pyelonephritis on CT scan.  She underwent a cystoscopy with left ureteral stent placement and developed septic shock after the procedure.  She was found to have E. coli bacteremia.  She was treated with vasopressors and IV antibiotics.  She was weaned off of vasopressors on 12/14 and transferred to the [facility] on 12/15.  Subjective:  She states she is still having some left lower abdominal and flank pain.  She states that her urine has a bad smell to it today.  She has no other complaints Assessment and Plan: Principal Problem:   Acute pyelonephritis with obstructing left UPJ stone causing septic shock E. coli bacteremia -12/12-status post left ureteral stent - Foley catheter discontinued she appears to be voiding well -Continues to have left-sided flank pain -Leukocytosis improved from 27.5-7.3 -Antibiotics narrowed to cefazolin today based on sensitivities  Active Problems:  AKI - Secondary to above creatinine 1.38 has improved to about 0.9-1  Acute thrombocytopenia - Possibly secondary to underlying gram-negative infection-follow    QT prolongation -Most recent QTc was 466 and therefore improved  Normocytic anemia - Hemoglobin around 9-10 range  Mild hypokalemia and hyponatremia - Resolved     Will allow for transfer out of the ICU today.  Have encouraged ambulation.    Code Status: Full Code Total time on patient care: 35 minutes DVT prophylaxis:   Place and maintain sequential compression device Start: 02/12/23 0634     Objective:   Vitals:   02/15/23 1300 02/15/23 1400 02/15/23 1500 02/15/23 1600  BP: 125/88 125/70 132/73 129/71  Pulse:      Resp: (!) 21 (!) 25 (!) 25 (!) 21  Temp:      TempSrc:      SpO2:      Weight:      Height:       Filed Weights   02/11/23 1257  Weight: 49.4 kg   Exam: General exam: Appears comfortable  HEENT: oral mucosa moist Respiratory system: Clear to auscultation.  Cardiovascular system: S1 & S2 heard  Gastrointestinal system: Abdomen soft, tender in left lower quadrant, nondistended. Normal bowel sounds   Extremities: No cyanosis, clubbing or edema Psychiatry:  Mood & affect appropriate.      CBC: Recent Labs  Lab 02/11/23 1536 02/12/23 0101 02/13/23 0439 02/14/23 0625 02/15/23 0529  WBC 22.8* 18.6* 27.5* 10.3 7.3  NEUTROABS 20.7* 17.3*  --   --   --   HGB 13.5 10.2* 9.7* 8.8* 9.2*  HCT 40.3 30.7* 30.4* 27.5* 28.5*  MCV 90.0 90.3 93.0 92.9 92.8  PLT 182 106* 140* 116* 115*   Basic Metabolic Panel: Recent Labs  Lab 02/11/23 2021 02/12/23 0101 02/12/23 1703 02/13/23 0439 02/14/23 0625 02/15/23 0529  NA  --  132* 132* 135 137 136  K  --  3.1* 4.4 4.4 3.8 3.7  CL  --  106 107 109 110 109  CO2  --  20* 20* 22 23 24   GLUCOSE  --  123* 127*  100* 78 78  BUN  --  17 17 18 14 11   CREATININE  --  1.29* 0.99 1.04* 1.01* 1.02*  CALCIUM  --  7.6* 7.5* 7.7* 7.8* 8.2*  MG 1.7 1.7  --   --  1.9 2.0     Scheduled Meds:  ALPRAZolam  0.5 mg Oral BID   Chlorhexidine Gluconate Cloth  6 each Topical Daily   docusate sodium  100 mg Oral Daily   FLUoxetine  40 mg Oral BID   folic acid  1 mg Oral Daily   levothyroxine  37.5 mcg Oral Q Sun   levothyroxine  75 mcg Oral Once per day on Monday Tuesday Wednesday Thursday Friday Saturday   linaclotide  290 mcg Oral QAC breakfast   pantoprazole  40 mg Oral Daily   QUEtiapine  100 mg Oral QHS   senna  4 tablet Oral Daily   temazepam   30 mg Oral QHS    Imaging and lab data personally reviewed   Author: Calvert Cantor  02/15/2023 4:25 PM  To contact [facility]>   Check the care team in [facility] and look for the attending/consulting [facility] provider listed  Log into [email or web address] and use [place]'s universal password   Go to> "[facility]"  and find provider  If you still have difficulty reaching the provider, please page the [facility] (Director on Call) for the Hospitalists listed on amion

## 2023-02-15 NOTE — Progress Notes (Signed)
PT Cancellation Note  Patient Details Name: Valerie [name] MRN: [id number] DOB: August 26, 1960   Cancelled Treatment:    Reason Eval/Treat Not Completed: Fatigue/lethargy limiting ability to participate  Patient was resting  when checked on, per RN, patient is ambulatory with supervision. Will check back tomorrow. Blanchard Kelch PT Acute Rehabilitation Services Office [phone] Weekend pager-[phone]  Rada Hay 02/15/2023, 4:52 PM

## 2023-02-16 DIAGNOSIS — N10 Acute tubulo-interstitial nephritis: Secondary | ICD-10-CM | POA: Diagnosis not present

## 2023-02-16 LAB — CBC
HCT: 28.8 % — ABNORMAL LOW (ref 36.0–46.0)
Hemoglobin: 9.1 g/dL — ABNORMAL LOW (ref 12.0–15.0)
MCH: 29.3 pg (ref 26.0–34.0)
MCHC: 31.6 g/dL (ref 30.0–36.0)
MCV: 92.6 fL (ref 80.0–100.0)
Platelets: 138 10*3/uL — ABNORMAL LOW (ref 150–400)
RBC: 3.11 MIL/uL — ABNORMAL LOW (ref 3.87–5.11)
RDW: 14.6 % (ref 11.5–15.5)
WBC: 7.2 10*3/uL (ref 4.0–10.5)
nRBC: 0 % (ref 0.0–0.2)

## 2023-02-16 LAB — BASIC METABOLIC PANEL
Anion gap: 8 (ref 5–15)
BUN: 9 mg/dL (ref 8–23)
CO2: 25 mmol/L (ref 22–32)
Calcium: 8.3 mg/dL — ABNORMAL LOW (ref 8.9–10.3)
Chloride: 104 mmol/L (ref 98–111)
Creatinine, Ser: 1.06 mg/dL — ABNORMAL HIGH (ref 0.44–1.00)
GFR, Estimated: 59 mL/min — ABNORMAL LOW (ref >60)
Glucose, Bld: 91 mg/dL (ref 70–99)
Potassium: 3.3 mmol/L — ABNORMAL LOW (ref 3.5–5.1)
Sodium: 137 mmol/L (ref 135–145)

## 2023-02-16 LAB — MAGNESIUM: Magnesium: 1.8 mg/dL (ref 1.7–2.4)

## 2023-02-16 MED ORDER — SODIUM CHLORIDE 0.9% FLUSH
10.0000 mL | Freq: Two times a day (BID) | INTRAVENOUS | Status: DC
  Administered 2023-02-16: 10 mL

## 2023-02-16 MED ORDER — POTASSIUM CHLORIDE CRYS ER 20 MEQ PO TBCR
40.0000 meq | EXTENDED_RELEASE_TABLET | Freq: Once | ORAL | Status: AC
Start: 2023-02-16 — End: 2023-02-16
  Administered 2023-02-16: 40 meq via ORAL
  Filled 2023-02-16: qty 2

## 2023-02-16 MED ORDER — CEPHALEXIN 500 MG PO CAPS: 500.0000 mg | ORAL_CAPSULE | Freq: Two times a day (BID) | ORAL | 0 refills | Status: AC

## 2023-02-16 MED ORDER — SODIUM CHLORIDE 0.9% FLUSH: 10.0000 mL | INTRAVENOUS | Status: DC | PRN

## 2023-02-16 NOTE — TOC Transition Note (Signed)
Transition of Care [facility]) - Discharge Note   Patient Details  Name: Valerie [name] MRN: [id number] Date of Birth: 1960/11/17  [facility] of [facility]) CM/SW Contact:  Darleene Cleaver, LCSW Phone Number: 02/16/2023, 11:11 AM   Clinical Narrative:     Patient discharging back home.  No TOC needs.  TOC signing off please reconsult if TOC needs arise.  Final next level of care: Home/Self Care Barriers to Discharge: Barriers Resolved   Patient Goals and CMS Choice Patient states their goals for this hospitalization and ongoing recovery are:: To return back home with spouse. CMS Medicare.gov Compare Post Acute Care list provided to:: Patient   [place] ownership interest in [facility].provided to:: Patient    Discharge Placement                       Discharge Plan and Services Additional resources added to the After Visit Summary for     Discharge Planning Services: CM Consult                                 Social Drivers of Health (SDOH) Interventions SDOH Screenings   Food Insecurity: No Food Insecurity (02/12/2023)  Housing: Low Risk  (02/12/2023)  Transportation Needs: No Transportation Needs (02/12/2023)  Utilities: Not At Risk (02/12/2023)  Depression (PHQ2-9): Low Risk  (10/13/2021)  Tobacco Use: Low Risk  (02/12/2023)     Readmission Risk Interventions    02/12/2023   11:55 AM  Readmission Risk Prevention Plan  Transportation Screening Complete  PCP or Specialist Appt within 5-7 Days Complete  Home Care Screening Complete  Medication Review (RN CM) Complete

## 2023-02-16 NOTE — Progress Notes (Signed)
Patient was given discharge instructions, and all questions were answered.  Patient was stable for discharge and was taken to the main exit by wheelchair. 

## 2023-02-16 NOTE — Plan of Care (Signed)
?  Problem: Clinical Measurements: ?Goal: Will remain free from infection ?Outcome: Progressing ?  ?

## 2023-02-16 NOTE — Care Management Important Message (Signed)
Important Message  Patient Details IM Letter given. Name: Valerie [name] MRN: [id number] Date of Birth: 1960/08/25   Important Message Given:  Yes - Medicare IM     Caren Macadam 02/16/2023, 11:21 AM
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
## 2023-02-16 NOTE — Evaluation (Signed)
Physical Therapy Evaluation Patient Details Name: Valerie [name] MRN: [id number] DOB: 1960-07-12 Today's Date: 02/16/2023  History of Present Illness  Pt is 62 yo female admitted on 02/11/23 for acute pyelonephritis with obstructing L UPJ stone causing septic shock, s/p L ureteral stent 12/12.  Pt with hx including but not limited to  IBS, postop hypothyroidism, sleeve gastrectomy followed by placement of Linx device, anxiety, depression, prolonged QT, nephrolithiasis and history of SBO  Clinical Impression  Pt admitted with above diagnosis.  She has been ambulating independently.  Pt demonstrated mobility safely and flight of stairs safely and without difficulty.  VSS and tolerance normal.  Pt with no further questions.  No PT needs will sign off.         If plan is discharge home, recommend the following:     Can travel by private vehicle        Equipment Recommendations None recommended by PT  Recommendations for Other Services       Functional Status Assessment Patient has not had a recent decline in their functional status     Precautions / Restrictions Precautions Precautions: None      Mobility  Bed Mobility Overal bed mobility: Independent                  Transfers Overall transfer level: Independent                 General transfer comment: Demonstrated transfers and STS safely    Ambulation/Gait Ambulation/Gait assistance: Independent Gait Distance (Feet): 200 Feet Assistive device: None Gait Pattern/deviations: WFL(Within Functional Limits)       General Gait Details: Has been ambulating independently; demonstrated safely  Stairs Stairs: Yes Stairs assistance: Supervision Stair Management: No rails, Forwards, Alternating pattern Number of Stairs: 12 General stair comments: perforemd without difficulty  Wheelchair Mobility     Tilt Bed    Modified Rankin (Stroke Patients Only)       Balance Overall balance assessment:  Independent                                           Pertinent Vitals/Pain Pain Assessment Pain Assessment: 0-10 Pain Score: 7  Pain Location: back/flank Pain Descriptors / Indicators: Discomfort Pain Intervention(s): Limited activity within patient's tolerance, Monitored during session, Repositioned    Home Living Family/patient expects to be discharged to:: Private residence Living Arrangements: Spouse/significant other Available Help at Discharge: Family;Available 24 hours/day Type of Home: Apartment Home Access: Stairs to enter Entrance Stairs-Rails: Can reach both;Left;Right Entrance Stairs-Number of Steps: flight (17)   Home Layout: One level Home Equipment: None      Prior Function Prior Level of Function : Independent/Modified Independent;Driving             Mobility Comments: ambulates in community without AD; no falls       Extremity/Trunk Assessment   Upper Extremity Assessment Upper Extremity Assessment: Overall WFL for tasks assessed    Lower Extremity Assessment Lower Extremity Assessment: Overall WFL for tasks assessed    Cervical / Trunk Assessment Cervical / Trunk Assessment: Normal  Communication      Cognition Arousal: Alert Behavior During Therapy: WFL for tasks assessed/performed Overall Cognitive Status: Within Functional Limits for tasks assessed  General Comments General comments (skin integrity, edema, etc.): Pt reports back/flank pain from kidneys. Educated on maintaining mobility, ROM, and stretching as needed to prevent stiffness/debility    Exercises     Assessment/Plan    PT Assessment Patient does not need any further PT services  PT Problem List         PT Treatment Interventions      PT Goals (Current goals can be found in the Care Plan section)  Acute Rehab PT Goals Patient Stated Goal: return home PT Goal Formulation: All assessment  and education complete, DC therapy    Frequency       Co-evaluation               AM-PAC PT "6 Clicks" Mobility  Outcome Measure Help needed turning from your back to your side while in a flat bed without using bedrails?: None Help needed moving from lying on your back to sitting on the side of a flat bed without using bedrails?: None Help needed moving to and from a bed to a chair (including a wheelchair)?: None Help needed standing up from a chair using your arms (e.g., wheelchair or bedside chair)?: None Help needed to walk in hospital room?: None Help needed climbing 3-5 steps with a railing? : None 6 Click Score: 24    End of Session   Activity Tolerance: Patient tolerated treatment well Patient left: in bed;with call bell/phone within reach Nurse Communication: Mobility status PT Visit Diagnosis: Other abnormalities of gait and mobility (R26.89)    Time: 1212-1220 PT Time Calculation (min) (ACUTE ONLY): 8 min   Charges:   PT Evaluation $PT Eval Low Complexity: 1 Low   PT General Charges $$ ACUTE PT VISIT: 1 Visit         Anise Salvo, PT Acute Rehab Services [facility] [phone]   Rayetta Humphrey 02/16/2023, 12:28 PM

## 2023-02-17 ENCOUNTER — Telehealth (HOSPITAL_COMMUNITY): Payer: Self-pay | Admitting: Pharmacist

## 2023-02-17 NOTE — Progress Notes (Signed)
ID Pharmacist Follow-Up    Noted that Ms. Valerie [name]'s cephalexin dose for discharge needed adjusted for indication of bacteremia from 500 mg Q 12 to 1 gm Q 8 hours. Spoke with Dr. Daiva Eves and called in new script to [facility] on [facility]. Left Message for Ms. Valerie [name] to please pick up new prescription and left callback information for questions.    Sharin Mons, PharmD, BCPS, BCIDP Infectious Diseases Clinical Pharmacist Phone: [phone] 02/17/2023 10:21 AM

## 2023-02-18 ENCOUNTER — Ambulatory Visit: Payer: Medicare Other | Admitting: Psychiatry

## 2023-02-18 ENCOUNTER — Ambulatory Visit
Admission: RE | Admit: 2023-02-18 | Discharge: 2023-02-18 | Disposition: A | Discharge status: Home / Self Care | Payer: Medicare Other | Source: Ambulatory Visit | Attending: Family Medicine | Admitting: Family Medicine

## 2023-02-18 DIAGNOSIS — Z1231 Encounter for screening mammogram for malignant neoplasm of breast: Secondary | ICD-10-CM

## 2023-02-18 DIAGNOSIS — M8588 Other specified disorders of bone density and structure, other site: Secondary | ICD-10-CM | POA: Diagnosis not present

## 2023-02-18 DIAGNOSIS — E2839 Other primary ovarian failure: Secondary | ICD-10-CM | POA: Diagnosis not present

## 2023-02-18 DIAGNOSIS — M810 Age-related osteoporosis without current pathological fracture: Secondary | ICD-10-CM

## 2023-02-18 DIAGNOSIS — N958 Other specified menopausal and perimenopausal disorders: Secondary | ICD-10-CM | POA: Diagnosis not present

## 2023-03-02 DIAGNOSIS — N200 Calculus of kidney: Secondary | ICD-10-CM | POA: Diagnosis not present

## 2023-03-04 ENCOUNTER — Ambulatory Visit: Payer: Medicare Other | Admitting: Psychiatry

## 2023-03-04 DIAGNOSIS — F411 Generalized anxiety disorder: Secondary | ICD-10-CM

## 2023-03-04 NOTE — Progress Notes (Signed)
 [facility] Counselor/Therapist Progress Note  Patient ID: Valerie [name], MRN: [id number],    Date: 03/04/2023  Time Spent: 55 minutes  Treatment Type: Individual Therapy  Reported Symptoms: anxious, depressed, unresolved grief, some anger within extended family (sister and 2 neices)    Mental Status Exam:  Appearance:   Casual     Behavior:  Appropriate, Sharing, and Motivated  Motor:  Normal  Speech/Language:   Clear and Coherent  Affect:  Depressed and anxious  Mood:  angry, anxious, and depressed  Thought process:  goal directed  Thought content:    Rumination  Sensory/Perceptual disturbances:    WNL  Orientation:  oriented to person, place, time/date, situation, day of week, month of year, year, and stated date of Jan. 2, 2025  Attention:  Fair  Concentration:  Fair  Memory:  WNL  Fund of knowledge:   Good  Insight:    Fair  Judgment:   Good and Fair  Impulse Control:  Good and Fair   Risk Assessment: Danger to Self:  No Self-injurious Behavior: No Danger to Others: No Duty to Warn:no Physical Aggression / Violence:No  Access to Firearms a concern: No  Gang Involvement:No   Subjective:      Patient in for her appointment today and works further on her grief issues regarding mother's recent death, frustration and anger issues within the family especially with her 2 sisters, patient's ongoing mixed feelings of grief and anger, and trying to let go in order to eventually move forward in her life and feel more emotionally healthy. Has felt she was out of control over changing anything, especially other people and circumstances within the larger family group. Is writing letter to express some of her thoughts and feelings but doing it more for me to release and not expecting change. Lot of family dynamics involved before and after patient's mother's death, shared and processed in session today. Patient realizing she has become too consumed emotionally but trying  to be more emotionally stable. Lots of issues over the years within family, leaving some long-time bitterness, and wanting to continue working through her built-up hurt and guilt, anger, frustration and eventually be totally independent of family and not have lingering thoughts or anger related to the dysfunction family relationships. Admits holding onto anger and adds Marvina is not hot enough for my mother, as indication of where she still is with her resentment. Motivated and wanting to feel better, and have much healthier boundaries within the family. Some continued work on her anger and bitterness and acknowledges she needs to work on this more intentionally and commits to doing so in the future. Wants to continue work on her unresolved grief, anger, hurt, and frustrations. Has used journaling between sessions. Appreciated some of husband's support but needs for him to be less directive. Working on healthier boundaries. Looking at healthy grief versus unhealthy grief. Today is more encouraging of herself. Trying to decrease her self-sabotage. Some increased sense of strength within herself, healthier communication skills, trying to decrease self-sabotage and feeling s of inadequacy, and healthier communication with other family members and people outside of her family.  Interventions: Cognitive Behavioral Therapy, Ego-Supportive, and Grief Therapy  Long-term goal: Increase understanding of beliefs and messages that produce the worry and anxiety. Short-term goal: Reduce overall level, frequency, and intensity of the anxiety so that daily functioning is not impaired. Strategies: Patient to learn further about anxiety re: daily skills for managing it, and to use the skills effectively  in everyday life.   Diagnosis:   ICD-10-CM   1. Generalized anxiety disorder  F41.1      Plan: Patient today showing some improved focus and work on her anger, resentment, and anxiety following death of mother  recently and added family hurt and chaos. Patient is making progress and needs to continue working with her goal-directed behaviors to move in an healthier and more positive direction mentally and emotionally.  Reminded and encouraged patient to be practicing more often the positive and self affirming behaviors as noted and practiced in sessions including: Challenging and counteracting her self-doubt, paying attention to how she says what she says, stay in the present and focused on what she can change her control, understand that her anxious thoughts are just thoughts and are not necessarily going to play out in reality the way she might fear, decrease her feelings of inadequacy within the family, interrupt and decrease her overthinking and over analyzing, refrain from assuming worst-case scenarios, use of positive self talk, work more intentionally on having healthier boundaries especially within the family, be able to walk away from volatile situations when needed, and realize the strength she shows to be able to work in a more focused manner on her goal-directed behaviors and move in a direction that supports her improved overall emotional health and wellbeing.  Goal review and progress/challenges noted with patient.  Next appt within 2 weeks.   Barnie Bunde, LCSW

## 2023-03-19 ENCOUNTER — Ambulatory Visit (INDEPENDENT_AMBULATORY_CARE_PROVIDER_SITE_OTHER): Payer: Medicare Other | Admitting: Psychiatry

## 2023-03-19 ENCOUNTER — Other Ambulatory Visit: Payer: Self-pay | Admitting: Urology

## 2023-03-25 ENCOUNTER — Other Ambulatory Visit: Payer: Self-pay

## 2023-03-25 ENCOUNTER — Encounter (HOSPITAL_BASED_OUTPATIENT_CLINIC_OR_DEPARTMENT_OTHER): Payer: Self-pay | Admitting: Urology

## 2023-03-25 NOTE — Progress Notes (Signed)
Spoke w/ via phone for pre-op interview---Valerie [name] needs dos---- ISTAT        [name] results------02/12/23 chest xray, 02/11/23 EKG in chart & Epic COVID test -----patient states asymptomatic no test needed Arrive at -------0815 on Friday, 03/26/23 NPO after MN NO Solid Food.  Clear liquids from MN until---0715 Med rec completed Medications to take morning of surgery -----Prozac, Synthroid, Xanax prn, Prilosec prn Diabetic medication -----n/a Patient instructed no nail polish to be worn day of surgery Patient instructed to bring photo id and insurance card day of surgery Patient aware to have Driver (ride ) / caregiver    for 24 hours after surgery - husband, Valerie [name] Patient Special Instructions -----Dentures must be removed prior to OR. Nail polish should be removed or at least removed from your two index fingers. Pre-Op special Instructions -----none Patient verbalized understanding of instructions that were given at this phone interview. Patient denies chest pain, sob, fever, cough at the interview.   Patient follows w/ [facility], Dr. [name]. A copy of the last OV including H & P dated 11/20/22 is included in the chart.

## 2023-03-26 ENCOUNTER — Encounter (HOSPITAL_BASED_OUTPATIENT_CLINIC_OR_DEPARTMENT_OTHER): Payer: Self-pay | Admitting: Urology

## 2023-03-26 ENCOUNTER — Ambulatory Visit (HOSPITAL_BASED_OUTPATIENT_CLINIC_OR_DEPARTMENT_OTHER)
Admission: RE | Admit: 2023-03-26 | Discharge: 2023-03-26 | Disposition: A | Discharge status: Home / Self Care | Payer: Medicare Other | Source: Ambulatory Visit | Attending: Urology | Admitting: Urology

## 2023-03-26 ENCOUNTER — Other Ambulatory Visit: Payer: Self-pay

## 2023-03-26 ENCOUNTER — Encounter (HOSPITAL_BASED_OUTPATIENT_CLINIC_OR_DEPARTMENT_OTHER)
Admission: RE | Disposition: A | Discharge status: Home / Self Care | Payer: Self-pay | Source: Ambulatory Visit | Attending: Urology

## 2023-03-26 ENCOUNTER — Other Ambulatory Visit: Payer: Self-pay | Admitting: Behavioral Health

## 2023-03-26 ENCOUNTER — Ambulatory Visit (HOSPITAL_BASED_OUTPATIENT_CLINIC_OR_DEPARTMENT_OTHER): Payer: Medicare Other | Admitting: Anesthesiology

## 2023-03-26 DIAGNOSIS — N132 Hydronephrosis with renal and ureteral calculous obstruction: Secondary | ICD-10-CM

## 2023-03-26 DIAGNOSIS — F418 Other specified anxiety disorders: Secondary | ICD-10-CM

## 2023-03-26 DIAGNOSIS — K219 Gastro-esophageal reflux disease without esophagitis: Secondary | ICD-10-CM | POA: Insufficient documentation

## 2023-03-26 DIAGNOSIS — Z87442 Personal history of urinary calculi: Secondary | ICD-10-CM | POA: Insufficient documentation

## 2023-03-26 DIAGNOSIS — F411 Generalized anxiety disorder: Secondary | ICD-10-CM

## 2023-03-26 DIAGNOSIS — Z87891 Personal history of nicotine dependence: Secondary | ICD-10-CM | POA: Diagnosis not present

## 2023-03-26 DIAGNOSIS — E039 Hypothyroidism, unspecified: Secondary | ICD-10-CM

## 2023-03-26 DIAGNOSIS — N200 Calculus of kidney: Secondary | ICD-10-CM | POA: Insufficient documentation

## 2023-03-26 DIAGNOSIS — Z01818 Encounter for other preprocedural examination: Secondary | ICD-10-CM

## 2023-03-26 DIAGNOSIS — N201 Calculus of ureter: Secondary | ICD-10-CM | POA: Diagnosis not present

## 2023-03-26 HISTORY — DX: Gastro-esophageal reflux disease without esophagitis: K21.9

## 2023-03-26 HISTORY — DX: Constipation, unspecified: K59.00

## 2023-03-26 HISTORY — DX: Personal history of urinary calculi: Z87.442

## 2023-03-26 HISTORY — DX: Complete loss of teeth, unspecified cause, unspecified class: K08.109

## 2023-03-26 HISTORY — DX: Unspecified osteoarthritis, unspecified site: M19.90

## 2023-03-26 HISTORY — DX: Depression, unspecified: F32.A

## 2023-03-26 HISTORY — DX: Perforation of intestine (nontraumatic): K63.1

## 2023-03-26 HISTORY — PX: CYSTOSCOPY/URETEROSCOPY/HOLMIUM LASER/STENT PLACEMENT: SHX6546

## 2023-03-26 HISTORY — DX: Benign neoplasm of pituitary gland: D35.2

## 2023-03-26 HISTORY — DX: Headache, unspecified: R51.9

## 2023-03-26 HISTORY — DX: Presence of spectacles and contact lenses: Z97.3

## 2023-03-26 HISTORY — DX: Tubulo-interstitial nephritis, not specified as acute or chronic: N12

## 2023-03-26 HISTORY — DX: Personal history of other diseases of the digestive system: Z87.19

## 2023-03-26 HISTORY — DX: Hypothyroidism, unspecified: E03.9

## 2023-03-26 HISTORY — DX: Anxiety disorder, unspecified: F41.9

## 2023-03-26 LAB — POCT I-STAT, CHEM 8
BUN: 21 mg/dL (ref 8–23)
Calcium, Ion: 1.29 mmol/L (ref 1.15–1.40)
Chloride: 109 mmol/L (ref 98–111)
Creatinine, Ser: 1.1 mg/dL — ABNORMAL HIGH (ref 0.44–1.00)
Glucose, Bld: 79 mg/dL (ref 70–99)
HCT: 32 % — ABNORMAL LOW (ref 36.0–46.0)
Hemoglobin: 10.9 g/dL — ABNORMAL LOW (ref 12.0–15.0)
Potassium: 3.9 mmol/L (ref 3.5–5.1)
Sodium: 142 mmol/L (ref 135–145)
TCO2: 21 mmol/L — ABNORMAL LOW (ref 22–32)

## 2023-03-26 SURGERY — CYSTOSCOPY/URETEROSCOPY/HOLMIUM LASER/STENT PLACEMENT
Anesthesia: General | Site: Renal | Laterality: Left

## 2023-03-26 MED ORDER — OXYBUTYNIN CHLORIDE 5 MG PO TABS
ORAL_TABLET | ORAL | Status: AC
  Filled 2023-03-26: qty 1

## 2023-03-26 MED ORDER — AMISULPRIDE (ANTIEMETIC) 5 MG/2ML IV SOLN
10.0000 mg | Freq: Once | INTRAVENOUS | Status: DC | PRN
Start: 2023-03-26 — End: 2023-03-26

## 2023-03-26 MED ORDER — SODIUM CHLORIDE 0.9 % IR SOLN
Status: DC | PRN
  Administered 2023-03-26: 3000 mL via INTRAVESICAL

## 2023-03-26 MED ORDER — 0.9 % SODIUM CHLORIDE (POUR BTL) OPTIME
TOPICAL | Status: DC | PRN
  Administered 2023-03-26: 500 mL

## 2023-03-26 MED ORDER — EPHEDRINE SULFATE-NACL 50-0.9 MG/10ML-% IV SOSY
PREFILLED_SYRINGE | INTRAVENOUS | Status: DC | PRN
  Administered 2023-03-26 (×2): 5 mg via INTRAVENOUS

## 2023-03-26 MED ORDER — FENTANYL CITRATE (PF) 100 MCG/2ML IJ SOLN
INTRAMUSCULAR | Status: AC
  Filled 2023-03-26: qty 2

## 2023-03-26 MED ORDER — DEXAMETHASONE SODIUM PHOSPHATE 10 MG/ML IJ SOLN
INTRAMUSCULAR | Status: DC | PRN
  Administered 2023-03-26: 10 mg via INTRAVENOUS

## 2023-03-26 MED ORDER — MIDAZOLAM HCL 2 MG/2ML IJ SOLN
INTRAMUSCULAR | Status: DC | PRN
  Administered 2023-03-26: 2 mg via INTRAVENOUS

## 2023-03-26 MED ORDER — PHENYLEPHRINE 80 MCG/ML (10ML) SYRINGE FOR IV PUSH (FOR BLOOD PRESSURE SUPPORT)
PREFILLED_SYRINGE | INTRAVENOUS | Status: DC | PRN
  Administered 2023-03-26 (×4): 80 ug via INTRAVENOUS

## 2023-03-26 MED ORDER — HYDROCODONE-ACETAMINOPHEN 5-325 MG PO TABS: 1.0000 | ORAL_TABLET | ORAL | 0 refills | Status: AC | PRN

## 2023-03-26 MED ORDER — FENTANYL CITRATE (PF) 250 MCG/5ML IJ SOLN
INTRAMUSCULAR | Status: DC | PRN
  Administered 2023-03-26: 50 ug via INTRAVENOUS
  Administered 2023-03-26 (×2): 25 ug via INTRAVENOUS

## 2023-03-26 MED ORDER — ACETAMINOPHEN 500 MG PO TABS
1000.0000 mg | ORAL_TABLET | Freq: Once | ORAL | Status: AC
  Administered 2023-03-26: 1000 mg via ORAL

## 2023-03-26 MED ORDER — PROPOFOL 10 MG/ML IV BOLUS
INTRAVENOUS | Status: AC
  Filled 2023-03-26: qty 20

## 2023-03-26 MED ORDER — SULFAMETHOXAZOLE-TRIMETHOPRIM 800-160 MG PO TABS: 1.0000 | ORAL_TABLET | Freq: Two times a day (BID) | ORAL | 0 refills | Status: AC

## 2023-03-26 MED ORDER — FENTANYL CITRATE (PF) 100 MCG/2ML IJ SOLN: 25.0000 ug | INTRAMUSCULAR | Status: DC | PRN

## 2023-03-26 MED ORDER — ONDANSETRON HCL 4 MG/2ML IJ SOLN
INTRAMUSCULAR | Status: DC | PRN
  Administered 2023-03-26: 4 mg via INTRAVENOUS

## 2023-03-26 MED ORDER — IOHEXOL 300 MG/ML  SOLN
INTRAMUSCULAR | Status: DC | PRN
  Administered 2023-03-26: 3 mL

## 2023-03-26 MED ORDER — LIDOCAINE 2% (20 MG/ML) 5 ML SYRINGE
INTRAMUSCULAR | Status: DC | PRN
  Administered 2023-03-26: 60 mg via INTRAVENOUS

## 2023-03-26 MED ORDER — TAMSULOSIN HCL 0.4 MG PO CAPS: 0.4000 mg | ORAL_CAPSULE | Freq: Every day | ORAL | 1 refills | Status: AC

## 2023-03-26 MED ORDER — PROPOFOL 10 MG/ML IV BOLUS
INTRAVENOUS | Status: DC | PRN
  Administered 2023-03-26: 120 mg via INTRAVENOUS

## 2023-03-26 MED ORDER — ACETAMINOPHEN 500 MG PO TABS
ORAL_TABLET | ORAL | Status: AC
Start: 2023-03-26 — End: ?
  Filled 2023-03-26: qty 2

## 2023-03-26 MED ORDER — OXYBUTYNIN CHLORIDE 5 MG PO TABS
5.0000 mg | ORAL_TABLET | Freq: Once | ORAL | Status: AC
  Administered 2023-03-26: 5 mg via ORAL

## 2023-03-26 MED ORDER — CIPROFLOXACIN IN D5W 400 MG/200ML IV SOLN
INTRAVENOUS | Status: AC
Start: 2023-03-26 — End: ?
  Filled 2023-03-26: qty 200

## 2023-03-26 MED ORDER — SODIUM CHLORIDE 0.9 % IV SOLN: INTRAVENOUS | Status: DC

## 2023-03-26 MED ORDER — CIPROFLOXACIN IN D5W 400 MG/200ML IV SOLN
400.0000 mg | INTRAVENOUS | Status: AC
  Administered 2023-03-26: 400 mg via INTRAVENOUS

## 2023-03-26 MED ORDER — OXYBUTYNIN CHLORIDE ER 10 MG PO TB24
10.0000 mg | ORAL_TABLET | Freq: Every day | ORAL | 0 refills | Status: AC
Start: 2023-03-26 — End: ?

## 2023-03-26 MED ORDER — MIDAZOLAM HCL 2 MG/2ML IJ SOLN
INTRAMUSCULAR | Status: AC
  Filled 2023-03-26: qty 2

## 2023-03-26 SURGICAL SUPPLY — 23 items
BAG DRAIN URO-CYSTO SKYTR STRL (DRAIN) ×1 IMPLANT
BASKET ZERO TIP NITINOL 2.4FR (BASKET) IMPLANT
CATH SET URETHRAL DILATOR (CATHETERS) IMPLANT
CATH URETL OPEN 5X70 (CATHETERS) ×1 IMPLANT
CLOTH BEACON ORANGE TIMEOUT ST (SAFETY) ×1 IMPLANT
GLOVE BIO SURGEON STRL SZ7 (GLOVE) ×1 IMPLANT
GOWN STRL REUS W/TWL LRG LVL3 (GOWN DISPOSABLE) ×1 IMPLANT
GUIDEWIRE STR DUAL SENSOR (WIRE) ×1 IMPLANT
GUIDEWIRE ZIPWRE .038 STRAIGHT (WIRE) IMPLANT
IV NS 1000ML BAXH (IV SOLUTION) ×1 IMPLANT
IV NS IRRIG 3000ML ARTHROMATIC (IV SOLUTION) ×1 IMPLANT
KIT TURNOVER CYSTO (KITS) ×1 IMPLANT
LASER FIB FLEXIVA PULSE ID 365 (Laser) IMPLANT
MANIFOLD NEPTUNE II (INSTRUMENTS) ×1 IMPLANT
NS IRRIG 500ML POUR BTL (IV SOLUTION) ×1 IMPLANT
PACK CYSTO (CUSTOM PROCEDURE TRAY) ×1 IMPLANT
SHEATH NAVIGATOR HD 11/13X36 (SHEATH) IMPLANT
SLEEVE SCD COMPRESS KNEE MED (STOCKING) ×1 IMPLANT
STENT URET 6FRX24 CONTOUR (STENTS) IMPLANT
SYR 10ML LL (SYRINGE) ×1 IMPLANT
TRACTIP FLEXIVA PULS ID 200XHI (Laser) IMPLANT
TUBE CONNECTING 12X1/4 (SUCTIONS) ×1 IMPLANT
TUBING UROLOGY SET (TUBING) ×1 IMPLANT

## 2023-03-26 NOTE — Discharge Instructions (Addendum)
[facility] [phone] Post Ureteroscopy With or Without Stent Instructions  Definitions:  Ureter: The duct that transports urine from the kidney to the bladder. Stent:   A plastic hollow tube that is placed into the ureter, from the kidney to the bladder to prevent the ureter from swelling shut.  GENERAL INSTRUCTIONS:  Despite the fact that no skin incisions were used, the area around the ureter and bladder is raw and irritated. The stent is a foreign body which will further irritate the bladder wall. This irritation is manifested by increased frequency of urination, both day and night, and by an increase in the urge to urinate. In some, the urge to urinate is present almost always. Sometimes the urge is strong enough that you may not be able to stop yourself from urinating. The only real cure is to remove the stent and then give time for the bladder wall to heal which can't be done until the danger of the ureter swelling shut has passed, which varies.  You may see some blood in your urine while the stent is in place and a few days afterwards. Do not be alarmed, even if the urine was clear for a while. Get off your feet and drink lots of fluids until clearing occurs. If you start to pass clots or don't improve, call us.  DIET: You may return to your normal diet immediately. Because of the raw surface of your bladder, alcohol, spicy foods, acid type foods and drinks with caffeine may cause irritation or frequency and should be used in moderation. To keep your urine flowing freely and to avoid constipation, drink plenty of fluids during the day ( 8-10 glasses ). Tip: Avoid cranberry juice because it is very acidic.  ACTIVITY: Your physical activity doesn't need to be restricted. However, if you are very active, you may see some blood in your urine. We suggest that you reduce your activity under these circumstances until the bleeding has stopped.  BOWELS: It is important to  keep your bowels regular during the postoperative period. Straining with bowel movements can cause bleeding. A bowel movement every other day is reasonable. Use a mild laxative if needed, such as Milk of Magnesia 2-3 tablespoons, or 2 Dulcolax tablets. Call if you continue to have problems. If you have been taking narcotics for pain, before, during or after your surgery, you may be constipated. Take a laxative if necessary.   MEDICATION: You should resume your pre-surgery medications unless told not to. In addition you will often be given an antibiotic to prevent infection. These should be taken as prescribed until the bottles are finished unless you are having an unusual reaction to one of the drugs.  PROBLEMS YOU SHOULD REPORT TO [place]: Fevers over 100.5 Fahrenheit. Heavy bleeding, or clots ( See above notes about blood in urine ). Inability to urinate. Drug reactions ( hives, rash, nausea, vomiting, diarrhea ). Severe burning or pain with urination that is not improving.  FOLLOW-UP: You will need a follow-up appointment to monitor your progress. Call for this appointment at the number listed above. Usually the first appointment will be about three to fourteen days after your surgery.      You were given Tylenol prior to your surgical procedure, you may take Tylenol again after 3pm today  You have a stent in place and may remove stent on Monday AM by pulling on attached string.    Post Anesthesia Home Care Instructions  Activity: Get plenty of rest for the  remainder of the day. A responsible individual must stay with you for 24 hours following the procedure.  For the next 24 hours, DO NOT: -Drive a car -Advertising copywriter -Drink alcoholic beverages -Take any medication unless instructed by your physician -Make any legal decisions or sign important papers.  Meals: Start with liquid foods such as gelatin or soup. Progress to regular foods as tolerated. Avoid greasy, spicy, heavy  foods. If nausea and/or vomiting occur, drink only clear liquids until the nausea and/or vomiting subsides. Call your physician if vomiting continues.  Special Instructions/Symptoms: Your throat may feel dry or sore from the anesthesia or the breathing tube placed in your throat during surgery. If this causes discomfort, gargle with warm salt water. The discomfort should disappear within 24 hours.

## 2023-03-26 NOTE — Anesthesia Postprocedure Evaluation (Signed)
Anesthesia Post Note  Patient: Doctor, general practice  Procedure(s) Performed: CYSTOSCOPY/LEFT URETEROSCOPY/LEFT RETROGRADE PYELOGRAM/HOLMIUM LASER/LEFT STENT EXCHANGE (Left: Renal)     Patient location during evaluation: PACU Anesthesia Type: General Level of consciousness: awake Pain management: pain level controlled Vital Signs Assessment: post-procedure vital signs reviewed and stable Respiratory status: spontaneous breathing, nonlabored ventilation and respiratory function stable Cardiovascular status: blood pressure returned to baseline and stable Postop Assessment: no apparent nausea or vomiting Anesthetic complications: no   No notable events documented.  Last Vitals:  Vitals:   03/26/23 1115 03/26/23 1130  BP:  123/76  Pulse:    Resp:    Temp:    SpO2: 95%     Last Pain:  Vitals:   03/26/23 1130  TempSrc:   PainSc: 0-No pain                 Linton Rump

## 2023-03-26 NOTE — Op Note (Signed)
Operative Note  Preoperative diagnosis:  1.  Left ureteral stone  Postoperative diagnosis: 1.  Left renal stone  Procedure(s): 1.  Cystoscopy 2. Left ureteroscopy with laser lithotripsy and basket extraction of stones 3. Left retrograde pyelogram 4. Left ureteral stent exchange 5. Fluoroscopy with intraoperative interpretation  Surgeon: Jettie Pagan, MD  Assistants:  None  Anesthesia:  General  Complications:  None  EBL:  Minimal  Specimens: 1. Stones for stone analysis (to be done at [facility])  Drains/Catheters: 1.  Left 6Fr x 24cm ureteral stent with a tether string  Intraoperative findings:   Cystoscopy demonstrated no suspicious bladder lesions. Left ureteroscopy demonstrated large 1.2cm left lower pole stone, relocating to upper pole and successfully treated with fragments removed.  Left retrograde pyelogram with moderate left hydronephrosis. Successful stent placement.  Indication:  Valerie [name] is a 63 y.o. female with a large left ureteral stone with history of sepsis s/p stent placement here for definitive treatment of stone.  Description of procedure: After informed consent was obtained from the patient, the patient was identified and taken to the operating room and placed in the supine position.  General anesthesia was administered as well as perioperative IV antibiotics.  At the beginning of the case, a time-out was performed to properly identify the patient, the surgery to be performed, and the surgical site.  Sequential compression devices were applied to the lower extremities at the beginning of the case for DVT prophylaxis.  The patient was then placed in the dorsal lithotomy supine position, prepped and draped in sterile fashion.  Preliminary scout fluoroscopy revealed that there was a 1.2cm calcification area at the left lower pole, which corresponds to the stone found on the preoperative CT scan. We then passed the 21-French rigid cystoscope  through the urethra and into the bladder under vision without any difficulty, noting a normal urethra.  A systematic evaluation of the bladder revealed no evidence of any suspicious bladder lesions.  Ureteral orifices were in normal position.    The distal aspect of the ureteral stent was seen protruding from the left ureteral orifice.  We then used the alligator-tooth forceps and grasped the distal end of the ureteral stent and brought it out the urethral meatus while watching the proximal coil straighten out nicely on fluoroscopy. Through the ureteral stent, we then passed a 0.038 sensor wire up to the level of the renal pelvis.  The ureteral stent was then removed, leaving the sensor wire up the left ureter.    Under cystoscopic and flouroscopic guidance, we cannulated the left ureteral orifice with a 5-French open-ended ureteral catheter and a gentle retrograde pyelogram was performed, revealing a normal caliber ureter without any filling defects. There was moderate hydronephrosis of the collecting system. A 0.038 sensor wire was then passed up to the level of the renal pelvis and secured to the drape as a safety wire. The ureteral catheter and cystoscope were removed, leaving the safety wire in place.   A semi-rigid ureteroscope was passed alongside the wire up the distal ureter which appeared normal. A second 0.038 sensor wire was passed under direct vision and the semirigid scope was removed. An 11/13Fr ureteral access sheath was carefully advanced up the ureter to the level of the UPJ over this wire under fluoroscopic guidance. The flexible ureteroscope was advanced into the collecting system via the access sheath. The collecting system was inspected. The calculus was identified at the left lower pole. Using a zero tip basket, this was moved to  the left upper pole. Using the 272 micron holmium laser fiber, the stone was dusted and fragmented completely. A 2.2 Fr zero tip basket was used to remove the  fragments under visual guidance. These were sent for chemical analysis. With the ureteroscope in the kidney, a gentle pyelogram was performed to delineate the calyceal system and we evaluated the calyces systematically. We encountered no further large fragments. The rest of the stone fragments were very tiny and these were irrigated away gently. The calyces were re-inspected and there were no significant stone fragment residual.   We then withdrew the ureteroscope back down the ureter along with the access sheath, noting no evidence of any stones along the course of the ureter.  Prior to removing the ureteroscope, we did pass the Glidewire back up to the ureter to the renal pelvis.  Once the ureteroscope was removed, we then used the Glidewire under fluoroscopic guidance and passed up a 6-French x 24 cm double-pigtail ureteral stent up the ureter, making sure that the proximal and distal ends coiled within the kidney and bladder respectively.  Note that we left a long tether string attached to the distal end of the ureteral stent and it exited the urethral meatus and tucked into the vagina.  The cystoscope was then advanced back into the bladder under vision.  We were able to see the distal stent coiling nicely within the bladder.  The bladder was then emptied with irrigation solution.  The cystoscope was then removed.    The patient tolerated the procedure well and there was no complication. Patient was awoken from anesthesia and taken to the recovery room in stable condition. I was present and scrubbed for the entirety of the case.  Plan:  Patient will be discharged home and remove her stent on Monday morning.  Matt R. Cammeron Greis MD [facility]  Pager: [phone]

## 2023-03-26 NOTE — Anesthesia Preprocedure Evaluation (Addendum)
Anesthesia Evaluation  Patient identified by MRN, date of birth, ID band Patient awake    Reviewed: Allergy & Precautions, NPO status , Patient's Chart, lab work & pertinent test results  History of Anesthesia Complications Negative for: history of anesthetic complications  Airway Mallampati: I  TM Distance: >3 FB Neck ROM: Full    Dental  (+) Dental Advisory Given, Edentulous Upper, Edentulous Lower   Pulmonary neg shortness of breath, neg sleep apnea, neg COPD, neg recent URI, former smoker   Pulmonary exam normal breath sounds clear to auscultation       Cardiovascular (-) hypertension(-) angina (-) Past MI, (-) Cardiac Stents and (-) CABG + dysrhythmias (prolonged QT)  Rhythm:Regular Rate:Normal  Previous grade I view with MAC 3   Neuro/Psych  Headaches, neg Seizures PSYCHIATRIC DISORDERS Anxiety Depression       GI/Hepatic Neg liver ROS, hiatal hernia,GERD  Medicated,,S/p sleeve gastrectomy   Endo/Other  neg diabetesHypothyroidism  Pituitary adenoma  Renal/GU Renal disease (stones)     Musculoskeletal  (+) Arthritis ,    Abdominal   Peds  Hematology negative hematology ROS (+)   Anesthesia Other Findings   Reproductive/Obstetrics                              Anesthesia Physical Anesthesia Plan  ASA: 3  Anesthesia Plan: General   Post-op Pain Management: Tylenol PO (pre-op)*   Induction: Intravenous  PONV Risk Score and Plan: 3 and Ondansetron, Dexamethasone and Treatment may vary due to age or medical condition  Airway Management Planned: LMA  Additional Equipment:   Intra-op Plan:   Post-operative Plan: Extubation in OR  Informed Consent: I have reviewed the patients History and Physical, chart, labs and discussed the procedure including the risks, benefits and alternatives for the proposed anesthesia with the patient or authorized representative who has indicated  his/her understanding and acceptance.     Dental advisory given  Plan Discussed with: CRNA and Anesthesiologist  Anesthesia Plan Comments: (Risks of general anesthesia discussed including, but not limited to, sore throat, hoarse voice, chipped/damaged teeth, injury to vocal cords, nausea and vomiting, allergic reactions, lung infection, heart attack, stroke, and death. All questions answered. )         Anesthesia Quick Evaluation

## 2023-03-26 NOTE — H&P (Signed)
Office Visit Report     03/02/2023   --------------------------------------------------------------------------------   Valerie [name]  MRN: [id number]  DOB: 1960-09-07, 63 year old Female  SSN:    PRIMARY CARE:     REFERRING:    PROVIDER:  Jettie Pagan, M.D.  LOCATION:  [facility] [phone]     --------------------------------------------------------------------------------   CC/HPI: Valerie [name] is a 63 year old female who is seen in consultation with a history of infected left UPJ stone.   1. Urolithiasis: She presented the ED on 02/11/2023 with sepsis due to obstructing left UPJ stone measuring 1.3 cm with leukocytosis 22, creatinine 1.38 from baseline 0.7, lactate 4.8. She underwent emergent left stent placement. Urine culture 02/12/2023 resulted no growth. On CT imaging, additional small nonobstructing left renal stone measuring up to 5 mm in the left lower pole.   She has had some urgency with the stent however denies dysuria, fevers, chills. Urinalysis today with positive nitrites.   Patient currently denies fever, chills, sweats, nausea, vomiting, abdominal or flank pain, gross hematuria or dysuria.     ALLERGIES: Penicillin - Hives    MEDICATIONS: Levothyroxine Sodium  Colace  Linzess 290 mcg capsule  Prozac  Senokot  Seroquel  Temazepam  Xanax 0.5 mg tablet     GU PSH: Ovary Removal Partial or Total     NON-GU PSH: Appendectomy (laparoscopic) Cholecystectomy (laparoscopic) Gastric sleeve Tonsillectomy     GU PMH: None   NON-GU PMH: Anxiety Depression GERD Stroke/TIA    FAMILY HISTORY: 2 sons - Runs in Family   SOCIAL HISTORY: Marital Status: Married Preferred Language: English; Race: White Current Smoking Status: Patient does not smoke anymore. Smoked for 20 years. Smoked 2 packs per day.   Tobacco Use Assessment Completed: Used Tobacco in last 30 days? Does drink.  Drinks 2 caffeinated drinks per day.    REVIEW  OF SYSTEMS:    GU Review Female:   Patient reports frequent urination, hard to postpone urination, burning /pain with urination, get up at night to urinate, and leakage of urine. Patient denies stream starts and stops, trouble starting your stream, have to strain to urinate, and being pregnant.  Gastrointestinal (Upper):   Patient denies nausea, vomiting, and indigestion/ heartburn.  Gastrointestinal (Lower):   Patient denies diarrhea and constipation.  Constitutional:   Patient denies fever, night sweats, weight loss, and fatigue.  Skin:   Patient denies skin rash/ lesion and itching.  Eyes:   Patient denies blurred vision and double vision.  Ears/ Nose/ Throat:   Patient denies sore throat and sinus problems.  Hematologic/Lymphatic:   Patient denies swollen glands and easy bruising.  Cardiovascular:   Patient denies leg swelling and chest pains.  Respiratory:   Patient denies cough and shortness of breath.  Endocrine:   Patient denies excessive thirst.  Musculoskeletal:   Patient denies back pain and joint pain.  Neurological:   Patient denies headaches and dizziness.  Psychologic:   Patient denies depression and anxiety.   VITAL SIGNS:      03/02/2023 09:30 AM  Weight 119 lb / 53.98 kg  Height 61 in / 154.94 cm  BP 119/78 mmHg  Pulse 73 /min  Temperature 97.3 F / 36.2 C  BMI 22.5 kg/m   MULTI-SYSTEM PHYSICAL EXAMINATION:    Constitutional: Well-nourished. No physical deformities. Normally developed. Good grooming.  Respiratory: No labored breathing, no use of accessory muscles.   Cardiovascular: Normal temperature, normal extremity pulses, no swelling, no varicosities.  Gastrointestinal: No mass, no  tenderness, no rigidity, non obese abdomen.     Complexity of Data:  Source Of History:  Patient, Medical Record Summary  Records Review:   Previous Doctor Records, Previous Hospital Records, Previous Patient Records  Urine Test Review:   Urinalysis  X-Ray Review: C.T.  Abdomen/Pelvis: Reviewed Films. Reviewed Report. Discussed With Patient.     PROCEDURES:          Visit Complexity - G2211          Urinalysis w/Scope Dipstick Dipstick Cont'd Micro  Color: Yellow Bilirubin: Neg mg/dL WBC/hpf: 40 - 16/XWR  Appearance: Cloudy Ketones: Neg mg/dL RBC/hpf: 20 - 60/AVW  Specific Gravity: 1.025 Blood: 3+ ery/uL Bacteria: Mod (26-50/hpf)  pH: 6.0 Protein: 3+ mg/dL Cystals: NS (Not Seen)  Glucose: Neg mg/dL Urobilinogen: 0.2 mg/dL Casts: NS (Not Seen)    Nitrites: Positive Trichomonas: Not Present    Leukocyte Esterase: 3+ leu/uL Mucous: Not Present      Epithelial Cells: NS (Not Seen)      Yeast: NS (Not Seen)      Sperm: Not Present    Notes: Micro performed on unspun urine due to QNS    ASSESSMENT:      ICD-10 Details  1 GU:   Renal calculus - N20.0    PLAN:           Orders Labs Urine Culture          Document Letter(s):  Created for Patient: Clinical Summary         Notes:    1. Urolithiasis:  -CT 02/11/2023 with 1.3 cm left UPJ stone as well as small left lower pole stones measuring 5 mm.  -Start letter submitted for left ureteroscopy with laser lithotripsy and basket traction of stone and stent exchange. Discussed risk and benefits including risk of infection, bleeding, injury, possible need for multiple procedures.  -Sent urine culture today. Will plan to call in antibiotics later this week. Will have her follow-up for urine culture drop-off after completing antibiotics.   We discussed the options for management of kidney stones, including observation, ESWL, ureteroscopy with laser lithotripsy, and PCNL. The risks and benefits of each option were discussed.  For observation I described the risks which include but are not limited to silent renal damage, life-threatening infection, need for emergent surgery, failure to pass stone, and pain.   ESWL: risks and benefits of ESWL were outlined including infection, bleeding, pain,  steinstrasse, kidney injury, need for ancillary treatments, and global anesthesia risks including but not limited to CVA, MI, DVT, PE, pneumonia, and death.   Ureteroscopy: risks and benefits of ureteroscopy were outlined, including infection, bleeding, pain, temporary ureteral stent and associated stent bother, ureteral injury, ureteral stricture, need for ancillary treatments, and global anesthesia risks including but not limited to CVA, MI, DVT, PE, pneumonia, and death.   PCNL: risks and benefits of PCNL were outlined including infection, bleeding, blood transfusion, pain, pneumothorax, bowel injury, persistent urine leak, positioning injury, inability to clear stone burden, renal laceration, arterial venous fistula or malformation, need for ancillary treatments, and global anesthesia risks including but not limited to CVA, MI, DVT, PE, pneumonia, and death.    Urology Preoperative H&P   Chief Complaint: Left renal stones  History of Present Illness: Sherissa Rorrer is a 63 y.o. female with left renal stones here for cysto, L RPG, L URS/LL, L stent. Denies fevers or chills. Ucx no growth on 03/15/2023.    Past Medical History:  Diagnosis Date   Anxiety  Follows w/ [facility].   Arthritis    osteoarthritis   Bowel perforation (HCC)    hx of , during tubal ligation per pt   Constipation    Follows w/ [facility].   Depression    Follows w/ [facility].   Full dentures    GERD (gastroesophageal reflux disease)    hx of LINX procedure, follows with [facility]   Headache    hx of migraines   History of hiatal hernia    History of kidney stones    Hypothyroidism    s/p thyroidectomy and parotidectomy   Nephrolithiasis 2021   hospital admission, nephrostomy tube, PICC line   Pituitary adenoma (HCC)    removed in 1981   Pyelonephritis    02/11/23 hospital admission w/ sepsis/ had cystoscopy and stent placement   Small bowel obstruction (HCC)    several    Wears glasses     Past Surgical History:  Procedure Laterality Date   BILATERAL CARPAL TUNNEL RELEASE Bilateral    1990's   CESAREAN SECTION     x 2, 1985 & 1987   CHOLECYSTECTOMY     CYSTOSCOPY W/ URETERAL STENT PLACEMENT Left 02/11/2023   Procedure: CYSTOSCOPY WITH RETROGRADE PYELOGRAM/URETERAL STENT PLACEMENT;  Surgeon: Jannifer Hick, MD;  Location: [facility];  Service: Urology;  Laterality: Left;   LAPAROSCOPIC GASTRIC SLEEVE RESECTION  2016   LAPAROSCOPIC LYSIS OF ADHESIONS     multiple due to several bowel obstructions   OTHER SURGICAL HISTORY     LINX procedure, do not insert traditional NG, around 2016   PITUITARY SURGERY  1981   removal of adenoma   THYROIDECTOMY     around 2014, pt unsure of date   TOTAL ABDOMINAL HYSTERECTOMY     TUBAL LIGATION Right     Allergies:  Allergies  Allergen Reactions   Oxycodone Hives and Itching   Oxycodone-Acetaminophen Hives and Itching   Penicillins Hives, Itching, Palpitations and Other (See Comments)    Did it involve swelling of the face/tongue/throat, SOB, or low BP? No Did it involve sudden or severe rash/hives, skin peeling, or any reaction on the inside of your mouth or nose? Yes Did you need to seek medical attention at a hospital or doctor's office? No When did it last happen?      Over 30 Years Ago If all above answers are "NO", may proceed with cephalosporin use.     Potassium Other (See Comments)    Causes severe headaches    Family History  Problem Relation Age of Onset   Breast cancer Neg Hx     Social History:  reports that she has quit smoking. Her smoking use included cigarettes. She has never used smokeless tobacco. She reports that she does not drink alcohol and does not use drugs.  ROS: A complete review of systems was performed.  All systems are negative except for pertinent findings as noted.  Physical Exam:  Vital signs in last 24 hours: Temp:  [98 F (36.7 C)] 98 F (36.7 C) (01/24 0822) Pulse  Rate:  [65] 65 (01/24 0822) Resp:  [16] 16 (01/24 0822) BP: (128)/(87) 128/87 (01/24 0822) SpO2:  [99 %] 99 % (01/24 0822) Weight:  [49.4 kg-49.9 kg] 49.4 kg (01/24 1610) Constitutional:  Alert and oriented, No acute distress Cardiovascular: Regular rate and rhythm Respiratory: Normal respiratory effort, Lungs clear bilaterally GI: Abdomen is soft, nontender, nondistended, no abdominal masses GU: No CVA tenderness Lymphatic: No lymphadenopathy Neurologic: Grossly intact,  no focal deficits Psychiatric: Normal mood and affect  Laboratory Data:  Recent Labs    03/26/23 0902  HGB 10.9*  HCT 32.0*    Recent Labs    03/26/23 0902  NA 142  K 3.9  CL 109  GLUCOSE 79  BUN 21  CREATININE 1.10*     Results for orders placed or performed during the hospital encounter of 03/26/23 (from the past 24 hours)  I-STAT, chem 8     Status: Abnormal   Collection Time: 03/26/23  9:02 AM  Result Value Ref Range   Sodium 142 135 - 145 mmol/L   Potassium 3.9 3.5 - 5.1 mmol/L   Chloride 109 98 - 111 mmol/L   BUN 21 8 - 23 mg/dL   Creatinine, Ser 3.08 (H) 0.44 - 1.00 mg/dL   Glucose, Bld 79 70 - 99 mg/dL   Calcium, Ion 6.57 8.46 - 1.40 mmol/L   TCO2 21 (L) 22 - 32 mmol/L   Hemoglobin 10.9 (L) 12.0 - 15.0 g/dL   HCT 96.2 (L) 95.2 - 84.1 %   No results found for this or any previous visit (from the past 240 hours).  Renal Function: Recent Labs    03/26/23 0902  CREATININE 1.10*   Estimated Creatinine Clearance: 39 mL/min (A) (by C-G formula based on SCr of 1.1 mg/dL (H)).  Radiologic Imaging: No results found.  I independently reviewed the above imaging studies.  Assessment and Plan Ameira Debell is a 63 y.o. female with left renal stones here for cysto, L RPG, L URS/LL, L stent.    -The risks, benefits and alternatives of cystoscopy with L RPG, L URS/LL, L stent.  JJ stent placement was discussed with the patient.  Risks include, but are not limited to: bleeding, urinary tract  infection, ureteral injury, ureteral stricture disease, chronic pain, urinary symptoms, bladder injury, stent migration, the need for nephrostomy tube placement, MI, CVA, DVT, PE and the inherent risks with general anesthesia.  The patient voices understanding and wishes to proceed.    Matt R. Najiyah Paris MD 03/26/2023, 9:32 AM  [facility] Pager: [phone]): [phone]

## 2023-03-26 NOTE — Anesthesia Procedure Notes (Signed)
Procedure Name: LMA Insertion Date/Time: 03/26/2023 9:46 AM  Performed by: Dairl Ponder, CRNAPre-anesthesia Checklist: Patient identified, Emergency Drugs available, Suction available and Patient being monitored Patient Re-evaluated:Patient Re-evaluated prior to induction Oxygen Delivery Method: Circle System Utilized Preoxygenation: Pre-oxygenation with 100% oxygen Induction Type: IV induction Ventilation: Mask ventilation without difficulty LMA: LMA inserted LMA Size: 3.0 Number of attempts: 1 Airway Equipment and Method: Bite block Placement Confirmation: positive ETCO2 Tube secured with: Tape Dental Injury: Teeth and Oropharynx as per pre-operative assessment

## 2023-03-26 NOTE — Transfer of Care (Signed)
Immediate Anesthesia Transfer of Care Note  Patient: Valerie [name]  Procedure(s) Performed: CYSTOSCOPY/LEFT URETEROSCOPY/LEFT RETROGRADE PYELOGRAM/HOLMIUM LASER/LEFT STENT EXCHANGE (Left: Renal)  Patient Location: PACU  Anesthesia Type:General  Level of Consciousness: drowsy and patient cooperative  Airway & Oxygen Therapy: Patient Spontanous Breathing  Post-op Assessment: Report given to RN and Post -op Vital signs reviewed and stable  Post vital signs: Reviewed and stable  Last Vitals:  Vitals Value Taken Time  BP 124/69 03/26/23 1101  Temp 36.6 C 03/26/23 1101  Pulse 69 03/26/23 1103  Resp 13 03/26/23 1103  SpO2 94 % 03/26/23 1103  Vitals shown include unfiled device data.  Last Pain:  Vitals:   03/26/23 1101  TempSrc:   PainSc: Asleep      Patients Stated Pain Goal: 3 (03/26/23 1914)  Complications: No notable events documented.

## 2023-03-27 ENCOUNTER — Encounter (HOSPITAL_BASED_OUTPATIENT_CLINIC_OR_DEPARTMENT_OTHER): Payer: Self-pay | Admitting: Urology

## 2023-04-01 ENCOUNTER — Ambulatory Visit (INDEPENDENT_AMBULATORY_CARE_PROVIDER_SITE_OTHER): Payer: Medicare Other | Admitting: Psychiatry

## 2023-04-01 DIAGNOSIS — F411 Generalized anxiety disorder: Secondary | ICD-10-CM

## 2023-04-01 NOTE — Progress Notes (Signed)
[facility] Counselor/Therapist Progress Note  Patient ID: Valerie [name], MRN: [id number],    Date: 04/01/2023  Time Spent: 55 minutes   Treatment Type: Individual Therapy  Reported Symptoms: anxious, depressed, some anger with extended family (sister, 2 nieces), unresolved grief    Mental Status Exam:  Appearance:   Casual     Behavior:  Appropriate, Sharing, and Motivated  Motor:  Normal  Speech/Language:   Clear and Coherent  Affect:  Depressed and anxious  Mood:  anxious and depressed  Thought process:  goal directed  Thought content:    Rumination  Sensory/Perceptual disturbances:    WNL  Orientation:  oriented to person, place, time/date, situation, day of week, month of year, year, and stated date of Jan. 30, 2025  Attention:  Good  Concentration:  Good and Fair  Memory:  WNL  Fund of knowledge:   Good  Insight:    Good and Fair  Judgment:   Good  Impulse Control:  Good and Fair   Risk Assessment: Danger to Self:  No Self-injurious Behavior: No Danger to Others: No Duty to Warn:no Physical Aggression / Violence:No  Access to Firearms a concern: No  Gang Involvement:No   Subjective:  Patient today working more on her anger and frustration mostly within the family and especially her siblings, grief issues regarding mother's recent death, ongoing mixed feelings of grief and anger, and "trying to let go" in order to move forward in her life and feel more emotionally healthy. Very difficult for patient in her efforts to work through some intense anger and frustration with her sister and 2 neices. Difficulty at times with husband's behavior and reactions with patient, which patient shared several of these in session today and worked on them in terms of how she responds and what would help more in their marital relationship. Working on not "jumping to conclusions" and healthier ways of managing her anger re: especially within the family. More open to certain  strategies today but difficult to have hope for changing. Processed her issues around her anger and hurt. Is invested in working on her hurt and anger but also very difficult, and trying to see some changes as possible. Family dynamics entrenched in some ways and patient struggles with powerlessness at times within the family.  Before ending session, we discussed again the above-mentioned strategies and she is to work with these between now and next session, including interruption of certain thoughts and feelings and replacement with healthier thoughts and feelings.  A little less overall anger at mother and other family members however still dwells on certain situations that feed her anger and we discussed to examples of this happening in session today which seemed to help patient and better understanding that cycle.  To continue working on not self sabotaging, see the strength within herself, practice healthier communication skills, confront the feelings of inadequacy as they are not true, and practicing healthier communication within her immediate family and friends outside of the family.   Interventions: Cognitive Behavioral Therapy and Ego-Supportive  Long-term goal: Increase understanding of beliefs and messages that produce the worry and anxiety. Short-term goal: Reduce overall level, frequency, and intensity of the anxiety so that daily functioning is not impaired. Strategies: Patient to learn further about anxiety re: daily skills for managing it, and to use the skills effectively in everyday life.   Diagnosis:   ICD-10-CM   1. Generalized anxiety disorder  F41.1      Plan: Patient working  quite well today on her issues of anger and hurt within her extended family of sister and adult nieces.  Was very vocal in session today and acknowledges that it is hard for her to let go of things that bother her.  Did work quite well on this in session.  Patient has made progress and needs to continue  working with her goal-directed behaviors to move in a more healthy and hopeful direction emotionally and mentally. Encouraged patient to be practicing more often the positive and self affirming behaviors as noted and practiced in sessions including: Challenging and counteracting self-doubt, paying attention to how she says what she says, stay in the present focused on what she can change or control, understand that her anxious thoughts are "just thoughts" and are not necessarily going to play out in reality the way she might fear, decrease her feelings of inadequacy within the family, interrupt and decrease her overthinking and over analyzing, refrain from assuming worst-case scenarios, use of positive self talk, work more intentionally on having healthier boundaries within the family, be able to walk away from volatile situations when needed, and recognize the strength she shows to be able to work in a more focused manner on her goal-directed behaviors and move in a direction that supports her improved overall emotional health now and her outlook into the future.  Goal review and progress/challenges noted with patient.  Next appointment within 2 weeks.   Mathis Fare, LCSW

## 2023-04-06 ENCOUNTER — Ambulatory Visit: Payer: Medicare Other | Admitting: Behavioral Health

## 2023-04-06 ENCOUNTER — Encounter: Payer: Self-pay | Admitting: Behavioral Health

## 2023-04-06 DIAGNOSIS — F411 Generalized anxiety disorder: Secondary | ICD-10-CM

## 2023-04-06 DIAGNOSIS — F5105 Insomnia due to other mental disorder: Secondary | ICD-10-CM | POA: Diagnosis not present

## 2023-04-06 DIAGNOSIS — F331 Major depressive disorder, recurrent, moderate: Secondary | ICD-10-CM | POA: Diagnosis not present

## 2023-04-06 DIAGNOSIS — F99 Mental disorder, not otherwise specified: Secondary | ICD-10-CM | POA: Diagnosis not present

## 2023-04-06 MED ORDER — ALPRAZOLAM 0.5 MG PO TABS: 0.5000 mg | ORAL_TABLET | Freq: Two times a day (BID) | ORAL | 0 refills | Status: DC | PRN

## 2023-04-06 MED ORDER — TEMAZEPAM 30 MG PO CAPS: 30.0000 mg | ORAL_CAPSULE | Freq: Every day | ORAL | 1 refills | Status: DC

## 2023-04-06 MED ORDER — FLUOXETINE HCL 20 MG PO CAPS: 80.0000 mg | ORAL_CAPSULE | Freq: Every day | ORAL | 3 refills | Status: DC

## 2023-04-06 MED ORDER — BUSPIRONE HCL 15 MG PO TABS: ORAL_TABLET | ORAL | 1 refills | Status: DC

## 2023-04-06 MED ORDER — QUETIAPINE FUMARATE 100 MG PO TABS: 100.0000 mg | ORAL_TABLET | Freq: Every day | ORAL | 3 refills | Status: DC

## 2023-04-06 NOTE — Progress Notes (Signed)
 [facility]  Patient ID: Valerie [name],  MRN: [id number]  PCP: Rolinda Millman, MD  Date of Evaluation: 04/06/2023 Time spent:30 minutes  Chief Complaint:  Chief Complaint   Anxiety; Depression; Family Problem; Follow-up; Medication Refill; Patient Education     HISTORY/CURRENT STATUS: HPI  Valerie [name], 63 year old female presents to this office for follow up and medication management. Says that her medication regimen continues to be fine but acknowledges intermittent anxiety leading to panic. Believes anxiety is created more from poor communication with husband. Experiences panic attacks in the afternoons.  Not wanted to change medication but looking for something that could be added to help with the panic and anxiety. She continues in psychotherapy.   She continues to see Marval Bunde for therapy.  She says that her anxiety today is 4 of 10, and her depression is 1of 10.  She is sleeping 7-8 hours every night with the aid of Restoril . Sleep is broken, wakes up often. She denies any history of mania, no psychosis, no history of auditory or visual hallucinations.  She denies SI or HI.   Prior psychiatric medication trial: Prozac  Xanax  Temazepam  Ambien Mirtazapine  Trazodone Seroquel  Individual Medical History/ Review of Systems: Changes? :No   Allergies: Oxycodone, Oxycodone-acetaminophen , Penicillins, and Potassium  Current Medications:  Current Outpatient Medications:    busPIRone  (BUSPAR ) 15 MG tablet, Take 1/3 tablet p.o. twice daily for 1 week, then take 2/3 tablet p.o. twice daily for 1 week, then take 1 tablet p.o. twice daily, Disp: 60 tablet, Rfl: 1   ALPRAZolam  (XANAX ) 0.5 MG tablet, Take 1 tablet (0.5 mg total) by mouth 2 (two) times daily as needed., Disp: 180 tablet, Rfl: 0   docusate sodium  (COLACE) 100 MG capsule, Take 100 mg by mouth daily., Disp: , Rfl:    FLUoxetine  (PROZAC ) 20 MG capsule, Take 4 capsules (80 mg total) by mouth daily., Disp: 120  capsule, Rfl: 3   HYDROcodone -acetaminophen  (NORCO/VICODIN) 5-325 MG tablet, Take 1 tablet by mouth every 4 (four) hours as needed for up to 18 doses for moderate pain (pain score 4-6)., Disp: 18 tablet, Rfl: 0   ibuprofen (ADVIL) 400 MG tablet, Take 400 mg by mouth every 6 (six) hours as needed., Disp: , Rfl:    levothyroxine  (SYNTHROID ) 75 MCG tablet, Take 37.5-75 mcg by mouth See admin instructions. Take 37.5 mcg by mouth in the morning before breakfast on Sunday and 75 mcg on Mon/Tues/Wed/Thurs/Fri/Sat, Disp: , Rfl:    LINZESS  290 MCG CAPS capsule, Take 290 mcg by mouth every morning., Disp: , Rfl:    omeprazole (PRILOSEC) 40 MG capsule, Take 40 mg by mouth daily as needed (heartburn)., Disp: , Rfl:    oxybutynin  (DITROPAN  XL) 10 MG 24 hr tablet, Take 1 tablet (10 mg total) by mouth at bedtime., Disp: 30 tablet, Rfl: 0   QUEtiapine  (SEROQUEL ) 100 MG tablet, Take 1 tablet (100 mg total) by mouth at bedtime., Disp: 30 tablet, Rfl: 3   senna (SENOKOT) 8.6 MG TABS tablet, Take 2 tablets by mouth in the morning., Disp: , Rfl:    tamsulosin  (FLOMAX ) 0.4 MG CAPS capsule, Take 1 capsule (0.4 mg total) by mouth daily., Disp: 30 capsule, Rfl: 1   temazepam  (RESTORIL ) 30 MG capsule, Take 1 capsule (30 mg total) by mouth at bedtime., Disp: 90 capsule, Rfl: 1 Medication Side Effects: none  Family Medical/ Social History: Changes? No  MENTAL HEALTH EXAM:  There were no vitals taken for this visit.There is no height or weight on file to  calculate BMI.  General Appearance: Casual, Neat, and Well Groomed  Eye Contact:  Good  Speech:  Clear and Coherent  Volume:  Normal  Mood:  Anxious, Depressed, and Dysphoric  Affect:  Depressed and Anxious  Thought Process:  Coherent  Orientation:  Full (Time, Place, and Person)  Thought Content: Logical   Suicidal Thoughts:  No  Homicidal Thoughts:  No  Memory:  WNL  Judgement:  Good  Insight:  Good  Psychomotor Activity:  Normal  Concentration:   Concentration: Good  Recall:  Good  Fund of Knowledge: Good  Language: Good  Assets:  Desire for Improvement  ADL's:  Intact  Cognition: WNL  Prognosis:  Good    DIAGNOSES:    ICD-10-CM   1. Generalized anxiety disorder  F41.1 busPIRone  (BUSPAR ) 15 MG tablet    ALPRAZolam  (XANAX ) 0.5 MG tablet    FLUoxetine  (PROZAC ) 20 MG capsule    QUEtiapine  (SEROQUEL ) 100 MG tablet    2. Major depressive disorder, recurrent episode, moderate (HCC)  F33.1 FLUoxetine  (PROZAC ) 20 MG capsule    3. Insomnia due to other mental disorder  F51.05 QUEtiapine  (SEROQUEL ) 100 MG tablet   F99 temazepam  (RESTORIL ) 30 MG capsule      Receiving Psychotherapy: yes   RECOMMENDATIONS:   Greater than 50% of 30 min face to face time with patient was spent on counseling and coordination of care. We talked about her current stability. Discussed her afternoon panic attacks. Discussed increased anxiety that she relates to her increasingly strained communication with husband.  Still participating in psychotherapy. Still dealing with family stressors.  We agreed to; Discussed potential benefits, risks, and side effects of BuSpar .  Patient agrees to trial of BuSpar .  Will start BuSpar  15 mg 1/3 tablet twice daily for 1 week, then increase to 2/3 tablet twice daily for 1 week, then increase to 1 tablet twice daily for anxiety.  Will continue Xanax  0.5 mg twice daily as needed for severe anxiety To continue Prozac  80 mg daily.  Patient is taking four 20 mg tablets To continue Seroquel  100  mg at bedtime.  To continue temazepam  30 mg capsule at bedtime for sleep. Stopped  Mirtazapine  to 15 mg at bedtime.  Pt denies dx of prolonged QT interval. I discussed with her and she is not aware. To discuss with PCP next visit. We will report side effects or worsening symptoms promptly Provided emergency contact information We will follow-up in 3 months as required to continue medications. Discussed potential benefits, risk, and  side effects of benzodiazepines to include potential risk of tolerance and dependence, as well as possible drowsiness.  Advised patient not to drive if experiencing drowsiness and to take lowest possible effective dose to minimize risk of dependence and tolerance.  Discussed potential metabolic side effects associated with atypical antipsychotics, as well as potential risk for movement side effects. Advised pt to contact office if movement side effects occur.   Reviewed PDMP     Redell DELENA Pizza, NP

## 2023-04-15 ENCOUNTER — Ambulatory Visit: Payer: Medicare Other | Admitting: Psychiatry

## 2023-04-29 ENCOUNTER — Ambulatory Visit: Payer: Medicare Other | Admitting: Psychiatry

## 2023-04-29 DIAGNOSIS — F411 Generalized anxiety disorder: Secondary | ICD-10-CM | POA: Diagnosis not present

## 2023-04-29 NOTE — Progress Notes (Signed)
 [facility] Counselor/Therapist Progress Note  Patient ID: Valerie [name], MRN: [id number],    Date: 04/29/2023  Time Spent: 50 minutes   Treatment Type: Individual Therapy  Reported Symptoms: anxious, some anger and frustration within family and extended family (sister, 2 neices), unresolved grief but improving, tendency to regret saying what I feel    Mental Status Exam:  Appearance:   Casual and Neat     Behavior:  Appropriate, Sharing, and Motivated  Motor:  Normal  Speech/Language:   Clear and Coherent  Affect:  Depressed and anxious, frustration  Mood:  anxious and depressed  Thought process:  loose associations  Thought content:    Some rumination at times  Sensory/Perceptual disturbances:    WNL  Orientation:  oriented to person, place, time/date, situation, day of week, month of year, year, and stated date of Feb. 27, 2025  Attention:  Good  Concentration:  Good and Fair  Memory:  WNL  Fund of knowledge:   Good  Insight:    Good and Fair  Judgment:   Good and Fair  Impulse Control:  Good and Fair   Risk Assessment: Danger to Self:  No Self-injurious Behavior: No Danger to Others: No Duty to Warn:no Physical Aggression / Violence:No  Access to Firearms a concern: No  Gang Involvement:No   Subjective:  Patient in session today reporting symptoms of anger, frustration mostly within the family, ongoing mixed feelings of grief and anger, "trying to let go so that I can move forward and be more healthy".  Most of the anger, frustration, and resentment, and grief that she is working on relates to unresolved issues with her sister, 2 adult nieces, and patient's deceased mother. Today needing to work further on some healthier communication skills especially within family. Working on saying more clearly how she feels especially within her family and marital environment. Working in session today further on making her needs known, setting healthier limits with others  including husband. Focusing on responding in healthier ways with family members. Easy to misinterpret messages from others, especially within family or others with whom she is close. Communication issues continue and we focused more today on some listening and verbal challenges that she experiences particularly with husband and other family members.  Continues to work on not "jumping to conclusions" and healthier ways to manage her anger especially within the family relationships.  Continues to have some struggles around anger and hurt which we discussed more today and she feels she is making some progress on that.  As noted in last session, there is a lot of family dynamics that are rather entrenched within the family and am working with patient to not feel powerless and instead look at particular strategies she can work with that can help improve family relationships and communication.  Continues to work with thought interruptions and replacement, "not responding" unless needed in some cases, and trying to realize that her automatic assumptions are not always correct.  Continues to work on not Programmer, systems and instead looking more for her positives and seeing the strength within herself, confront the feelings of inadequacy that are not true and practice healthier communication skills in her relationships in and outside of the family.  Interventions: Cognitive Behavioral Therapy and Ego-Supportive  Long-term goal: Increase understanding of beliefs and messages that produce the worry and anxiety. Short-term goal: Reduce overall level, frequency, and intensity of the anxiety so that daily functioning is not impaired. Strategies: Patient to learn further about  anxiety re: daily skills for managing it, and to use the skills effectively in everyday life.   Diagnosis:   ICD-10-CM   1. Generalized anxiety disorder  F41.1      Plan:   Patient today showing good participation and motivation in session  focusing further on her anger, frustrations, hurt within the family, communication, and not assuming all of her assumptions are accurate.  She is showing increased effort and working on her symptoms and and receiving feedback. Reminded and encouraged patient in her practice of positive and self affirming behaviors as noted and practiced in sessions including: Challenge and counteract self-doubt, paying attention to how she says what she says, stay in the present focused on what she can change or control, decrease her feelings of inadequacy within the family, interrupt and decrease her overthinking and over analyzing, refrain from assuming worst-case scenarios, understand that her anxious thoughts are "just thoughts" and are not necessarily going to play out reality the way she might fear, use of positive self talk, working more intensely on having healthier boundaries within the family as needed, be able to walk away from volatile situations when needed, and realize the strength she shows to be able to work in a more focused manner on her goal-directed behaviors and move in a direction that supports her improved emotional health and her overall wellbeing.  This patient continues to make progress and needs to work further on her goal-directed behaviors in order to move in a healthier and more hopeful direction into the future.  Goal review and progress/challenges noted with patient.  Next appointment within 2 weeks.   Mathis Fare, LCSW

## 2023-05-04 DIAGNOSIS — N200 Calculus of kidney: Secondary | ICD-10-CM | POA: Diagnosis not present

## 2023-05-12 DIAGNOSIS — H524 Presbyopia: Secondary | ICD-10-CM | POA: Diagnosis not present

## 2023-05-13 ENCOUNTER — Ambulatory Visit: Payer: Medicare Other | Admitting: Psychiatry

## 2023-05-18 ENCOUNTER — Encounter: Payer: Self-pay | Admitting: Behavioral Health

## 2023-05-18 ENCOUNTER — Ambulatory Visit (INDEPENDENT_AMBULATORY_CARE_PROVIDER_SITE_OTHER): Payer: Medicare Other | Admitting: Behavioral Health

## 2023-05-18 DIAGNOSIS — F99 Mental disorder, not otherwise specified: Secondary | ICD-10-CM

## 2023-05-18 DIAGNOSIS — F411 Generalized anxiety disorder: Secondary | ICD-10-CM

## 2023-05-18 DIAGNOSIS — F5105 Insomnia due to other mental disorder: Secondary | ICD-10-CM | POA: Diagnosis not present

## 2023-05-18 DIAGNOSIS — F331 Major depressive disorder, recurrent, moderate: Secondary | ICD-10-CM

## 2023-05-18 MED ORDER — BUSPIRONE HCL 30 MG PO TABS
30.0000 mg | ORAL_TABLET | Freq: Two times a day (BID) | ORAL | 2 refills | Status: DC
Start: 2023-05-18 — End: 2023-07-22

## 2023-05-18 NOTE — Progress Notes (Signed)
 [facility]  Patient ID: Valerie [name],  MRN: [id number]  PCP: [name], MD  Date of Evaluation: 05/18/2023 Time spent:30 minutes  Chief Complaint:  Chief Complaint   Depression; Anxiety; Follow-up; Medication Refill; Patient Education; Family Problem     HISTORY/CURRENT STATUS: HPI  "Valerie [name]", 63 year old female presents to this office for follow up and medication management. Says that she is notices minimum effect from Buspar so far and does not feel any improvement with anxiety. Is requesting dose increase to see if the medication will be effective. Experiences panic attacks in the afternoons.   She continues in psychotherapy.   She continues to see Rockne Menghini for therapy.  She says that her anxiety today is 3 of 10, and her depression is 1of 10.  She is sleeping 7-8 hours every night with the aid of Restoril. Sleep is broken, wakes up often. She denies any history of mania, no psychosis, no history of auditory or visual hallucinations.  She denies SI or HI.   Prior psychiatric medication trial: Prozac Xanax Temazepam Ambien Mirtazapine Trazodone Seroquel     Individual Medical History/ Review of Systems: Changes? :No   Allergies: Oxycodone, Oxycodone-acetaminophen, Penicillins, and Potassium  Current Medications:  Current Outpatient Medications:    busPIRone (BUSPAR) 30 MG tablet, Take 1 tablet (30 mg total) by mouth 2 (two) times daily., Disp: 60 tablet, Rfl: 2   ALPRAZolam (XANAX) 0.5 MG tablet, Take 1 tablet (0.5 mg total) by mouth 2 (two) times daily as needed., Disp: 180 tablet, Rfl: 0   busPIRone (BUSPAR) 15 MG tablet, Take 1/3 tablet p.o. twice daily for 1 week, then take 2/3 tablet p.o. twice daily for 1 week, then take 1 tablet p.o. twice daily, Disp: 60 tablet, Rfl: 1   docusate sodium (COLACE) 100 MG capsule, Take 100 mg by mouth daily., Disp: , Rfl:    FLUoxetine (PROZAC) 20 MG capsule, Take 4 capsules (80 mg total) by mouth daily.,  Disp: 120 capsule, Rfl: 3   HYDROcodone-acetaminophen (NORCO/VICODIN) 5-325 MG tablet, Take 1 tablet by mouth every 4 (four) hours as needed for up to 18 doses for moderate pain (pain score 4-6)., Disp: 18 tablet, Rfl: 0   ibuprofen (ADVIL) 400 MG tablet, Take 400 mg by mouth every 6 (six) hours as needed., Disp: , Rfl:    levothyroxine (SYNTHROID) 75 MCG tablet, Take 37.5-75 mcg by mouth See admin instructions. Take 37.5 mcg by mouth in the morning before breakfast on Sunday and 75 mcg on Mon/Tues/Wed/Thurs/Fri/Sat, Disp: , Rfl:    LINZESS 290 MCG CAPS capsule, Take 290 mcg by mouth every morning., Disp: , Rfl:    omeprazole (PRILOSEC) 40 MG capsule, Take 40 mg by mouth daily as needed (heartburn)., Disp: , Rfl:    oxybutynin (DITROPAN XL) 10 MG 24 hr tablet, Take 1 tablet (10 mg total) by mouth at bedtime., Disp: 30 tablet, Rfl: 0   QUEtiapine (SEROQUEL) 100 MG tablet, Take 1 tablet (100 mg total) by mouth at bedtime., Disp: 30 tablet, Rfl: 3   senna (SENOKOT) 8.6 MG TABS tablet, Take 2 tablets by mouth in the morning., Disp: , Rfl:    tamsulosin (FLOMAX) 0.4 MG CAPS capsule, Take 1 capsule (0.4 mg total) by mouth daily., Disp: 30 capsule, Rfl: 1   temazepam (RESTORIL) 30 MG capsule, Take 1 capsule (30 mg total) by mouth at bedtime., Disp: 90 capsule, Rfl: 1 Medication Side Effects: none  Family Medical/ Social History: Changes? No  MENTAL HEALTH EXAM:  There were  no vitals taken for this visit.There is no height or weight on file to calculate BMI.  General Appearance: Casual, Neat, and Well Groomed  Eye Contact:  Good  Speech:  Clear and Coherent  Volume:  Normal  Mood:  Anxious  Affect:  Appropriate  Thought Process:  Disorganized  Orientation:  Full (Time, Place, and Person)  Thought Content: Logical   Suicidal Thoughts:  No  Homicidal Thoughts:  No  Memory:  WNL  Judgement:  Good  Insight:  Good  Psychomotor Activity:  Normal  Concentration:  Concentration: Good  Recall:  Good   Fund of Knowledge: Good  Language: Good  Assets:  Desire for Improvement  ADL's:  Intact  Cognition: WNL  Prognosis:  Good    DIAGNOSES:    ICD-10-CM   1. Generalized anxiety disorder  F41.1 busPIRone (BUSPAR) 30 MG tablet    2. Major depressive disorder, recurrent episode, moderate (HCC)  F33.1     3. Insomnia due to other mental disorder  F51.05    F99       Receiving Psychotherapy: No    RECOMMENDATIONS:   Greater than 50% of 30 min face to face time with patient was spent on counseling and coordination of care. We talked about her current stability. Discussed her trial of Buspar. Reports not working well.   Still participating in psychotherapy. Still dealing with family stressors.  We agreed to; To increase Buspar to 30 mg twice daily, 8 hours between dosing.  Will continue Xanax 0.5 mg twice daily as needed for severe anxiety To continue Prozac 80 mg daily.  Patient is taking four 20 mg tablets To continue Seroquel 100  mg at bedtime.  To continue temazepam 30 mg capsule at bedtime for sleep. Stopped  Mirtazapine to 15 mg at bedtime.  Pt denies dx of prolonged QT interval. I discussed with her and she is not aware. To discuss with PCP next visit. We will report side effects or worsening symptoms promptly Provided emergency contact information We will follow-up in 3 months as required to continue medications. Discussed potential benefits, risk, and side effects of benzodiazepines to include potential risk of tolerance and dependence, as well as possible drowsiness.  Advised patient not to drive if experiencing drowsiness and to take lowest possible effective dose to minimize risk of dependence and tolerance.  Discussed potential metabolic side effects associated with atypical antipsychotics, as well as potential risk for movement side effects. Advised pt to contact office if movement side effects occur.   Reviewed PDMP    Joan Flores, NP

## 2023-05-20 ENCOUNTER — Ambulatory Visit: Admitting: Psychiatry

## 2023-05-20 DIAGNOSIS — F411 Generalized anxiety disorder: Secondary | ICD-10-CM

## 2023-05-20 NOTE — Progress Notes (Signed)
 [facility] Counselor/Therapist Progress Note  Patient ID: Valerie [name], MRN: [id number],    Date: 05/20/2023  Time Spent: 53 minutes   Treatment Type: Individual Therapy  Reported Symptoms:  anxious, some anger and frustration within extended family, unresolved grief and difficulty accepting mother's death, some loneliness   Mental Status Exam:  Appearance:   Neat     Behavior:  Appropriate, Sharing, and Motivated  Motor:  Normal  Speech/Language:   Clear and Coherent  Affect:  Depressed and Tearful  Mood:  Anxiety, depressed  Thought process:  normal  Thought content:    Rumination  Sensory/Perceptual disturbances:    WNL  Orientation:  oriented to person, place, time/date, situation, day of week, month of year, year, and stated date of May 20, 2023  Attention:  Fair  Concentration:  Fair  Memory:  WNL  Fund of knowledge:   Good  Insight:    Good and Fair  Judgment:   Good and Fair  Impulse Control:  Good and Fair   Risk Assessment: Danger to Self:  No Self-injurious Behavior: No Danger to Others: No Duty to Warn:no Physical Aggression / Violence:No  Access to Firearms a concern: No  Gang Involvement:No   Subjective:    Today worked with patient on: Feeling insignificant, inadequate, and not a priority to my family. Feels "taken advantage of" and wants to stop that. Working to resolve my grief and "put it aside" Healthier communication and understanding within the family.  Working to better manage her anxiety.  Stating today that anxiety is her main symptom.  "Wanting to be healthier emotionally but hard to escape my past and hard to believe in herself now, wonders if it's too late in the process for her to get better. Discussed this more today and worked more intentionally on her self-esteem. Sense of inadequacy persists and talked through this more today. Worked on Recruitment consultant, ways of talking with neighbor where there are increasing issues  onsite and patient is wanting to talk to him to give him a chance to know them but not to create hard feelings.  (Not all details included in this note due to patient privacy needs).  Patient did do well and speaking on each of the areas noted above that we discussed at the beginning of session and she seemed to notice a difference in her being able to be more focused and intentional and working in our session today by outlining what we were going to focus on.  Easy for her to get distracted when there is not specifically stated focus and she noticed today how it went better for her and she was able to talk and vent but also received better some suggestions that can be helpful to her in managing her stressors and working further on her goals.  Continues to experience frustration, anger, and resentment, along with some grief regarding some unresolved issues with others outside the family including a sister and 2 adult nieces and patient's deceased mother.  Trying to state her needs better.  Setting healthier limits with other people including family members.  Focusing more on her communication and how she says what she says in relationships.  Trying to not jump to conclusions.  Trying to be a better listener and interacting with others especially under stress.  A lot of of entrenched family dynamics.  Encouraged to "not self sabotage in situations" and to continue looking for the strengths within herself and others, not assuming the negatives  and instead look for more positives, confront her feelings of inadequacy that are not based in truth, and be aware of how she communicates with others and try practicing healthier communication skills both within the family and outside the family.  Plan to role-play some communication issues with patient next session.  Interventions: Cognitive Behavioral Therapy and Ego-Supportive  Long-term goal: Increase understanding of beliefs and messages that produce the worry and  anxiety. Short-term goal: Reduce overall level, frequency, and intensity of the anxiety so that daily functioning is not impaired. Strategies: Patient to learn further about anxiety re: daily skills for managing it, and to use the skills effectively in everyday life.   Diagnosis:   ICD-10-CM   1. Generalized anxiety disorder  F41.1      Plan:   Patient working today showing good motivation and active participation as she works more to better manage and decrease her anger, frustrations, hurt within the family, improve her communication, and not assuming that all of her negative and fearful thoughts are accurate.  She does continue to show increased effort working on her goals and receiving feedback and processing that as well.  Reminded and encouraged patient in her practice of positive and self affirming behaviors as noted and practiced in sessions including: Challenge and counteract her self-doubt, pay attention to how she says what she says, stay in the present focused on what she can change her control, decrease her feelings of inadequacy within the family, interrupt and decrease her overthinking and over analyzing, refrain from assuming worst-case scenarios, understand that her anxious thoughts are "just thoughts" and are not necessarily going to play out in reality the way she might fear, use of positive self talk, working more intentionally on having healthier boundaries within the family as needed, be able to walk away from volatile situations when needed, and realize the strength she shows to be able to work in a more focused manner on her goal-directed behaviors and move in a direction that supports her improved emotional health and her outlook into the future.  Laneka continues to make progress and needs to work further on her goal-directed behaviors so as to keep moving and a healthier and more hopeful direction.  Goal review and progress/challenges noted with patient.  Next appointment  within 2 weeks.   Mathis Fare, LCSW

## 2023-05-27 ENCOUNTER — Ambulatory Visit (INDEPENDENT_AMBULATORY_CARE_PROVIDER_SITE_OTHER): Payer: Medicare Other | Admitting: Psychiatry

## 2023-05-27 DIAGNOSIS — F411 Generalized anxiety disorder: Secondary | ICD-10-CM

## 2023-05-27 NOTE — Progress Notes (Signed)
 [facility] Counselor/Therapist Progress Note  Patient ID: Valerie [name], MRN: [id number],    Date: 05/27/2023  Time Spent: 55 minutes   Treatment Type: Individual Therapy  Reported Symptoms: Anxious, frustration, some anger, some lingering grief that is not quite resolved   Mental Status Exam:  Appearance:   Casual     Behavior:  Appropriate, Sharing, and Motivated  Motor:  Normal  Speech/Language:   Clear and Coherent  Affect:  anxious  Mood:  anxious  Thought process:  goal directed  Thought content:    Rumination  Sensory/Perceptual disturbances:    WNL  Orientation:  oriented to person, place, time/date, situation, day of week, month of year, year, and stated date of May 27, 2023  Attention:  Fair  Concentration:  Good and Fair  Memory:  WNL  Fund of knowledge:   Good  Insight:    Good and Fair  Judgment:   Good and Fair  Impulse Control:  Good and Fair   Risk Assessment: Danger to Self:  No Self-injurious Behavior: No Danger to Others: No Duty to Warn:no Physical Aggression / Violence:No  Access to Firearms a concern: No  Gang Involvement:No   Subjective:   Patient picking up from last session and today working further on her anxiety and remarked how good she felt about last session as she was more focused and worked more intentionally on goal directed behaviors. Earlier this week had unexpected confrontation with a new neighbor outside of their apartments and it went very negative. Needed to process this today as it has really negatively impacted patient and she has had increase in her anxiety and frustration. Talked through her feelings at length and discussed better ways of interacting with others when there are differences, and Also choosing not to fuel a negative situation with another person and be able to give them some time and space to think things through rather than over-apologizing which she has a history of doing in relationships and it has  created problems for her. Leaves tomorrow to go see son and is nervous about that due to relationship issues in family. Processed this with patient acknowledging her main concerns and we talked about strategies that she can use to better communicate in stressful situations and to not "go looking for things to go badly." Review of points stated below:  Feeling insignificant, inadequate, and not a priority to my family. Feels "taken advantage of" and wants to stop that. Working to resolve my grief and "put it aside" Healthier communication and understanding within the family.  Working to better manage her anxiety.  Patient again today working on being more goal focused which did not start off that way in the session but she was able to regroup and do a much better job and staying on track with her goals and what we are trying to help her accomplish.  Anxiety remains her primary symptom and that is what she is wanting to continue focusing on right now.  Think that this will also help her elevate her self-esteem.  Still struggling with sense of inadequacy.  Reviewed self calming skills today and ways of talking to people where we might be heard better.  Trying to state her needs better.  Working on healthier limits for herself with other people including close and distant family members.  Also continue to work on "how she says what she says" and how this affects her communication with family and beyond.  Encouraged her to try  to interrupt her habit of jumping to conclusions.  Focusing more on her listening and interacting with others under stress.  Working to let go of some negative family dynamics.  Encouraged her to continue looking for her strengths and not just pointing out her weaknesses, to not "self sabotage" in relationships, looking for more positives than negatives, and being very aware of her communication with others and in between sessions practice some of the healthier communication skills that we  have covered in session today.  Interventions: Cognitive Behavioral Therapy, Solution-Oriented/Positive Psychology, and Ego-Supportive  Long-term goal: Increase understanding of beliefs and messages that produce the worry and anxiety. Short-term goal: Reduce overall level, frequency, and intensity of the anxiety so that daily functioning is not impaired. Strategies: Patient to learn further about anxiety re: daily skills for managing it, and to use the skills effectively in everyday life.   Diagnosis:   ICD-10-CM   1. Generalized anxiety disorder  F41.1      Plan:   Patient today in session motivated and actively participating as she continues working on her anxiety, communication, anger, frustrations, hurt within the family, and not assuming all of her negative and fearful thoughts are accurate.  Encouraged patient to continue working on her positive and self affirming behaviors as noted and practiced in sessions including: Challenge and counteract her self-doubt, pay attention to how she says what she says, stay in the present focused on what she can change or control, decrease her feelings of inadequacy within the family, interrupt and decrease her overthinking and over analyzing, refrain from assuming worst-case scenarios, understand that her anxious thoughts are "just thoughts" and are not necessarily going to play out in reality the way she might fear, use of positive self talk, working more intentionally on having healthier boundaries within the family as needed, be able to walk away from volatile situations when needed, and recognize the strength she shows working in a more focused manner on her goal-directed behaviors and move in a direction that supports her improved emotional health and her overall wellbeing.  Valerie [name] does continue to make progress and needs to work further on her goal-directed behaviors in order to keep moving and a more hopeful and healthier direction.  Goal  review and progress/challenges noted with patient.  Next appointment within 2 weeks.   Valerie Fare, LCSW

## 2023-06-07 ENCOUNTER — Telehealth: Payer: Self-pay | Admitting: Behavioral Health

## 2023-06-07 NOTE — Telephone Encounter (Signed)
 Just have her continue as prescribed until her next appointment. We cannot increase xanax. Very situational.

## 2023-06-07 NOTE — Telephone Encounter (Signed)
 Patient notified to continue taking medication as prescribed.

## 2023-06-07 NOTE — Telephone Encounter (Signed)
 Pt reporting that she was taking Buspar 30 mg - 2 tablets BID.  The previous Rx was for 15 mg taper BID. She reports taking it incorrectly for 6 days. She asks what it should help with and I told her anxiety. She reports there is no improvement in her anxiety, that she had a PA last night and had to get up and take Xanax. She is having issues with a new neighbor that is creating increased stress.   She wants to know if she should skip the medication or just start taking as prescribed.

## 2023-06-07 NOTE — Telephone Encounter (Signed)
 Pt called at 9:35a.  She has been taking 2 30mg  Buspar twice a day instead of 1 30mg  twice a day.  She found out when she called the pharmacy to get a refill.  She said she thought she was still taking the 15mg  pill, so that was why she was taking two pills.  She said she's not sure if she should take 1 today and 1 tonight or skip today altogether since she's been over-dosing.  She said even though she's been taking more, she said her anxiety is still the same.  She said what is it supposed to help with?  She does not need a refill at this time if she starts to take the correct dosage.  Pls call to discuss.

## 2023-06-08 ENCOUNTER — Ambulatory Visit: Payer: Medicare Other | Admitting: Psychiatry

## 2023-06-08 DIAGNOSIS — F411 Generalized anxiety disorder: Secondary | ICD-10-CM

## 2023-06-08 NOTE — Progress Notes (Signed)
 [facility] Counselor/Therapist Progress Note  Patient ID: Valerie [name], MRN: [id number],    Date: 06/08/2023  Time Spent: 55 minutes  Treatment Type: Individual Therapy  Reported Symptoms:  anxious, agitation but "not thoughts of any harm to self or others", frustration, some depression, irritability, panic attack since last appt., upset over recent confrontation again with neighbors   Mental Status Exam:  Appearance:   Well Groomed     Behavior:  Appropriate, Sharing, and Motivated  Motor:  Normal  Speech/Language:   Clear and Coherent  Affect:  anxious  Mood:  anxious, irritable, and some depression  Thought process:  goal directed  Thought content:    Rumination  Sensory/Perceptual disturbances:    WNL  Orientation:  oriented to person, place, time/date, situation, day of week, month of year, year, and stated date of June 08, 2023  Attention:  Good  Concentration:  Good and Fair  Memory:  WNL  Fund of knowledge:   Good  Insight:    Good and Fair  Judgment:   Good and Fair  Impulse Control:  Fair and Poor   Risk Assessment: Danger to Self:  No Self-injurious Behavior: No Danger to Others: No Duty to Warn:no Physical Aggression / Violence:No  Access to Firearms a concern: No  Gang Involvement:No   Subjective:   Patient today in session and continuing her work from previous session that she felt helped her shift her focus in some ways that was helpful.  Shared some positives she gained from last session, and some positives and challenges this past 1-2 wks. "Cautious because I don't want to stir up anything with anybody, esp within family and also with neighbors with whom they are currently having negative interactions. Shared more difficulty about she and husband's "communication including not really listening and over-exaggerating". Admits "I don't let go of things that have hurt me"; doesn't see that as much of a problem.Very difficult to not feel that "I always  understand what is said and meant and cannot accept that I may have misunderstood." Worked on this more in session today including interrupting negative assumptions and practice the "letting go" which is difficult for patient but seems to be gaining some understanding of her need to "let go" more.  Continues to work on overthinking, impulsively responding in negative ways, and not giving herself time enough to really think things through before responding.  Good motivation and good work in session today.  Gaining insight and also finding it hard to see things in different ways and be able to respond in different ways.  Reviewed some particular points again with patient including: Ways of responding point I feel insignificant, and adequate, and not a priority to the other person, working with the feelings of "being taken advantage of", working to resolve my grief and move/grow beyond it, use of healthier communication and understanding especially with my family where they all have problems and communication, and how this impacts patient's anxiety.  Interventions: Cognitive Behavioral Therapy and Ego-Supportive  Long-term goal: Increase understanding of beliefs and messages that produce the worry and anxiety. Short-term goal: Reduce overall level, frequency, and intensity of the anxiety so that daily functioning is not impaired. Strategies: Patient to learn further about anxiety re: daily skills for managing it, and to use the skills effectively in everyday life.   Diagnosis:   ICD-10-CM   1. Generalized anxiety disorder  F41.1       Plan:   Patient working in session  today on her frustrations, anxiety, communication, anger, hurt within the family, trying not to assume that her negative and fearful thoughts are totally accurate, working with her sense of inadequacy and lower self-esteem.  Anxiety does continue to be patient's primary symptom, closely followed by the above named symptoms in this  section of her note.  Does feel that the work she is doing is helping to elevate her self-esteem although "it feels slow".  I supported patient in her current feelings but also redirected her to look at the journey she has been on thus far and how she has worked diligently on her goals especially more recently and has noticed more positives.  Does still struggle with her sense of inadequacy.  Worked with patient again on some self calming skills, communication skills to work on speaking and ways where she might be better heard by others, working with specific examples.  Also helping patient learn to state her needs better, paying attention to "how she says what she says" and have a better understanding of how this all impacts her communication with family and others.  Continue to encourage patient to interrupt her habit of jumping to conclusions and making assumptions.   Encouraged to look for the strength of herself and others and not over-focus on weaknesses as this tends to be self sabotaging.  Also encouraged patient in challenging and counteracting her self-doubt, pay attention to how she says what she says, stay in the present focused on what she can change her control, interrupt and decrease her overthinking and over analyzing, refrain from assuming worst-case scenarios, understand that her anxious thoughts are "just thoughts" and are not necessarily going to play out in reality the way she might fear, use of positive self talk, working more intentionally on having healthier boundaries within the family as needed, be able to walk away from volatile situations when needed, and realizes strength she shows working in a more focused manner on her goal-directed behaviors to move in a direction that supports her improved emotional health and outlook into the future.  Valerie [name] has made progress and is being encouraged to work further on her goal-directed behaviors to keep moving in a more hopeful and  healthier direction into the future.  Goal review and progress/challenges noted with patient.  Next appointment within 2 weeks.   Mathis Fare, LCSW

## 2023-06-22 ENCOUNTER — Ambulatory Visit: Payer: Medicare Other | Admitting: Psychiatry

## 2023-06-22 DIAGNOSIS — F411 Generalized anxiety disorder: Secondary | ICD-10-CM | POA: Diagnosis not present

## 2023-06-22 NOTE — Progress Notes (Signed)
 [facility] Counselor/Therapist Progress Note  Patient ID: Valerie [name], MRN: [id number],    Date: 06/22/2023  Time Spent: 55 minutes   Treatment Type: Individual Therapy  Reported Symptoms: anxiety, agitation but no thoughts to harm other, frustration, some depression, irritability, panic attacks when thinking about certain family stressors, some continued issues with neighbors "but more so with adult son and family"   Mental Status Exam:  Appearance:   Casual     Behavior:  Appropriate, Sharing, and Motivated  Motor:  Normal  Speech/Language:   Clear and Coherent  Affect:  Depressed and anxious  Mood:  anxious, depressed, and irritable  Thought process:  goal directed  Thought content:    Ruminating  Sensory/Perceptual disturbances:    WNL  Orientation:  oriented to person, place, time/date, situation, day of week, month of year, year, and stated date of June 22, 2023  Attention:  Good  Concentration:  Good and Fair  Memory:  WNL  Fund of knowledge:   Good and Fair  Insight:    Good and Fair  Judgment:   Good and Fair  Impulse Control:  Good and Fair   Risk Assessment: Danger to Self:  No Self-injurious Behavior: No Danger to Others: No Duty to Warn:no Physical Aggression / Violence:No  Access to Firearms a concern: No  Gang Involvement:No   Subjective:   Patient working further today in session, again shifting her focus on things that she can change versus cannot change which is proven to be helpful the last couple sessions, but hard for patient to maintain. Working to better manage and reduce her anxiety, depression, frustration, irritability, hurt, sad, and some panic attacks but not recently. We acknowledged areas in patient's life where she is needing to make some changes, especially within family relationships. Discussed these in detail in session today, especially relationship with son and daughter-in-law and the manipulation and disrespect. Worked on  appropriate boundaries and ways of practicing them more regularly, along with improved marital communication, and setting healthier limits with others in the family.  Patient to follow through and some homework assignments regarding topics discussed today as noted above.  Interventions: Cognitive Behavioral Therapy, Solution-Oriented/Positive Psychology, and Ego-Supportive  Long-term goal: Increase understanding of beliefs and messages that produce the worry and anxiety. Short-term goal: Reduce overall level, frequency, and intensity of the anxiety so that daily functioning is not impaired. Strategies: Patient to learn further about anxiety re: daily skills for managing it, and to use the skills effectively in everyday life.    Diagnosis:   ICD-10-CM   1. Generalized anxiety disorder  F41.1      Plan: Patient working hard in session today on her difficult issues with anxiety, anger, frustrations, communication, especially within her marriage and family.  Is gaining more insight into relationship and family issues and her tendency to hold onto the negatives that block her from moving forward and working on some positives.  Continues to struggle with her sense of inadequacy.  To work further on some calming skills and speaking in ways that she can be better heard and understood, and also working to let go of things in the past that cannot be changed now.  Is showing some improvement in her insight today and to follow through on some homework given at end of session. Encouraged patient to look more for the strength within herself and others and not overly focused on the negatives or weaknesses because this almost always leads her to self  sabotage.  To continue working on counteracting her self-doubt, paying attention to how she says what she says, stay in the present focused on what she can change or control, interrupt and decrease her overthinking and over analyzing, refrain from assuming worst-case  scenarios, understand that her anxious thoughts are "just thoughts" and are not necessarily going to play out in reality the way she might fear, use of positive self talk, working more intentionally on having healthier boundaries within the family, be able to walk away from volatile situations, and recognize the strength she shows working with goal-directed behaviors in a more focused manner so as to move in a direction that supports her improved emotional health and outlook into the future.  Valerie [name] continues to make progress and I encouraged her to work further on her goal-directed behaviors to keep moving in a healthier and more positive direction.  Goal review and progress/challenges noted with patient.  Next appointment within 2 weeks.   Valerie Patee, LCSW

## 2023-06-26 IMAGING — RF DG UGI W/ HIGH DENSITY W/O KUB
10 series · 14 of 24 positions shown · non-contrast
Comparison: None.

CLINICAL DATA: History of sleeve gastrectomy and LINX procedure,
now with heartburn and food sticking in the lower chest.

EXAM:
UPPER GI SERIES WITH KUB
TECHNIQUE: After obtaining a scout radiograph a routine upper GI series was
performed using thin and high density barium.
FLUOROSCOPY TIME:  Fluoroscopy Time:  3 minutes 42 seconds
Radiation Exposure Index (if provided by the fluoroscopic device):
24 mGy
Number of Acquired Spot Images: 8

[Series 1: one shot · 0.14mm/px · 1 of 1 slices shown (1 of 5)]
[im 1/1]
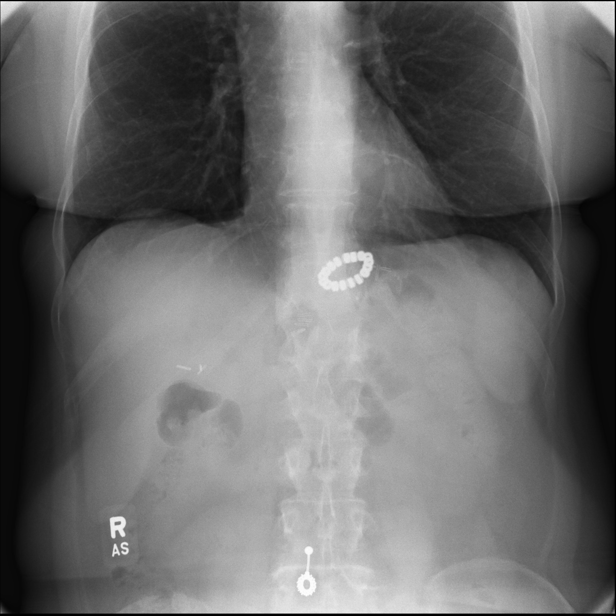

[Series 2: sequence · 1 of 24 frames shown (1 of 5)]
[frame 13/24]
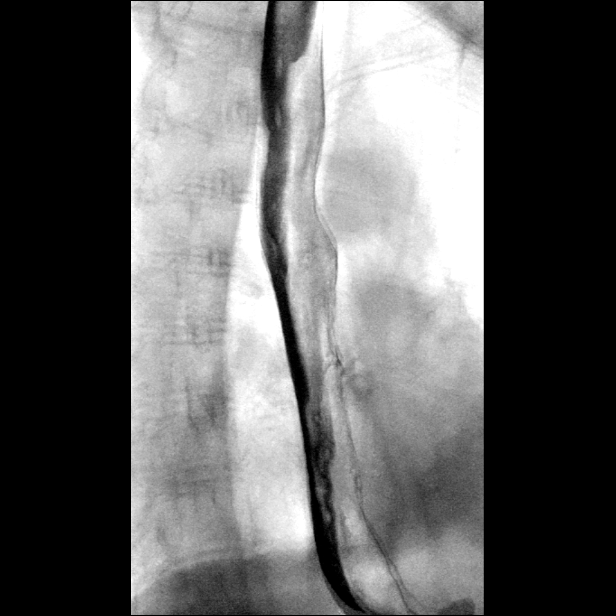

[Series 3: one shot · 0.16mm/px · 1 of 3 slices shown (2 of 5)]
[im 3/3]
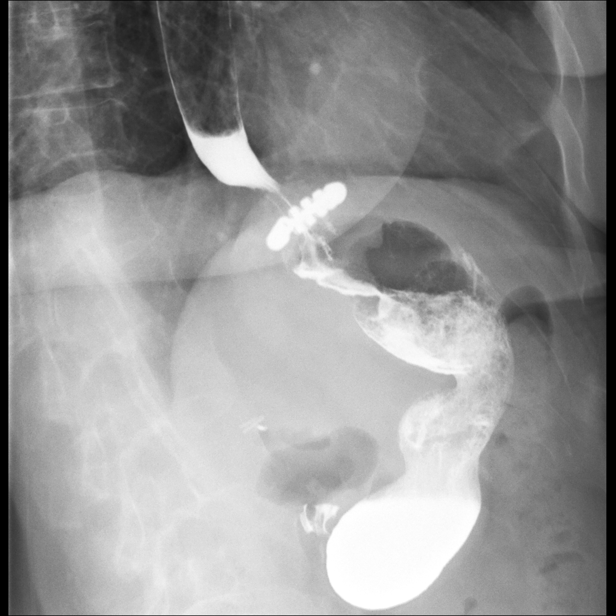

[Series 4: sequence · 1 of 26 frames shown (2 of 5)]
[frame 14/26]
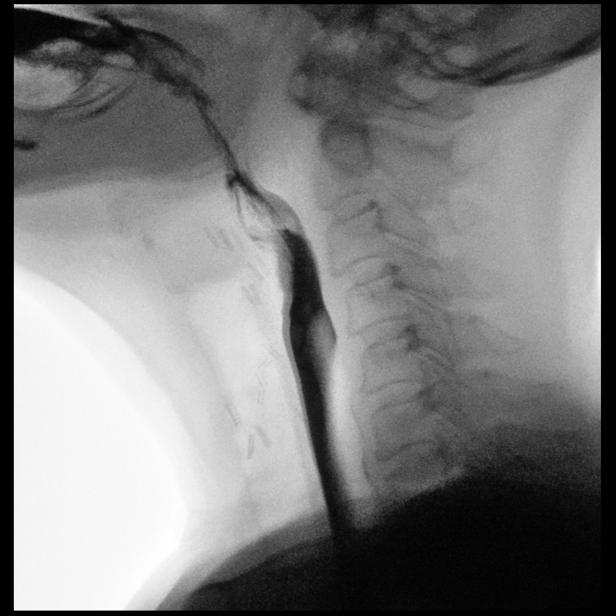

[Series 5: sequence · 2 of 27 frames shown (3 of 5)]
[frame 1/27]
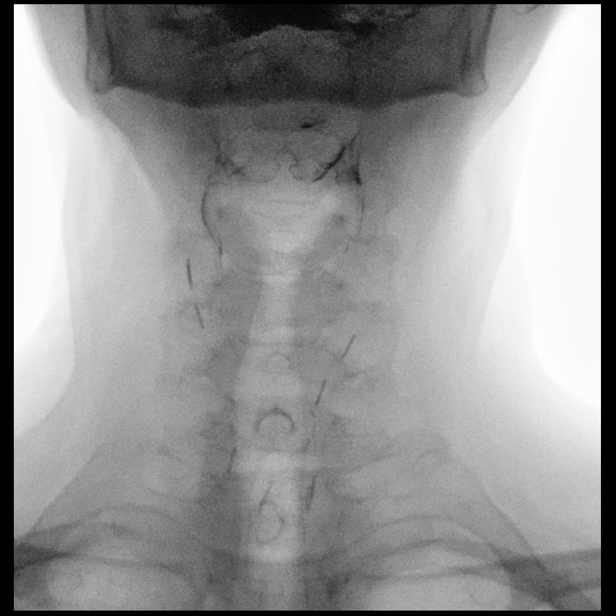
[frame 23/27]
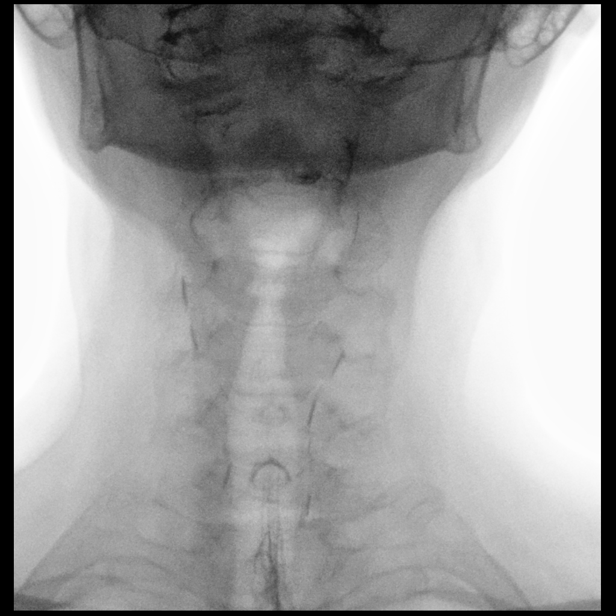

[Series 6: one shot · 0.16mm/px · 2 of 7 slices shown (3 of 5)]
[im 3/7]
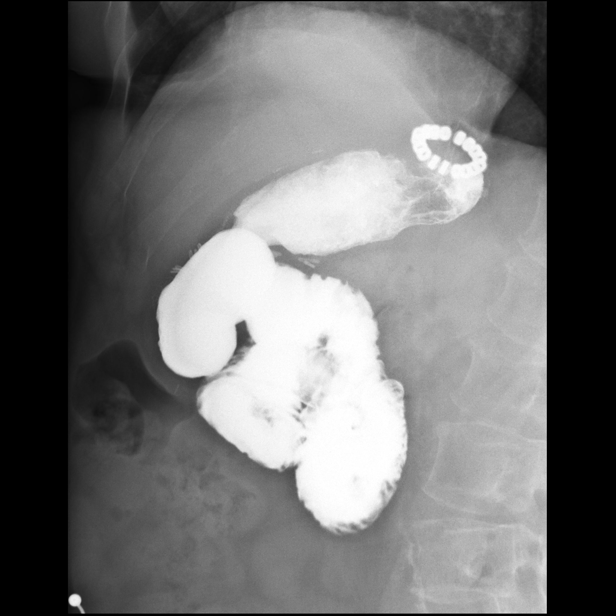
[im 5/7]
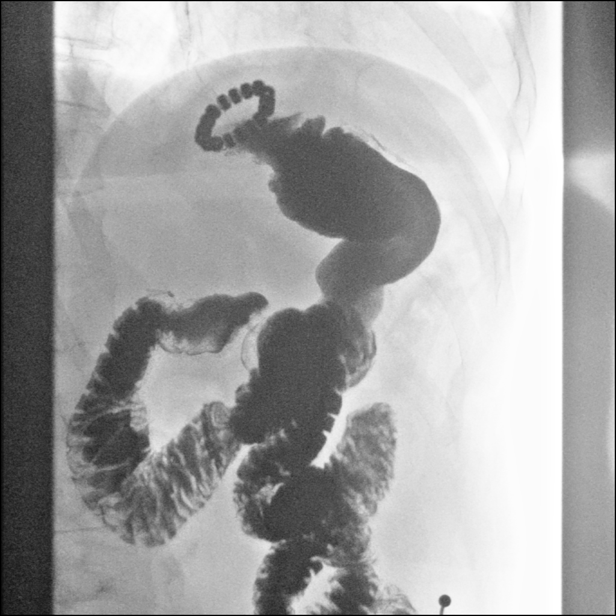

[Series 7: sequence · 1 of 53 frames shown (4 of 5)]
[frame 8/53]
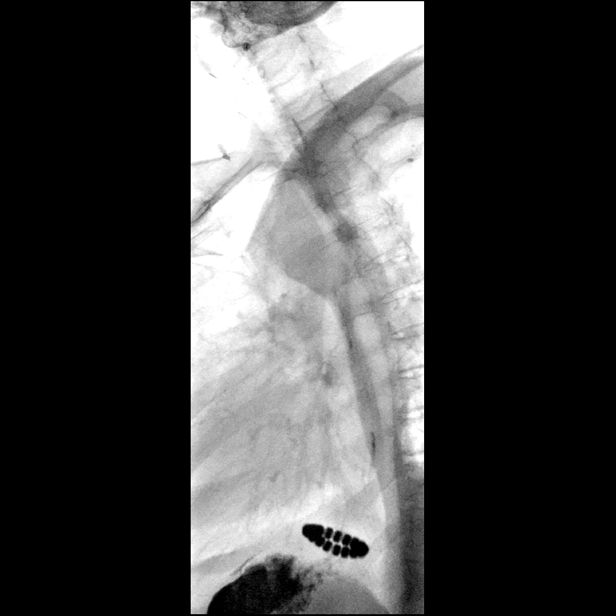

[Series 8: one shot · 3 of 7 slices shown (4 of 5)]
[im 1/7]
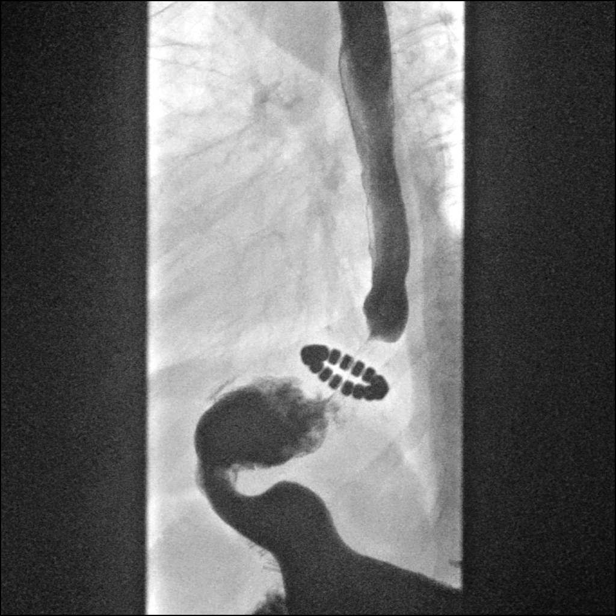
[im 5/7]
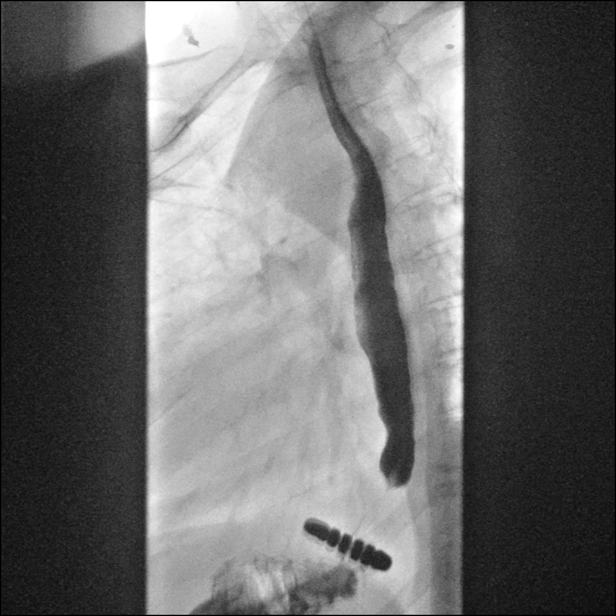
[im 7/7]
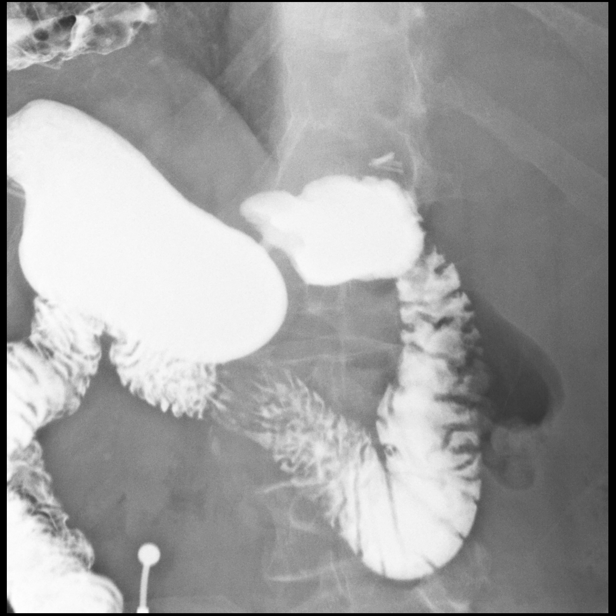

[Series 9: sequence · 1 of 54 frames shown (5 of 5)]
[frame 46/54]
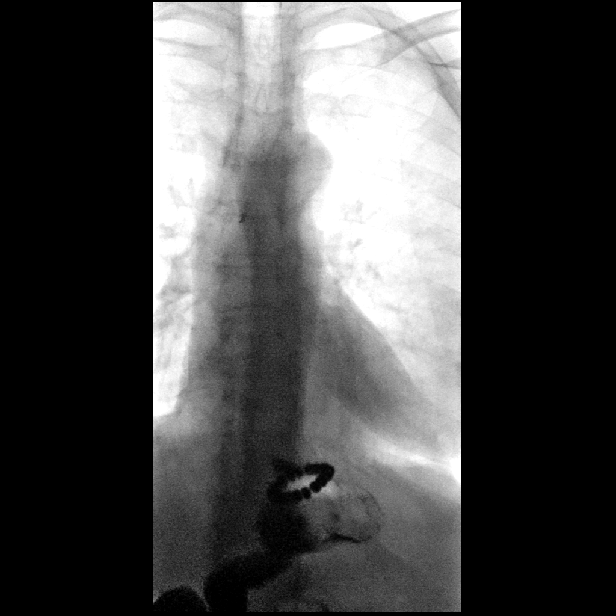

[Series 10: one shot · 0.15mm/px · 1 of 3 slices shown (5 of 5)]
[im 3/3]
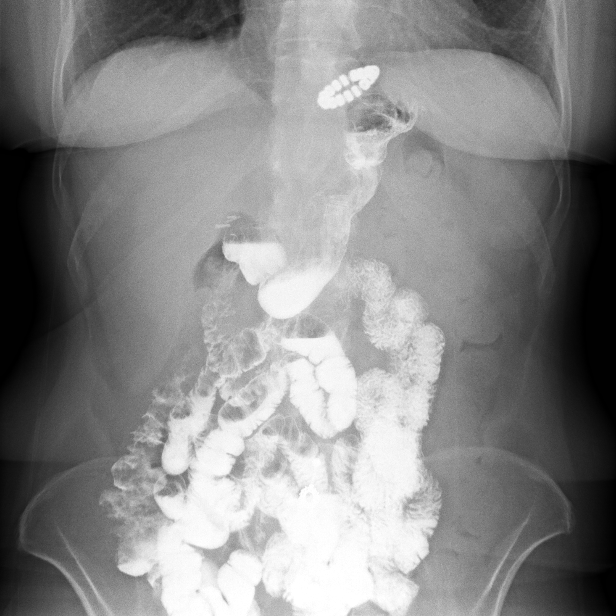

[14 of 24 positions shown; findings below may reference images not displayed]

FINDINGS: Scout radiograph demonstrates metallic ring at the esophagogastric
junction with surrounding surgical clips. Surgical clips in the
right upper quadrant of the abdomen. No dilated small bowel loops.
Mild colonic stool. No evidence of pneumatosis or pneumoperitoneum.
No radiopaque nephrolithiasis. Clear lung bases.

Mild to moderate esophageal dysmotility characterized by
intermittent weakening of primary peristalsis throughout the
thoracic esophagus. No hiatal hernia. No gastroesophageal reflux
elicited despite provocative maneuvers including water siphon test
and Valsalva maneuver. Normal esophageal distensibility with no
evidence of esophageal mass, ulcer or stricture. Normal esophageal
mucosa with no evidence of reflux esophagitis. A swallowed 13 mm
barium tablet traversed the esophagus into the stomach without
significant delay. Expected postsurgical changes from sleeve
gastrectomy. No gastric leak or fistula. No gastric fold thickening,
filling defects, ulcers or significant erosions. Normal gastric
emptying. Normal duodenal bulb and C sweep, with no evidence of
duodenal fold thickening, filling defects, ulcers or strictures.
Normal duodenal jejunal junction to the left of the spine.
Visualized jejunal loops are normal caliber and without fold
thickening.
IMPRESSION: 1. Expected postsurgical changes from LINX procedure and sleeve
gastrectomy. No hiatal hernia. No gastroesophageal reflux elicited.
2. Mild-to-moderate esophageal dysmotility, characteristic of
chronic reflux related dysmotility.
3. No evidence of masses, strictures or peptic ulcer disease.

## 2023-07-07 ENCOUNTER — Ambulatory Visit (INDEPENDENT_AMBULATORY_CARE_PROVIDER_SITE_OTHER): Admitting: Psychiatry

## 2023-07-07 DIAGNOSIS — F411 Generalized anxiety disorder: Secondary | ICD-10-CM | POA: Diagnosis not present

## 2023-07-07 NOTE — Progress Notes (Signed)
 [facility] Counselor/Therapist Progress Note  Patient ID: Valerie [name], MRN: [id number],    Date: 07/07/2023  Time Spent: 55 minutes   Treatment Type: Individual Therapy  Reported Symptoms: anxiety, agitation but no thoughts to harm anyone, frustration, depression, irritability, some panic attacks re: family stressors and patient imagining what's bad is going to happen hours ahead, self-critical!,  Assuming worst-case scenarios, allowing others to mistreat her and not have appropriate boundaries   Mental Status Exam:  Appearance:   Casual and Neat     Behavior:  Appropriate, Sharing, and Motivated  Motor:  Normal  Speech/Language:   Clear and Coherent  Affect:  Depressed and anxious  Mood:  anxious and depressed  Thought process:  goal directed  Thought content:    Rumination  Sensory/Perceptual disturbances:    WNL  Orientation:  oriented to person, place, time/date, situation, day of week, month of year, year, and stated date of Jul 07, 2023  Attention:  Good  Concentration:  Good  Memory:  WNL  Fund of knowledge:   Good  Insight:    Good and Fair  Judgment:   Good and Fair  Impulse Control:  Good and Fair   Risk Assessment: Danger to Self:  No Self-injurious Behavior: No Danger to Others: No Duty to Warn:no Physical Aggression / Violence:No  Access to Firearms a concern: No  Gang Involvement:No   Subjective:  Patient working well today and was encouraged from the start to be more focused (as she was in past session) on the things that she can change versus cannot change, which was very helpful for her.  She did acknowledge the it was difficult for her to maintain.   Panic attacks some decreased but it "depends on the circumstances."  Describes her thought processes more and how she responds when things don't go the way she prefers, and her tendency to over-focus on "when things go wrong" and also "I start to think about what may go wrong, several hours ahead."   Worked today on more clear boundaries and being able to say "no" to others as needed. Needing to recognize "red flags" in relationships particularly in the family. (Not all details included in this note due to patient and family privacy needs.) boundaries continue to be very hard for patient within her family and we discussed this further in session today including using specific examples and how she might respond in healthier ways.  Acknowledges how boundary issues pop up in several areas of patient's life and how it always leads to her feeling manipulated and disrespected.  Working further on practicing more appropriate boundaries, better communication and setting of limits with others in the family.  She is to follow through on homework related to this subject and will see again within 1 to 2 weeks.  Interventions: Cognitive Behavioral Therapy, Solution-Oriented/Positive Psychology, and Ego-Supportive  Long-term goal: Increase understanding of beliefs and messages that produce the worry and anxiety. Short-term goal: Reduce overall level, frequency, and intensity of the anxiety so that daily functioning is not impaired. Strategies: Patient to learn further about anxiety re: daily skills for managing it, and to use the skills effectively in everyday life.  Diagnosis:   ICD-10-CM   1. Generalized anxiety disorder  F41.1      Plan: Patient today in session focusing more on her anxiety, communication skills, anger, frustrations, mostly within her marriage and family setting.  Gaining more insight but not very quickly.  Very entrenched in certain negative  ways of viewing herself and not being able to set good limits with other people.  Motivation is some better today.  Encouraged patient in looking more for the strength within herself and not overly focused on what she perceives as her negatives or weaknesses as this leads to self sabotage for her, continue her work on counteracting self-doubt, paying  attention to how she says what she says, stay in the present focused on what she can change or control, interrupt and decrease her overthinking and overanalyzing, refrain from assuming worst-case scenarios, understand that her anxious thoughts are "just thoughts" and are not necessarily going to play out in reality the way she might fear, use of positive self talk, working more intentionally on having healthier boundaries within the family, be able to walk away from volatile situations, and realize the strength she shows working with goal-directed behaviors in a more focused way so as to move in a direction that supports her improved emotional health and her overall wellbeing.  Valerie [name] has made progress and I am encouraging her to keep working on her goal-directed behaviors to keep moving in a more healthy and positive direction.  Goal review and progress/challenges noted with patient.  Next appointment within 2 weeks.   Valerie Patee, LCSW

## 2023-07-14 ENCOUNTER — Ambulatory Visit: Admitting: Psychiatry

## 2023-07-22 ENCOUNTER — Encounter: Payer: Self-pay | Admitting: Behavioral Health

## 2023-07-22 ENCOUNTER — Ambulatory Visit (INDEPENDENT_AMBULATORY_CARE_PROVIDER_SITE_OTHER): Admitting: Behavioral Health

## 2023-07-22 ENCOUNTER — Ambulatory Visit: Admitting: Psychiatry

## 2023-07-22 DIAGNOSIS — F411 Generalized anxiety disorder: Secondary | ICD-10-CM | POA: Diagnosis not present

## 2023-07-22 DIAGNOSIS — F430 Acute stress reaction: Secondary | ICD-10-CM

## 2023-07-22 DIAGNOSIS — F410 Panic disorder [episodic paroxysmal anxiety] without agoraphobia: Secondary | ICD-10-CM

## 2023-07-22 DIAGNOSIS — F5105 Insomnia due to other mental disorder: Secondary | ICD-10-CM

## 2023-07-22 DIAGNOSIS — F99 Mental disorder, not otherwise specified: Secondary | ICD-10-CM

## 2023-07-22 DIAGNOSIS — F331 Major depressive disorder, recurrent, moderate: Secondary | ICD-10-CM

## 2023-07-22 MED ORDER — ALPRAZOLAM 0.5 MG PO TABS: 0.5000 mg | ORAL_TABLET | Freq: Two times a day (BID) | ORAL | 1 refills | Status: AC | PRN

## 2023-07-22 MED ORDER — QUETIAPINE FUMARATE 100 MG PO TABS: 100.0000 mg | ORAL_TABLET | Freq: Every day | ORAL | 3 refills | Status: DC

## 2023-07-22 MED ORDER — TEMAZEPAM 30 MG PO CAPS: 30.0000 mg | ORAL_CAPSULE | Freq: Every day | ORAL | 1 refills | Status: DC

## 2023-07-22 MED ORDER — FLUOXETINE HCL 20 MG PO CAPS: 80.0000 mg | ORAL_CAPSULE | Freq: Every day | ORAL | 3 refills | Status: DC

## 2023-07-22 MED ORDER — BUSPIRONE HCL 30 MG PO TABS: 30.0000 mg | ORAL_TABLET | Freq: Two times a day (BID) | ORAL | 2 refills | Status: DC

## 2023-07-22 MED ORDER — PROPRANOLOL HCL 10 MG PO TABS: 10.0000 mg | ORAL_TABLET | Freq: Three times a day (TID) | ORAL | 1 refills | Status: DC

## 2023-07-22 NOTE — Progress Notes (Signed)
 Valerie [name]

## 2023-07-22 NOTE — Progress Notes (Signed)
 [facility] Med Check  Patient ID: Valerie [name],  MRN: [id number]  PCP: Dorena Gander, MD  Date of Evaluation: 07/22/2023 Time spent:30 minutes  Chief Complaint:  Chief Complaint   Depression; Anxiety; Follow-up; Medication Refill; Patient Education; Medication Problem; Family Problem     HISTORY/CURRENT STATUS: HPI  "Valerie [name]", 63 year old female presents to this office for follow up and medication management. Says that she is notices minimum effect from Buspar  so far and does not feel any improvement with anxiety. Is requesting another medication that may help calm her during social settings. Says that she "gets worked up even if its someone coming over for dinner". Experiences panic  in the afternoons.  She continues in psychotherapy.   She continues to see Reid Capuchin for therapy.  She says that her anxiety today is 3 of 10, and her depression is 1of 10.  She is sleeping 7-8 hours every night with the aid of Restoril . Sleep is broken, wakes up often. She denies any history of mania, no psychosis, no history of auditory or visual hallucinations.  She denies SI or HI.   Prior psychiatric medication trial: Prozac  Xanax  Temazepam  Ambien Mirtazapine  Trazodone Seroquel     Individual Medical History/ Review of Systems: Changes? :No   Allergies: Oxycodone, Oxycodone-acetaminophen , Penicillins, and Potassium  Current Medications:  Current Outpatient Medications:    propranolol (INDERAL) 10 MG tablet, Take 1 tablet (10 mg total) by mouth 3 (three) times daily., Disp: 90 tablet, Rfl: 1   ALPRAZolam  (XANAX ) 0.5 MG tablet, Take 1 tablet (0.5 mg total) by mouth 2 (two) times daily as needed., Disp: 180 tablet, Rfl: 1   busPIRone  (BUSPAR ) 15 MG tablet, Take 1/3 tablet p.o. twice daily for 1 week, then take 2/3 tablet p.o. twice daily for 1 week, then take 1 tablet p.o. twice daily, Disp: 60 tablet, Rfl: 1   busPIRone  (BUSPAR ) 30 MG tablet, Take 1 tablet (30 mg total) by mouth 2  (two) times daily., Disp: 60 tablet, Rfl: 2   docusate sodium  (COLACE) 100 MG capsule, Take 100 mg by mouth daily., Disp: , Rfl:    FLUoxetine  (PROZAC ) 20 MG capsule, Take 4 capsules (80 mg total) by mouth daily., Disp: 120 capsule, Rfl: 3   HYDROcodone -acetaminophen  (NORCO/VICODIN) 5-325 MG tablet, Take 1 tablet by mouth every 4 (four) hours as needed for up to 18 doses for moderate pain (pain score 4-6)., Disp: 18 tablet, Rfl: 0   ibuprofen (ADVIL) 400 MG tablet, Take 400 mg by mouth every 6 (six) hours as needed., Disp: , Rfl:    levothyroxine  (SYNTHROID ) 75 MCG tablet, Take 37.5-75 mcg by mouth See admin instructions. Take 37.5 mcg by mouth in the morning before breakfast on Sunday and 75 mcg on Mon/Tues/Wed/Thurs/Fri/Sat, Disp: , Rfl:    LINZESS  290 MCG CAPS capsule, Take 290 mcg by mouth every morning., Disp: , Rfl:    omeprazole (PRILOSEC) 40 MG capsule, Take 40 mg by mouth daily as needed (heartburn)., Disp: , Rfl:    oxybutynin  (DITROPAN  XL) 10 MG 24 hr tablet, Take 1 tablet (10 mg total) by mouth at bedtime., Disp: 30 tablet, Rfl: 0   QUEtiapine  (SEROQUEL ) 100 MG tablet, Take 1 tablet (100 mg total) by mouth at bedtime., Disp: 30 tablet, Rfl: 3   senna (SENOKOT) 8.6 MG TABS tablet, Take 2 tablets by mouth in the morning., Disp: , Rfl:    tamsulosin  (FLOMAX ) 0.4 MG CAPS capsule, Take 1 capsule (0.4 mg total) by mouth daily., Disp: 30 capsule, Rfl: 1  temazepam  (RESTORIL ) 30 MG capsule, Take 1 capsule (30 mg total) by mouth at bedtime., Disp: 90 capsule, Rfl: 1 Medication Side Effects: none  Family Medical/ Social History: Changes? No  MENTAL HEALTH EXAM:  There were no vitals taken for this visit.There is no height or weight on file to calculate BMI.  General Appearance: Casual, Neat, and Well Groomed  Eye Contact:  Good  Speech:  Clear and Coherent  Volume:  Normal  Mood:  NA  Affect:  Appropriate  Thought Process:  Coherent  Orientation:  Full (Time, Place, and Person)   Thought Content: Logical   Suicidal Thoughts:  No  Homicidal Thoughts:  No  Memory:  WNL  Judgement:  Good  Insight:  Good  Psychomotor Activity:  Normal  Concentration:  Concentration: Good  Recall:  Good  Fund of Knowledge: Good  Language: Good  Assets:  Desire for Improvement  ADL's:  Intact  Cognition: WNL  Prognosis:  Good    DIAGNOSES:    ICD-10-CM   1. Generalized anxiety disorder  F41.1 propranolol (INDERAL) 10 MG tablet    ALPRAZolam  (XANAX ) 0.5 MG tablet    QUEtiapine  (SEROQUEL ) 100 MG tablet    FLUoxetine  (PROZAC ) 20 MG capsule    busPIRone  (BUSPAR ) 30 MG tablet    2. Major depressive disorder, recurrent episode, moderate (HCC)  F33.1 FLUoxetine  (PROZAC ) 20 MG capsule    3. Insomnia due to other mental disorder  F51.05 QUEtiapine  (SEROQUEL ) 100 MG tablet   F99 temazepam  (RESTORIL ) 30 MG capsule    4. Panic attack as reaction to stress  F41.0 propranolol (INDERAL) 10 MG tablet   F43.0       Receiving Psychotherapy: No    RECOMMENDATIONS:   Greater than 50% of 30 min face to face time with patient was spent on counseling and coordination of care. We talked about her current stability. Discussed her trial of Buspar . Reports not working well.  She was requesting trial of another medication that may help with  increased anxiety and panic in social settings. Discussed medication options as well as side effect precautions.   Still participating in psychotherapy. Still dealing with family stressors.  We agreed to; To start Propranolol 10 mg three times daily as needed for anxiety or panic  To continue Buspar  to 30 mg twice daily, 8 hours between dosing.  Will continue Xanax  0.5 mg twice daily as needed for severe anxiety To continue Prozac  80 mg daily.  Patient is taking four 20 mg tablets To continue Seroquel  100  mg at bedtime.  To continue temazepam  30 mg capsule at bedtime for sleep.  Pt denies dx of prolonged QT interval. I discussed with her and she is not  aware. To discuss with PCP next visit. We will report side effects or worsening symptoms promptly Provided emergency contact information We will follow-up in 3 months as required to continue medications. Discussed potential benefits, risk, and side effects of benzodiazepines to include potential risk of tolerance and dependence, as well as possible drowsiness.  Advised patient not to drive if experiencing drowsiness and to take lowest possible effective dose to minimize risk of dependence and tolerance.  Discussed potential metabolic side effects associated with atypical antipsychotics, as well as potential risk for movement side effects. Advised pt to contact office if movement side effects occur.   Reviewed PDMP      Lincoln Renshaw, NP

## 2023-07-22 NOTE — Progress Notes (Signed)
 [facility] Counselor/Therapist Progress Note  Patient ID: Valerie [name], MRN: [id number],    Date: 07/22/2023  Time Spent: 55 minutes  Treatment Type: Individual Therapy  Reported Symptoms: anxiety, agitation without thougths of harming anyone, frustration, depression, irritability, panic attacks re: family stressors and in public/private situation, imagines the negatives "that might happen", assumes worst case scenarios, allows others to mistreat her and not have appropriate boundaries   Mental Status Exam:  Appearance:   Casual and Neat     Behavior:  Appropriate, Sharing, and Motivated  Motor:  Normal  Speech/Language:   Clear and Coherent  Affect:  Depressed and anxious  Mood:  anxious and depressed  Thought process:  goal directed  Thought content:    Rumination  Sensory/Perceptual disturbances:    WNL  Orientation:  oriented to person, place, time/date, situation, day of week, month of year, year, and stated date of Jul 22, 2023  Attention:  Good  Concentration:  Fair  Memory:  WNL  Fund of knowledge:   Good  Insight:    Good and Fair  Judgment:   Good and Fair  Impulse Control:  Good and Fair   Risk Assessment: Danger to Self:  No Self-injurious Behavior: No Danger to Others: No Duty to Warn:no Physical Aggression / Violence:No  Access to Firearms a concern: No  Gang Involvement:No   Subjective:    Patient today in session and actively participating as she picked up from last session trying to focus more on the "things that she can change" and also being able to recognize some of the changes she is working harder to make, although difficult due to past history.  Is putting forth more effort as coached in sessions to be and remain more focused particularly discerning what things she can change versus the things she cannot change which has been more helpful for her, although difficult to maintain. Working to feel better about herself without such harsh/rigid  treatment of herself. Was heavier when younger and tends to be more hard on herself now. Hard for her to untangle from the control of husband and other family, and talked through this more today, role-playing specific examples and being able to demonstrate for patient ways of having healthier boundaries and ways of how in situations when she feels others are mistreating her.  Her history has been to allow the mistreatment and just get hurt by it, but she has began to show strength now of confronting the issue more and trying to set healthier boundaries.  Admits she tends to think about what might go wrong versus what might go right.  Also seeing some "red flags" in relationships mostly within the family and committed to working on these.  Is to practice some boundary work and also some journaling in between sessions.  Also realizing how some of these situations are connected with her panic attacks and plan to follow-up on this at next session.  Patient showing increased effort more recently, today included.  Seems to be believing in herself more. . Interventions: Cognitive Behavioral Therapy, Solution-Oriented/Positive Psychology, and Ego-Supportive  Long-term goal: Increase understanding of beliefs and messages that produce the worry and anxiety. Short-term goal: Reduce overall level, frequency, and intensity of the anxiety so that daily functioning is not impaired. Strategies: Patient to learn further about anxiety re: daily skills for managing it, and to use the skills effectively in everyday life.    Diagnosis:   ICD-10-CM   1. Generalized anxiety disorder  F41.1      Plan:   Patient showing good effort in session today working further on her anxiety, anger, frustrations, communication skills, a lot of which her within her marriage and family setting.  Her insight is increasing in some situations although not necessarily quickly.  It is hard for her to get out of that feeling of being very  entrenched in certain negative ways of viewing herself, viewing others and situations, and having continued difficulty setting limits with other people. Reminded and encouraged patient to look more for the strength within herself and not overly focused on what she perceives as her negatives or weaknesses as this leads to self sabotage for her, continue her work on counteracting self-doubt, pay attention to how she says what she says, stay in the present focused on what she can change or control, interrupt and decrease her overthinking and over analyzing, refrain from assuming worst-case scenarios, understand that her anxious thoughts are "just thoughts" and are not necessarily going to play out in reality the way she might fear, use of positive self talk, working more intentionally on having healthier boundaries within the family, be able to walk away from volatile situations, and recognize the strengths she shows as she works with goal-directed behaviors in a more focused way so as to move in a direction that supports her overall improved emotional health and outlook into the future.  Dianalynn Nand has made progress and was encouraged in session today to keep working on her goal-directed behaviors moving her in a healthier and more positive direction.  Goal review and progress/challenges noted with patient.  Next appointment within 2 weeks.   Kelleen Patee, LCSW

## 2023-07-27 DIAGNOSIS — E559 Vitamin D deficiency, unspecified: Secondary | ICD-10-CM | POA: Diagnosis not present

## 2023-07-27 DIAGNOSIS — E538 Deficiency of other specified B group vitamins: Secondary | ICD-10-CM | POA: Diagnosis not present

## 2023-07-27 DIAGNOSIS — E7800 Pure hypercholesterolemia, unspecified: Secondary | ICD-10-CM | POA: Diagnosis not present

## 2023-07-27 DIAGNOSIS — E890 Postprocedural hypothyroidism: Secondary | ICD-10-CM | POA: Diagnosis not present

## 2023-07-27 DIAGNOSIS — Z0000 Encounter for general adult medical examination without abnormal findings: Secondary | ICD-10-CM | POA: Diagnosis not present

## 2023-07-27 DIAGNOSIS — M810 Age-related osteoporosis without current pathological fracture: Secondary | ICD-10-CM | POA: Diagnosis not present

## 2023-07-27 DIAGNOSIS — Z79899 Other long term (current) drug therapy: Secondary | ICD-10-CM | POA: Diagnosis not present

## 2023-08-11 ENCOUNTER — Ambulatory Visit: Admitting: Psychiatry

## 2023-08-11 DIAGNOSIS — F411 Generalized anxiety disorder: Secondary | ICD-10-CM

## 2023-08-11 DIAGNOSIS — R944 Abnormal results of kidney function studies: Secondary | ICD-10-CM | POA: Diagnosis not present

## 2023-08-11 DIAGNOSIS — R748 Abnormal levels of other serum enzymes: Secondary | ICD-10-CM | POA: Diagnosis not present

## 2023-08-11 NOTE — Progress Notes (Signed)
 [facility] Counselor/Therapist Progress Note  Patient ID: Valerie [name], MRN: [id number],    Date: 08/11/2023  Time Spent: 55 minutes   Treatment Type: Individual Therapy  Reported Symptoms:  anxiety, agitation without thoughts of harming anyone, frustration, depression, irritability, family stressors, looking for more peace and less confrontation in family, practicing saying no as needed, trying not to assume worst case scenarios, allows others to mistreat her but is starting to improve some   Mental Status Exam:  Appearance:   Casual and Neat     Behavior:  Appropriate, Sharing, and Motivated  Motor:  Normal  Speech/Language:   Clear and Coherent  Affect:  Anxious, some depression  Mood:  anxious and depressed  Thought process:  goal directed  Thought content:    WNL  Sensory/Perceptual disturbances:    WNL  Orientation:  oriented to person, place, time/date, situation, day of week, month of year, year, and stated date of August 11, 2023  Attention:  Good  Concentration:  Good and Fair  Memory:  WNL  Fund of knowledge:   Good  Insight:    Good and Fair  Judgment:   Good  Impulse Control:  Good and Fair   Risk Assessment: Danger to Self:  No Self-injurious Behavior: No Danger to Others: No Duty to Warn:no Physical Aggression / Violence:No  Access to Firearms a concern: No  Gang Involvement:No   Subjective:   Patient in session today participating well and showing more motivation as the session progressed. Sharing how she did follow through on some suggestions that have been made in recent sessions re: standing up for herself more and having clear boundaries. This is a significant step for patient and she processed this at length in session today. Discussed more proactive ways for patient to respond to family members especially husband. Verbal conflicts continue and helping patient today look at ways she tends to feed disagreements/arguments. Tendency to assume  the worst in situations and working on this further today, using specific examples. Long history of communication difficulties within marriage and family and is taking this more seriously at this point and showing more initative and efforts to make changes as discussed in sessions. Patient today is stressed also about some medical testing coming up later today and processed her concerns in session today. Trying to focus more on what I can change versus cannot change. Coaching patient in session today to recognize ways she self-sabotages and also some to the new strength and insight she is experiencing as evidenced in sessions.  Working on not assuming the worst and not looking for the worst in people nor situations. Working believe more in herself and ability to make positive changes and create healthier boundaries. More belief in herself noticed today.   Interventions: Cognitive Behavioral Therapy, Solution-Oriented/Positive Psychology, and Ego-Supportive Long-term goal: Increase understanding of beliefs and messages that produce the worry and anxiety. Short-term goal: Reduce overall level, frequency, and intensity of the anxiety so that daily functioning is not impaired. Strategies: Patient to learn further about anxiety re: daily skills for managing it, and to use the skills effectively in everyday life.   Diagnosis:   ICD-10-CM   1. Generalized anxiety disorder  F41.1      Plan:  Patient today in session showing more motivation as she continued working on her issues particularly within her marriage and family, her anger, anxiety, frustrations, communication skills, motivation, and believing in herself.  Insight is increasing some and was more noticeable today.  Does admit that is hard changing certain habits that have been very entrenched with patient for a long time.  Also challenging to try and be less negative and viewing herself and others, but has definitely shown more effort most  recently and today in session. Encouraged patient to be looking more for the strength within herself and not overly focused on what she perceives as her weaknesses or negatives as this tends to lead her to self sabotage more frequently, continue her work on counteracting self-doubt, pay attention to how she says what she says, stay in the present focused on what she can change her control, interrupt and decrease her overthinking and over analyzing, refrain from assuming worst-case scenarios, understand that her anxious thoughts are just thoughts and are not necessarily going to play out in reality the way she might fear, use of positive self talk, working more intentionally on having healthier boundaries within the family, be able to walk away from volatile situations, and realize the strength she shows working with goal-directed behaviors in a more focused way so as to move in a direction that supports her overall improved emotional health and wellbeing.  Valerie [name] is making progress and is committed to continuing her work on goal-directed behaviors moving in a more positive and healthier direction into the future.  Goal review and progress/challenges noted with patient.  Next appointment within 2 weeks.   Kelleen Patee, LCSW

## 2023-08-25 ENCOUNTER — Ambulatory Visit: Admitting: Psychiatry

## 2023-08-25 DIAGNOSIS — F411 Generalized anxiety disorder: Secondary | ICD-10-CM | POA: Diagnosis not present

## 2023-08-25 NOTE — Progress Notes (Signed)
 [facility] Counselor/Therapist Progress Note  Patient ID: Valerie [name], MRN: [id number],    Date: 08/25/2023  Time Spent: 53 minutes   Treatment Type: Individual Therapy  Reported Symptoms: anxiety, some depression, agitation, frustration, irritability, family stressors, looking for more peace and less confrontation in family, getting used very easily by some of her family, upset today re: family issues , needing more peace but things in family are worse    Mental Status Exam:  Appearance:   Casual and Neat     Behavior:  Sharing, Agitated, and Motivated  Motor:  Normal  Speech/Language:   Clear and Coherent  Affect:  Depressed and anxious  Mood:  anxious, depressed, and irritable  Thought process:  goal directed  Thought content:    Rumination  Sensory/Perceptual disturbances:    WNL  Orientation:  oriented to person, place, time/date, situation, day of week, month of year, year, and stated date of June 225, 2025  Attention:  Good  Concentration:  Good  Memory:  WNL  Fund of knowledge:   Good  Insight:    Good and Fair  Judgment:   Good and Fair  Impulse Control:  Good and Fair   Risk Assessment: Danger to Self:  No Self-injurious Behavior: No Danger to Others: No Duty to Warn:no Physical Aggression / Violence:No  Access to Firearms a concern: No  Gang Involvement:No   Subjective:   Patient working and participating well in session today but notes that she is anxious about upcoming month at [employer] and discussed family situations she is concerned about.  Feels she has slid backwards some in working on her goal for therapy and shared this as we discussed and problem-solved about her setting limits with her son and his wife as she travels back to their home tonight.  (Not all details included in this note due to patient privacy needs.) patient feeling like she has lost some of the gains she has made and we processed more of this in session today and help  patient to see that she has not lost everything but rather this is a challenging time and situation that she is going through and ways she can respond that might feel more self caring and good boundary setting on her part and actually role-played that in session today.  Needing to stand up more for herself and have clear boundaries.  Further discussed patient being more proactive and responding to family members and ways that supports her boundaries.  Trying to refrain from assuming worst-case scenarios.  Realizes there is a long history of communication difficulties within her marriage and family and feels that she has slipped backwards and better managing her communication within the family.  Focusing more on what I can change rather than what I cannot change.  Continue to coach patient today in session on her recognition of when she is being self sabotage and also ways that she can manage the situations with strength, good insight, and good judgment.  Trying to believe more in herself as she works hard on healthier boundaries and positive changes in her life.   Interventions: Cognitive Behavioral Therapy, Solution-Oriented/Positive Psychology, and Ego-Supportive Long-term goal: Increase understanding of beliefs and messages that produce the worry and anxiety. Short-term goal: Reduce overall level, frequency, and intensity of the anxiety so that daily functioning is not impaired. Strategies: Patient to learn further about anxiety re: daily skills for managing it, and to use the skills effectively in everyday life.  Diagnosis:  ICD-10-CM   1. Generalized anxiety disorder  F41.1      Plan: Patient in session today working more on her own self-esteem, not being able to stand up for herself, setting limits with people who tend to use her, not setting herself up to be used, and working further on improved communication skills, better managing her anxiety and anger and frustrations, and believing  in herself.  Lots of family issues going on right now that were discussed particularly relating to patient and her communication with them and setting healthy boundaries. Reminded and encouraged patient to be looking more for strength within herself and not overly focused on what she perceives to be her weaknesses or negatives because this always tends to lead her to self sabotage more frequently, continue working on counteracting her self-doubt, pay attention to how she says what she says, stay in the present focused on what she can change or control, interrupt and decrease her overthinking and over analyzing, refrain from assuming worst-case scenarios, understand that her anxious thoughts are just thoughts and are not necessarily going to play out in reality the way she might fear, use of positive self talk, working more intentionally on having healthier boundaries within the family, be able to walk away from volatile situations, and recognize the strength she shows working with goal-directed behaviors in a more focused way send was to move in a direction that supports her overall improved emotional health and wellbeing.  Valerie [name] is showing progress and she is committed to continuing her work on goal-directed behaviors to help move her in a more positive and healthier direction going forward.  Goal review and progress/challenges noted with patient.  Next appointment within 2 weeks.   Barnie Bunde, LCSW

## 2023-09-01 ENCOUNTER — Other Ambulatory Visit: Payer: Self-pay | Admitting: Behavioral Health

## 2023-09-01 DIAGNOSIS — F410 Panic disorder [episodic paroxysmal anxiety] without agoraphobia: Secondary | ICD-10-CM

## 2023-09-01 DIAGNOSIS — F411 Generalized anxiety disorder: Secondary | ICD-10-CM

## 2023-09-08 ENCOUNTER — Ambulatory Visit: Admitting: Psychiatry

## 2023-09-17 ENCOUNTER — Other Ambulatory Visit: Payer: Self-pay | Admitting: Behavioral Health

## 2023-09-17 DIAGNOSIS — F410 Panic disorder [episodic paroxysmal anxiety] without agoraphobia: Secondary | ICD-10-CM

## 2023-09-17 DIAGNOSIS — F411 Generalized anxiety disorder: Secondary | ICD-10-CM

## 2023-09-21 ENCOUNTER — Other Ambulatory Visit: Payer: Self-pay

## 2023-09-21 DIAGNOSIS — F410 Panic disorder [episodic paroxysmal anxiety] without agoraphobia: Secondary | ICD-10-CM

## 2023-09-21 DIAGNOSIS — F411 Generalized anxiety disorder: Secondary | ICD-10-CM

## 2023-09-21 MED ORDER — PROPRANOLOL HCL 10 MG PO TABS: 10.0000 mg | ORAL_TABLET | Freq: Three times a day (TID) | ORAL | 0 refills | Status: DC

## 2023-09-22 ENCOUNTER — Ambulatory Visit: Admitting: Psychiatry

## 2023-10-06 ENCOUNTER — Ambulatory Visit: Admitting: Psychiatry

## 2023-10-11 ENCOUNTER — Other Ambulatory Visit: Payer: Self-pay

## 2023-10-11 ENCOUNTER — Ambulatory Visit: Admitting: Psychiatry

## 2023-10-11 DIAGNOSIS — F410 Panic disorder [episodic paroxysmal anxiety] without agoraphobia: Secondary | ICD-10-CM

## 2023-10-11 DIAGNOSIS — F411 Generalized anxiety disorder: Secondary | ICD-10-CM | POA: Diagnosis not present

## 2023-10-11 DIAGNOSIS — F430 Acute stress reaction: Secondary | ICD-10-CM

## 2023-10-11 MED ORDER — PROPRANOLOL HCL 10 MG PO TABS: 10.0000 mg | ORAL_TABLET | Freq: Three times a day (TID) | ORAL | 0 refills | Status: DC

## 2023-10-11 NOTE — Progress Notes (Signed)
 [facility] Counselor/Therapist Progress Note  Patient ID: Valerie [name], MRN: [id number],    Date: 10/11/2023  Time Spent: 53 minutes   Treatment Type: Individual Therapy  Reported Symptoms: anxiety, some depression, agitation, frustration, irritability, family stressors, looking for more peace and less confrontation in family, gets used very easily by some of family, wanting more peace    Mental Status Exam:  Appearance:   Casual and Neat     Behavior:  Appropriate, Sharing, and Motivated  Motor:  Normal  Speech/Language:   Clear and Coherent  Affect:  Anxiety, some depression  Mood:  anxious and depressed  Thought process:  goal directed  Thought content:    Rumination  Sensory/Perceptual disturbances:    WNL  Orientation:  oriented to person, place, time/date, situation, day of week, month of year, year, and stated date of Aug. 11, 2025  Attention:  Good  Concentration:  Good and Fair  Memory:  WNL  Fund of knowledge:   Good  Insight:    Good and Fair  Judgment:   Good  Impulse Control:  Fair and Poor   Risk Assessment: Danger to Self:  No Self-injurious Behavior: No Danger to Others: No Duty to Warn:no Physical Aggression / Violence:No  Access to Firearms a concern: No  Gang Involvement:No   Subjective:  Patient working further today in session on her boundary setting, not being afraid to set boundaries, looking at cause and effect, and what patient can/cannot do to impact her anxiety, frustration, hurt, anger, and family issues.  Doesn't feel she is respected and feels not valued within the family and including my husband.  Not much emotional support felt from husband or other family members. Knows she needs more self-confidence and has been working on this some. Feels it will be more important in the future as they plan to move soon to be closer to other family in [facility]. Mixed emotions and feelings shared and processed in session today. Feeling pushed and  used in some ways and trying to work through her feelings and begin to set healthier limits and boundaries with others in family. Doesn't feel heard. Frustrated but also difficult to set healthy boundaries. Role-played several examples with patient about her setting boundaries and this seemed helpful to her. More proactive. Working on better boundaries and self-care. Long history of communication issues within family and her being used because it's hard to say No, and patient continues to work on these issues.  Encouraged her to focus more on what she can change rather than what she cannot change well-controlled.  Working to help patient recognize more of when she sets herself up to be manipulated by others and how she can handle situations better and in ways that reflect good insight, good judgment, and strength.  Also working to believe more in herself and her ability to create healthier boundaries and more positive actions in her life.   Interventions: Cognitive Behavioral Therapy, Solution-Oriented/Positive Psychology, and Ego-Supportive Long-term goal: Increase understanding of beliefs and messages that produce the worry and anxiety. Short-term goal: Reduce overall level, frequency, and intensity of the anxiety so that daily functioning is not impaired. Strategies: Patient to learn further about anxiety re: daily skills for managing it, and to use the skills effectively in everyday life.   Diagnosis:   ICD-10-CM   1. Generalized anxiety disorder  F41.1      Plan: Patient today in session continues working on personal and family relationship communication and trying to  have healthier relationships.  Also working on her own self-esteem and being able to stand up for herself while setting limits with people who tend to manipulate her.  Continues working on better management of anxiety and anger, believing in herself more and improving communication skills.  Encouraged patient to be searching  for more strength within herself and not overly focused on what she perceives to be her weaknesses or negatives as this always tends to lead her to self sabotage more frequently, continue working on counteracting her self-doubt, pay attention to how she says what she says, stay in the present focused on what she can change or control, interrupt and decrease her overthinking and over analyzing, refrain from assuming worst-case scenarios, understand that her anxious thoughts are just thoughts and are not necessarily going to play out in reality the way she might fear, use of positive self talk, working more intentionally on having healthier boundaries within the family, be able to walk away from volatile situations, and realize the strength she shows when working with goal-directed behaviors and a more focused way so as to move in a direction that supports her overall improved emotional health and her wellbeing.  [name] continues to show progress and remains committed and working on goal-directed behaviors helping her to move in a more positive and healthier direction into the future.  Goal review and progress/challenges noted with patient.  Next appointment within 2 weeks.   Barnie Bunde, LCSW

## 2023-10-13 ENCOUNTER — Telehealth: Payer: Self-pay | Admitting: Behavioral Health

## 2023-10-13 ENCOUNTER — Other Ambulatory Visit: Payer: Self-pay

## 2023-10-13 DIAGNOSIS — F411 Generalized anxiety disorder: Secondary | ICD-10-CM

## 2023-10-13 MED ORDER — BUSPIRONE HCL 30 MG PO TABS: 30.0000 mg | ORAL_TABLET | Freq: Two times a day (BID) | ORAL | 0 refills | Status: DC

## 2023-10-13 NOTE — Telephone Encounter (Signed)
 At May visit note says to take propranolol  10 mg TID. Patient said she was told to take two BID, which is how she has been taking. She has FU next week, but needs a RF before then. Please advise what she should be taking.

## 2023-10-13 NOTE — Telephone Encounter (Signed)
 Pt does not have an appt with Redell until 8/20.  His last note says to take one 10-mg tablet TID. She said he told her to take two BID. Will check with Redell on dosing.

## 2023-10-13 NOTE — Telephone Encounter (Signed)
 Reviewed directions for how to take.

## 2023-10-13 NOTE — Telephone Encounter (Signed)
 Elyssia saw Redell this morning. She said that directions read  Propanalol  1-3 day and there was another prescription that read two twice a day. She said the directions are different. She has 6 pills left. Could someone please call her at [phone].

## 2023-10-20 ENCOUNTER — Encounter: Payer: Self-pay | Admitting: Behavioral Health

## 2023-10-20 ENCOUNTER — Ambulatory Visit: Admitting: Psychiatry

## 2023-10-20 ENCOUNTER — Ambulatory Visit: Admitting: Behavioral Health

## 2023-10-20 DIAGNOSIS — F430 Acute stress reaction: Secondary | ICD-10-CM | POA: Diagnosis not present

## 2023-10-20 DIAGNOSIS — F331 Major depressive disorder, recurrent, moderate: Secondary | ICD-10-CM

## 2023-10-20 DIAGNOSIS — L570 Actinic keratosis: Secondary | ICD-10-CM | POA: Diagnosis not present

## 2023-10-20 DIAGNOSIS — R208 Other disturbances of skin sensation: Secondary | ICD-10-CM | POA: Diagnosis not present

## 2023-10-20 DIAGNOSIS — D2272 Melanocytic nevi of left lower limb, including hip: Secondary | ICD-10-CM | POA: Diagnosis not present

## 2023-10-20 DIAGNOSIS — F411 Generalized anxiety disorder: Secondary | ICD-10-CM

## 2023-10-20 DIAGNOSIS — F410 Panic disorder [episodic paroxysmal anxiety] without agoraphobia: Secondary | ICD-10-CM

## 2023-10-20 DIAGNOSIS — L538 Other specified erythematous conditions: Secondary | ICD-10-CM | POA: Diagnosis not present

## 2023-10-20 DIAGNOSIS — F99 Mental disorder, not otherwise specified: Secondary | ICD-10-CM

## 2023-10-20 DIAGNOSIS — L820 Inflamed seborrheic keratosis: Secondary | ICD-10-CM | POA: Diagnosis not present

## 2023-10-20 DIAGNOSIS — F5105 Insomnia due to other mental disorder: Secondary | ICD-10-CM | POA: Diagnosis not present

## 2023-10-20 DIAGNOSIS — L821 Other seborrheic keratosis: Secondary | ICD-10-CM | POA: Diagnosis not present

## 2023-10-20 DIAGNOSIS — L814 Other melanin hyperpigmentation: Secondary | ICD-10-CM | POA: Diagnosis not present

## 2023-10-20 DIAGNOSIS — D225 Melanocytic nevi of trunk: Secondary | ICD-10-CM | POA: Diagnosis not present

## 2023-10-20 MED ORDER — PROPRANOLOL HCL 20 MG PO TABS: 20.0000 mg | ORAL_TABLET | Freq: Two times a day (BID) | ORAL | 3 refills | Status: DC

## 2023-10-20 MED ORDER — BUSPIRONE HCL 30 MG PO TABS: 30.0000 mg | ORAL_TABLET | Freq: Two times a day (BID) | ORAL | 3 refills | Status: DC

## 2023-10-20 MED ORDER — FLUOXETINE HCL 20 MG PO CAPS: 80.0000 mg | ORAL_CAPSULE | Freq: Every day | ORAL | 3 refills | Status: DC

## 2023-10-20 MED ORDER — TEMAZEPAM 30 MG PO CAPS: 30.0000 mg | ORAL_CAPSULE | Freq: Every day | ORAL | 1 refills | Status: DC

## 2023-10-20 MED ORDER — QUETIAPINE FUMARATE 100 MG PO TABS: 100.0000 mg | ORAL_TABLET | Freq: Every day | ORAL | 3 refills | Status: DC

## 2023-10-20 NOTE — Progress Notes (Signed)
 [facility] Med Check  Patient ID: Valerie [name],  MRN: [id number]  PCP: Rolinda Millman, MD  Date of Evaluation: 10/20/2023 Time spent:30 minutes  Chief Complaint:  Chief Complaint   Depression; Anxiety; Follow-up; Patient Education; Panic Attack; Medication Refill     HISTORY/CURRENT STATUS: HPI  Valerie [name], 63 year old female presents to this office for follow up and medication management. Discussed her report of significant improvement. She is very happy with the effect of propranolol . Says that it has curtailed her panic attacks and that she has minimized her xanax  use.   She continues to see Marval Bunde for therapy.  She says that her anxiety today is 2 of 10, and her depression is 1of 10.  She is sleeping 7-8 hours every night with the aid of Restoril . She denies any history of mania, no psychosis, no history of auditory or visual hallucinations.  She denies SI or HI.   Prior psychiatric medication trial: Prozac  Xanax  Temazepam  Ambien Mirtazapine  Trazodone Seroquel         Individual Medical History/ Review of Systems: Changes? :No   Allergies: Oxycodone, Oxycodone-acetaminophen , Penicillins, and Potassium  Current Medications:  Current Outpatient Medications:    propranolol  (INDERAL ) 20 MG tablet, Take 1 tablet (20 mg total) by mouth 2 (two) times daily., Disp: 60 tablet, Rfl: 3   ALPRAZolam  (XANAX ) 0.5 MG tablet, Take 1 tablet (0.5 mg total) by mouth 2 (two) times daily as needed., Disp: 180 tablet, Rfl: 1   busPIRone  (BUSPAR ) 30 MG tablet, Take 1 tablet (30 mg total) by mouth 2 (two) times daily., Disp: 60 tablet, Rfl: 3   docusate sodium  (COLACE) 100 MG capsule, Take 100 mg by mouth daily., Disp: , Rfl:    FLUoxetine  (PROZAC ) 20 MG capsule, Take 4 capsules (80 mg total) by mouth daily., Disp: 120 capsule, Rfl: 3   HYDROcodone -acetaminophen  (NORCO/VICODIN) 5-325 MG tablet, Take 1 tablet by mouth every 4 (four) hours as needed for up to 18 doses for moderate  pain (pain score 4-6)., Disp: 18 tablet, Rfl: 0   ibuprofen (ADVIL) 400 MG tablet, Take 400 mg by mouth every 6 (six) hours as needed., Disp: , Rfl:    levothyroxine  (SYNTHROID ) 75 MCG tablet, Take 37.5-75 mcg by mouth See admin instructions. Take 37.5 mcg by mouth in the morning before breakfast on Sunday and 75 mcg on Mon/Tues/Wed/Thurs/Fri/Sat, Disp: , Rfl:    LINZESS  290 MCG CAPS capsule, Take 290 mcg by mouth every morning., Disp: , Rfl:    omeprazole (PRILOSEC) 40 MG capsule, Take 40 mg by mouth daily as needed (heartburn)., Disp: , Rfl:    oxybutynin  (DITROPAN  XL) 10 MG 24 hr tablet, Take 1 tablet (10 mg total) by mouth at bedtime., Disp: 30 tablet, Rfl: 0   QUEtiapine  (SEROQUEL ) 100 MG tablet, Take 1 tablet (100 mg total) by mouth at bedtime., Disp: 30 tablet, Rfl: 3   senna (SENOKOT) 8.6 MG TABS tablet, Take 2 tablets by mouth in the morning., Disp: , Rfl:    tamsulosin  (FLOMAX ) 0.4 MG CAPS capsule, Take 1 capsule (0.4 mg total) by mouth daily., Disp: 30 capsule, Rfl: 1   temazepam  (RESTORIL ) 30 MG capsule, Take 1 capsule (30 mg total) by mouth at bedtime., Disp: 90 capsule, Rfl: 1 Medication Side Effects: none  Family Medical/ Social History: Changes? No  MENTAL HEALTH EXAM:  There were no vitals taken for this visit.There is no height or weight on file to calculate BMI.  General Appearance: Casual and Well Groomed  Eye Contact:  Good  Speech:  Clear and Coherent  Volume:  Normal  Mood:  NA  Affect:  Appropriate  Thought Process:  Coherent  Orientation:  Full (Time, Place, and Person)  Thought Content: Logical   Suicidal Thoughts:  No  Homicidal Thoughts:  No  Memory:  WNL  Judgement:  Good  Insight:  Good  Psychomotor Activity:  Normal  Concentration:  Concentration: Good  Recall:  Good  Fund of Knowledge: Good  Language: Good  Assets:  Desire for Improvement  ADL's:  Intact  Cognition: WNL  Prognosis:  Good    DIAGNOSES:    ICD-10-CM   1. Panic attack as  reaction to stress  F41.0 propranolol  (INDERAL ) 20 MG tablet   F43.0     2. Generalized anxiety disorder  F41.1 propranolol  (INDERAL ) 20 MG tablet    busPIRone  (BUSPAR ) 30 MG tablet    FLUoxetine  (PROZAC ) 20 MG capsule    QUEtiapine  (SEROQUEL ) 100 MG tablet    3. Major depressive disorder, recurrent episode, moderate (HCC)  F33.1 FLUoxetine  (PROZAC ) 20 MG capsule    4. Insomnia due to other mental disorder  F51.05 QUEtiapine  (SEROQUEL ) 100 MG tablet   F99 temazepam  (RESTORIL ) 30 MG capsule      Receiving Psychotherapy: No    RECOMMENDATIONS:   Greater than 50% of 30 min face to face time with patient was spent on counseling and coordination of care. We talked about her current stability. Discussed her great improvement with Inderal . She is very happy with the medication. Says that is has prevented panic attacks and reduced her overall anxiety.  We reviewed her medications  and no medication changes indicated today.  Still participating in psychotherapy. Still dealing with family stressors. Pt will be moving to [facility] in October and will need bridge on medications.   We agreed to; To continue Propranolol  20 mg twice daily as needed for anxiety or panic  To continue Buspar  to 30 mg twice daily, 8 hours between dosing.  Will continue Xanax  0.5 mg twice daily as needed for severe anxiety To continue Prozac  80 mg daily.  Patient is taking four 20 mg tablets To continue Seroquel  100  mg at bedtime.  To continue temazepam  30 mg capsule at bedtime for sleep.  Pt denies dx of prolonged QT interval. I discussed with her and she is not aware. To discuss with PCP next visit. We will report side effects or worsening symptoms promptly Provided emergency contact information We will follow-up in 3 months as required to continue medications. Discussed potential benefits, risk, and side effects of benzodiazepines to include potential risk of tolerance and dependence, as well as possible drowsiness.   Advised patient not to drive if experiencing drowsiness and to take lowest possible effective dose to minimize risk of dependence and tolerance.  Discussed potential metabolic side effects associated with atypical antipsychotics, as well as potential risk for movement side effects. Advised pt to contact office if movement side effects occur.   Reviewed PDMP   Redell DELENA Pizza, NP

## 2023-10-20 NOTE — Progress Notes (Signed)
 [facility] Counselor/Therapist Progress Note  Patient ID: Valerie [name], MRN: [id number],    Date: 10/20/2023  Time Spent: 55 minutes   Treatment Type: Individual Therapy  Reported Symptoms:  anxiety, some depression but better, frustration, some agitation, family stressors, still looking for more peace within her family and less confrontation, trying to decrease her being used by others within family, lots of difficulty setting limits   Mental Status Exam:  Appearance:   Casual     Behavior:  Appropriate, Sharing, and Motivated  Motor:  Normal  Speech/Language:   Clear and Coherent  Affect:  anxious  Mood:  anxious and some frustration and depression  Thought process:  goal directed  Thought content:    Rumination  Sensory/Perceptual disturbances:    WNL  Orientation:  oriented to person, place, time/date, situation, day of week, month of year, year, and stated date of Aug. 20, 2025  Attention:  Good  Concentration:  Good  Memory:  WNL  Fund of knowledge:   Good  Insight:    Good and Fair  Judgment:   Good and Fair  Impulse Control:  Good and Fair   Risk Assessment: Danger to Self:  No Self-injurious Behavior: No Danger to Others: No Duty to Warn:no Physical Aggression / Violence:No  Access to Firearms a concern: No  Gang Involvement:No   Subjective:    Patient in session today continuing to work more on her anxiety, frustration, hurt, anger, and particularly her setting of boundaries and feeling a little more empowered and setting boundaries per some of the work she has done in previous sessions, however it remains difficult due to her past as I've never had boundaries in the past because I didn't feel valid nor wanted too much, so being a people pleaser did help me feel more wanted and valid. Processed this more in session today and how she can make some changes that would be positive for her. Working on better communication, setting boundaries more and more  especially within family, being able to let go and wanting to move forward in direction that is healthier for her. Trying to be ore proactive, boundary-conscious, and not be discouraged when she has a more challenging day at times.   Interventions: Cognitive Behavioral Therapy, Solution-Oriented/Positive Psychology, and Ego-Supportive Long-term goal: Increase understanding of beliefs and messages that produce the worry and anxiety. Short-term goal: Reduce overall level, frequency, and intensity of the anxiety so that daily functioning is not impaired. Strategies: Patient to learn further about anxiety re: daily skills for managing it, and to use the skills effectively in everyday life.   Diagnosis:   ICD-10-CM   1. Generalized anxiety disorder  F41.1      Plan:     Patient in session today showing good motivation and participation as she continues trying to make healthier changes for herself within her personal and family relationships including communication, boundaries, self-esteem, setting healthier limits, and being able to say no when needed.  Feels she is making a little bit of progress and not allowing others to manipulate her as much, particularly within the family.  Reports that she is continuing to work on better management of her anger and anxiety particularly, while trying to continue believing in herself more and emphasizing improved communication skills within the family and beyond. Reminded and encouraged patient to keep looking for more strength within herself and not overly focused on what she perceives to be her weaknesses or negatives as this tends to  feed her self sabotage more frequently, continue working on counteracting her self-doubt, pay attention to how she says what she says, stay in the present focused on what she can control or change, interrupt and decrease her overthinking and over analyzing, refrain from assuming worst-case scenarios, understand that her anxious  thoughts are just thoughts and are not necessarily going to play out in reality the way she might assume, use of positive self talk, working more intentionally on having healthier boundaries within the family, be able to walk away from volatile situations, and recognize the strengths she shows when working with goal-directed behaviors and a more focused way so as to move in a direction that supports her overall improved emotional health and outlook into the future.  Dianalynn Dix does continue to show progress and remains committed to working with her goal-directed behaviors that are helping her to move in a more positive and healthier direction going forward.  Goal review and progress/challenges noted with patient.  Next appointment within 2 weeks.   Barnie Bunde, LCSW

## 2023-10-26 DIAGNOSIS — D125 Benign neoplasm of sigmoid colon: Secondary | ICD-10-CM | POA: Diagnosis not present

## 2023-10-26 DIAGNOSIS — D123 Benign neoplasm of transverse colon: Secondary | ICD-10-CM | POA: Diagnosis not present

## 2023-10-26 DIAGNOSIS — K644 Residual hemorrhoidal skin tags: Secondary | ICD-10-CM | POA: Diagnosis not present

## 2023-10-26 DIAGNOSIS — K648 Other hemorrhoids: Secondary | ICD-10-CM | POA: Diagnosis not present

## 2023-10-26 DIAGNOSIS — K5730 Diverticulosis of large intestine without perforation or abscess without bleeding: Secondary | ICD-10-CM | POA: Diagnosis not present

## 2023-10-26 DIAGNOSIS — Z860101 Personal history of adenomatous and serrated colon polyps: Secondary | ICD-10-CM | POA: Diagnosis not present

## 2023-10-26 DIAGNOSIS — Z09 Encounter for follow-up examination after completed treatment for conditions other than malignant neoplasm: Secondary | ICD-10-CM | POA: Diagnosis not present

## 2023-10-28 DIAGNOSIS — D125 Benign neoplasm of sigmoid colon: Secondary | ICD-10-CM | POA: Diagnosis not present

## 2023-10-28 DIAGNOSIS — D123 Benign neoplasm of transverse colon: Secondary | ICD-10-CM | POA: Diagnosis not present

## 2023-11-03 ENCOUNTER — Ambulatory Visit: Admitting: Psychiatry

## 2023-11-03 DIAGNOSIS — F411 Generalized anxiety disorder: Secondary | ICD-10-CM | POA: Diagnosis not present

## 2023-11-03 NOTE — Progress Notes (Signed)
 [facility] Counselor/Therapist Progress Note  Patient ID: Valerie [name], MRN: [id number],    Date: 11/03/2023  Time Spent: 55 minutes   Treatment Type: Individual Therapy  Reported Symptoms: anxiety, some depression, frustration, some agitation, family stressors, still looking for more peace within my family and less confrontation, trying to decrease being used by others within family, significant difficulty setting limits   Mental Status Exam:  Appearance:   Casual and Neat     Behavior:  Appropriate, Sharing, and Motivated  Motor:  Normal  Speech/Language:   Clear and Coherent  Affect:  Depressed and anxious  Mood:  anxious and depressed  Thought process:  goal directed  Thought content:    Rumination  Sensory/Perceptual disturbances:    WNL  Orientation:  oriented to person, place, time/date, situation, day of week, month of year, year, and stated date of Sept. 3, 2025  Attention:  Good  Concentration:  Good and Fair  Memory:  WNL  Fund of knowledge:   Good  Insight:    Good and Fair  Judgment:   Good and Fair  Impulse Control:  Good and Fair   Risk Assessment: Danger to Self:  No Self-injurious Behavior: No Danger to Others: No Duty to Warn:no Physical Aggression / Violence:No  Access to Firearms a concern: No  Gang Involvement:No   Subjective:  Patient today working further on her anger, anxiety, frustrations, hurt and the need to set and keep better boundaries which is helping her feel more empowered, continues to say I've never had boundaries due to not feeling wanted nor valid and when I'm being a people pleaser I feel more wanted.  Processed this more with patient and she is working to let go of this and rather it festers. Also working today on her communication and relationship issues she is trying to confront and work on. Concentrating more on issues of hurt and anger and her difficulty setting healthy boundaries with she worked on further today using  specific examples that occurred recently in the family.  Good work today on her trying to not allow people to so easily manipulate her and being able to say no without guilt.  This is especially difficult for patient as she has allowed people to manipulate her most of her life.  Has not had good boundaries in place.  Longtime people pleaser.  Was less in denial in session today which was good.  Working to let go of certain feelings from the past so it does not block her from moving forward now.  Is also continuing to work on being boundary-conscious, not feeling like she always has to be a yes person, being more proactive, and taking 1 day at the time which she has said helps her.   Interventions: Cognitive Behavioral Therapy, Solution-Oriented/Positive Psychology, and Ego-Supportive Long-term goal: Increase understanding of beliefs and messages that produce the worry and anxiety. Short-term goal: Reduce overall level, frequency, and intensity of the anxiety so that daily functioning is not impaired. Strategies: Patient to learn further about anxiety re: daily skills for managing it, and to use the skills effectively in everyday life.   Diagnosis:   ICD-10-CM   1. Generalized anxiety disorder  F41.1      Plan:    Patient today participating well and motivated in her work to change some long-term negative behaviors to be healthier for herself and also to be able to have healthier family relationships based on improved communication, self-esteem, setting healthier limits, clear  boundaries, and her being able to say no as needed.  Patient does feel that she has made some progress especially and setting better limits with other people and not letting them manipulate her as often, and this is especially true within the family.  She has continued to work on her anger management and her anxiety, along with trying to believe more in herself.  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  Encouraged patient for her to keep looking for more strength within herself and not overly focused on what she feels are her weaknesses or negatives so as to not feed her own self sabotage, continue working on counteracting self-doubt, pay attention to how she says what she says, stay in the present focused on what she can control or change, interrupt and decrease her overthinking and over analyzing, refrain from assuming worst-case scenarios, understand that her anxious thoughts are just thoughts and are not necessarily going to play out in reality the way she might assume, use a positive self-talk, work more intentionally on having healthier boundaries within the family, be able to walk away from volatile situations, and realize the strength she shows when working with goal-directed behaviors and a more intentionally focused way so as to move in a direction that supports her overall improved emotional health and her outlook going forward.  Valerie [name] continues to show good effort and working on her goals and is showing some progress while remaining committed to working with goal-directed behaviors that are helping her move in a healthier and more positive direction into the future.  Goal review and progress/challenges noted with patient.  Next appointment within 2 weeks.   Barnie Bunde, LCSW

## 2023-11-17 ENCOUNTER — Ambulatory Visit: Admitting: Psychiatry

## 2023-12-01 ENCOUNTER — Ambulatory Visit: Admitting: Psychiatry

## 2023-12-01 DIAGNOSIS — F411 Generalized anxiety disorder: Secondary | ICD-10-CM | POA: Diagnosis not present

## 2023-12-01 NOTE — Progress Notes (Signed)
 [facility] Counselor/Therapist Progress Note  Patient ID: Valerie [name], MRN: [id number],    Date: 12/01/2023  Time Spent: 55 minutes   Treatment Type: Individual Therapy  Reported Symptoms: anxiety, agitation, depression, frustration, family stressors, looking for more peace within family and less confrontation, trying to decrease being used by others in family, work on setting healthier limits   Mental Status Exam:  Appearance:   Casual     Behavior:  Appropriate, Sharing, and Motivated  Motor:  Normal  Speech/Language:   Clear and Coherent  Affect:  Depressed and anxious  Mood:  anxious and depressed  Thought process:  goal directed  Thought content:    Rumination  Sensory/Perceptual disturbances:    WNL  Orientation:  oriented to person, place, time/date, situation, day of week, month of year, year, and stated date of Oct. 1, 2025  Attention:  Good  Concentration:  Fair  Memory:  WNL  Fund of knowledge:   Good  Insight:    Good and Fair  Judgment:   Good and Fair  Impulse Control:  Fair   Risk Assessment: Danger to Self:  No Self-injurious Behavior: No Danger to Others: No Duty to Warn:no Physical Aggression / Violence:No  Access to Firearms a concern: No  Gang Involvement:No   Subjective:  Patient in session today focusing more on her anxiety, anger, frustrations, hurt and states she knows she needs better boundaries but it making more efforts to set them. I've never set boundaries so it's harder, plus part of it is my need to be needed, and explored this more in session today. Equates being needed and loved with always being open to doing anything and everything. Worked more on this today in session as she has difficulty with healthy communication and limit setting, especially within her marriage and family. Talked further today about their need for marital counseling. (Not all details included in this note due to patient privacy needs.) Patient  continues her work on issues relating to hurt and anger within the marriage.  Looking at what she can change and what she cannot change. Working to set healthier boundaries without guilt, and be able to let go of certain feelings that are from  the past and keeping patient from moving forward. People pleasing continues. Less denial. Feels she has to always say yes .     Interventions: Cognitive Behavioral Therapy, Solution-Oriented/Positive Psychology, and Ego-Supportive Long-term goal: Increase understanding of beliefs and messages that produce the worry and anxiety. Short-term goal: Reduce overall level, frequency, and intensity of the anxiety so that daily functioning is not impaired. Strategies: Patient to learn further about anxiety re: daily skills for managing it, and to use the skills effectively in everyday life.   Diagnosis:   ICD-10-CM   1. Generalized anxiety disorder  F41.1      Plan: Patient today working further on behavioral changes and changes to her mindset regarding her marriage, her personal life, and relationships within the family particularly around communication, self-esteem, clear boundaries, healthier limits, and her being able to say no when needed.  Patient has made some progress and this is difficult work for her.  Doing better with the setting limits on some occasions.  Hard to refrain from speaking when others anger her.  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  Continue to encourage patient to focus more on the strength within herself versus  what she feels are weaknesses or negatives as that tends to lead her to self sabotage and self-doubt.  Pay more attention to how she says what she says, staying more focused in the present on what she can control or change, interrupt and decrease her overthinking and overanalyzing, refrain from assuming worst-case scenarios,  understanding that her anxious thoughts are just thoughts and are not necessarily going to play out in reality the way she might think, encouraging her to use more positive self-talk, working further on intentionally having clear boundaries within the family and her marriage, choosing to walk away from volatile situations, and recognizing the strengths she shows as she continues her work with goal-directed behaviors to more intentionally focus and ways that support her overall improved emotional health and goal-directed behaviors.  Valerie [name] does show some good effort and work on her goals as she makes progress and remains committed to working with goal-directed behaviors helping her move and a more positive a healthier direction into her future.   Goal review and progress/challenges noted with patient.  Next appointment within 2 weeks.   Barnie Bunde, LCSW

## 2023-12-15 ENCOUNTER — Ambulatory Visit: Admitting: Psychiatry

## 2023-12-15 DIAGNOSIS — F411 Generalized anxiety disorder: Secondary | ICD-10-CM | POA: Diagnosis not present

## 2023-12-15 NOTE — Progress Notes (Signed)
 [facility] Counselor/Therapist Progress Note  Patient ID: Valerie [name], MRN: [id number],    Date: 12/15/2023  Time Spent: 55 minutes   Treatment Type: Individual Therapy  Reported Symptoms: anxiety,agitation decreased, some depression, family stressors, looking for more peace within herself and within family, some grief related issues, communication issues (improving some), setting healthier limits and boundaries, feels husband is also trying to work on things in their relationship   Mental Status Exam:  Appearance:   Casual and Neat     Behavior:  Appropriate, Sharing, and Motivated  Motor:  Normal  Speech/Language:   Clear and Coherent  Affect:  Depressed and anxious  Mood:  anxious, depressed, and improving  Thought process:  goal directed  Thought content:    Rumination  Sensory/Perceptual disturbances:    WNL  Orientation:  oriented to person, place, time/date, situation, day of week, month of year, year, and stated date of Oct. 15, 2025  Attention:  Good  Concentration:  Good  Memory:  WNL  Fund of knowledge:   Good  Insight:    Good and Fair  Judgment:   Good  Impulse Control:  Good   Risk Assessment: Danger to Self:  No Self-injurious Behavior: No Danger to Others: No Duty to Warn:no Physical Aggression / Violence:No  Access to Firearms a concern: No  Gang Involvement:No   Subjective:   Patient today in session working further on her anxiety, anger, family stressors, frustrations, hurt, and adds that she knows she needs better boundaries but this is a difficult task for her although is making efforts more intentionally.  She acknowledges again in this session that she has never really set clear boundaries and she sees this as being related to her need to be needed. Noting and sharing some progress made especially in her communication with husband, as she works to remain calmer and speak in ways that support healthier communication with less sharp  comments, and is a work in progress. Feels she is more understanding in relationship with husband. Working on her longtime feelings of needing to be loved and needed in healthier ways and reports she is working further on this now in more natural ways. Difficulty managing the demands of adult son and worked on this further today. Reports working further on not always having to say Yes to others, including family. Limit setting difficult. More awareness of what she can change versus cannot change. Really paying more attention to her communication patterns and how to change them some that can help  her relationships. Less denial overall. Working on improved boundaries and communication. People pleasing....a habit I do need to work on and is currently on of the things I'm working on decreasing.    Interventions: Cognitive Behavioral Therapy, Solution-Oriented/Positive Psychology, and Ego-Supportive Long-term goal: Increase understanding of beliefs and messages that produce the worry and anxiety. Short-term goal: Reduce overall level, frequency, and intensity of the anxiety so that daily functioning is not impaired. Strategies: Patient to learn further about anxiety re: daily skills for managing it, and to use the skills effectively in everyday life.    Diagnosis:   ICD-10-CM   1. Generalized anxiety disorder  F41.1      Plan:  Patient today in session concentrating further on her behavioral changes related to her treatment goals, changes to her mindset regarding her marriage, her personal life issues, and relationships within the family particularly regarding communication, self-esteem, clear boundaries, healthier limits, and for patient to be able to say  no as needed. Does seem to be noticing some of her progress and she reports noticing more positives while letting go of negatives.  Some issues specifically related to mother where there has been long-term ill feelings. Patient showing good  follow through on homework between sessions. Working further on calming herself as needed and realizing how she focuses better when calm.  Encouraged patient to focus more on the strength within herself versus what she feels are weaknesses or negatives that tend to lead her to self sabotage and self-doubt, pay more attention to how she says what she says, stay more focused in the present on what she can control or change, interrupt and decrease her overthinking and overanalyzing, refrain from assuming worst-case scenarios, understand that her anxious thoughts are just thoughts and are not necessarily going to play out in reality the way she might think, encouraged her to use more positive self-talk, working further on intentionally having clear boundaries within the family and her marriage, choosing to walk away from volatile situations, and recognize the strength she shows as she continues her work with goal-directed behaviors to more intentionally focus on ways that support her overall improved emotional health.  Valerie [name] does show some good effort and work on her goals, continuing to focus on maintaining some of her progress as she remains committed to working with goal-directed behaviors helping her to move in a more healthy and positive direction into the future.  Goal review and progress/challenges noted with patient.  Next appointment within 2 to 3 weeks.   Barnie Bunde, LCSW

## 2023-12-29 ENCOUNTER — Ambulatory Visit: Admitting: Psychiatry

## 2023-12-29 DIAGNOSIS — F411 Generalized anxiety disorder: Secondary | ICD-10-CM | POA: Diagnosis not present

## 2023-12-29 NOTE — Progress Notes (Signed)
 [facility] Counselor/Therapist Progress Note  Patient ID: Valerie [name], MRN: [id number],    Date: 12/29/2023  Time Spent: 55 minutes   Treatment Type: Individual Therapy  Reported Symptoms: anxiety, family stressors, agitated and aggravated re: son, self-doubt, decreased depression, some grief re: past family deaths and anniversary dates, setting healthier boundaries, most recent conflict with adult son age 65.    Mental Status Exam:  Appearance:   Casual and Neat     Behavior:  Appropriate, Sharing, and Motivated  Motor:  Normal  Speech/Language:   Clear and Coherent  Affect:  Depressed and anxiety  Mood:  anxious, depressed, and irritable  Thought process:  goal directed  Thought content:    Rumination  Sensory/Perceptual disturbances:    WNL  Orientation:  oriented to person, place, time/date, situation, day of week, month of year, year, and stated date of Oct. 29, 2025  Attention:  Good  Concentration:  Good and Fair  Memory:  WNL  Fund of knowledge:   Good  Insight:    Good and Fair  Judgment:   Good and Fair  Impulse Control:  Good and Fair   Risk Assessment: Danger to Self:  No Self-injurious Behavior: No Danger to Others: No Duty to Warn:no Physical Aggression / Violence:No  Access to Firearms a concern: No  Gang Involvement:No   Subjective: Patient today anxious and some depression and irritability as she works in session o some increased family and relationship turmoil and issues. Personal and communication issues within the family has been more of an issue recently. Difficult, angry, hostile communication between patient & husband with their 36 yr old adult son Curtistine. Patient angered and hurt by this as adult son was very critical and making hurtful observations that. Angry and hurt today and processing lots of that hurt along with anger and frustration. Processing well today and some increased insight as to how relationship communication can be  healthier than it is right now with adult son. Focused more on need to be needed and how important that is for her and healthier ways to look at that  versus letting herself be used and then end up angry or hurt.  More defensive today but good that she is venting this and able to work on it more in session.  Some progress also noted with patient.  Has been more recently understanding and her relationship with husband as they both work on their feelings of needing to be loved and to love and healthier ways.  Continued issues with difficult demands of adult son.  Realizing she does not always have to say yes to others including family.  Limit-setting she is finding to be difficult however recently as noted above spoke about more directly, but may work on some better ways of communicating when she is angry and also being willing to take a break and then speak when she is not feeling the anger quite so strongly and may be heard better by others.  Boundaries and communication continues to be an issue that she is working on along with people pleasing.    Interventions: Cognitive Behavioral Therapy, Solution-Oriented/Positive Psychology, and Insight-Oriented Long-term goal: Increase understanding of beliefs and messages that produce the worry and anxiety. Short-term goal: Reduce overall level, frequency, and intensity of the anxiety so that daily functioning is not impaired. Strategies: Patient to learn further about anxiety re: daily skills for managing it, and to use the skills effectively in everyday life.   Diagnosis:  ICD-10-CM   1. Generalized anxiety disorder  F41.1      Plan: Patient today venting frustration, anger, distrust, depression.  Expressing herself verbally a little more than usual with adult son who she reports tends more hurtful and controlling in their communication.  Continues to work on her own behavioral changes, communication challenges, her mindset regarding her marriage  and personal life issues, other relationships within the family, having clear boundaries, self-esteem, healthier limits, and being able to say no more often as needed.  Continues to notice some of her progress and agrees to work further on calming herself more and make the connection of how she does communicate better when she is more calm and when she is more calm she is likely to be heard better by others.  Encouraged patient to be focusing more on the strength within herself versus what she feels are her weaknesses or negatives that tend to lead her to self sabotage and self-doubt, pay more attention to how she says what she says, stay more focused in the present on what she can control or change, interrupt and decrease her overthinking and overanalyzing, refrain from assuming worst-case scenarios, understand that her anxious thoughts are just thoughts and are not necessarily going to play out in reality the way she might think, encouraged her to use more positive self-talk, working further on intentionally having clear boundaries within the family, choosing to walk away from volatile situations, and realize the strength she shows as she continues her work with goal-directed behaviors to more intentionally focus on the ways that support her overall improved emotional health.  Dianalynn Prickett does show good effort and work on her goals, continuing to focus on maintaining her progress as she remains committed to working with goal-directed behaviors that are helping her move in a healthier and more positive direction into her future.  Goal review and progress/challenges noted with patient.  Next appointment within 2 to 3 weeks.   Barnie Bunde, LCSW

## 2024-01-13 ENCOUNTER — Other Ambulatory Visit: Payer: Self-pay | Admitting: Family Medicine

## 2024-01-13 DIAGNOSIS — Z1231 Encounter for screening mammogram for malignant neoplasm of breast: Secondary | ICD-10-CM

## 2024-01-14 ENCOUNTER — Ambulatory Visit: Admitting: Psychiatry

## 2024-01-14 DIAGNOSIS — F411 Generalized anxiety disorder: Secondary | ICD-10-CM

## 2024-01-14 NOTE — Progress Notes (Signed)
 [facility] Counselor/Therapist Progress Note  Patient ID: Valerie [name], MRN: [id number],    Date: 01/14/2024  Time Spent: 58 minutes   Treatment Type: Individual Therapy  Reported Symptoms:  anxiety, family stressors, aggravated and agitated re: son, depression not quite as bad and I tend to hang onto negatives, self-doubt decreasing some, setting healthier boundaries, family deaths in past, conflict with adult son age 65   Mental Status Exam:  Appearance:   Casual     Behavior:  Appropriate, Sharing, and Motivated  Motor:  Normal  Speech/Language:   Clear and Coherent  Affect:  Depressed and anxious  Mood:  anxious, depressed, and looking for worst case scenarios  Thought process:  goal directed  Thought content:    Rumination  Sensory/Perceptual disturbances:    WNL  Orientation:  oriented to person, place, time/date, situation, day of week, month of year, year, and stated date of Nov. 14, 2025  Attention:  Good  Concentration:  Good  Memory:  WNL  Fund of knowledge:   Good  Insight:    Good and Fair  Judgment:   Good  Impulse Control:  Good and Fair   Risk Assessment: Danger to Self:  No Self-injurious Behavior: No Danger to Others: No Duty to Warn:no Physical Aggression / Violence:No  Access to Firearms a concern: No  Gang Involvement:No   Subjective:  Patient today reporting symptoms of anxiety, irritability, depression although not as strong, as she works further on some increased family and relationship difficulties.  She has been working more intentionally on her personal and communication issues within the family that had escalated more recently prior to last session. Very difficult communication with son and wife and hard to not respond in ways that would likely escalate the situation and not help the relationship.  Working further on her own hurt, anger, and frustration in the midst of increased family issues. No physical violence but verbal and  emotionally hurtful situation with adult son and wife. Continues to feel hurt and some anger at the way adult son is choosing to not engage with patient and husband. Looking at ways today that she and husband can maintain appropriate boundaries with others within the family, continue working on their own marital relationship, and in between sessions practice more of the strategies we are discussing in sessions.  Continue to emphasize boundaries and communication as discussed in session today.    Interventions: Cognitive Behavioral Therapy, Solution-Oriented/Positive Psychology, and Ego-Supportive Long-term goal: Increase understanding of beliefs and messages that produce the worry and anxiety. Short-term goal: Reduce overall level, frequency, and intensity of the anxiety so that daily functioning is not impaired. Strategies: Patient to learn further about anxiety re: daily skills for managing it, and to use the skills effectively in everyday life.   Diagnosis:   ICD-10-CM   1. Generalized anxiety disorder  F41.1      Plan:  Patient today in session working further on her anxiety, anger, frustration, distress, and depression.  Continues her work on some difficult family situations involving son and his family primarily and she and her husband.  Very difficult issues for patient but she is showing some real strength in opening up about the problems involved within the family, particularly with she and her husband and their adult son and his family.  Homework assignment given and patient showing good motivation although distressed.  No thoughts at all of any self-harm or harm to others.  Just very distressed and hard for  patient not to caving in and return to letting adult son be very manipulative of her and husband and ways that are destructive.  Patient also working on some of her own behavioral changes that are needed including her mindset regarding certain family issues, and communication  challenges particularly in setting any type of boundaries.  Working further on setting healthier limits, her own self-esteem and her need to save note at times and stick with it.  Worked on certain communication strategies that are definitely playing a role in the family stress right now. Therapist continues to encourage patient to be focusing more on her strengths versus what she feels are her weaknesses or negatives that tend to lead her to self sabotage and self-doubt, pay more attention to how she says what she says, stay more focused in the present on what she can control or change, interrupt and decrease her overthinking and overanalyzing, refrain from assuming worst-case scenarios, understand that her anxious thoughts are just thoughts and are not necessarily going to play out in reality the way she might think, encouraged her to use more positive self-talk, work further on intentionally having clear boundaries within the family, choose to walk away from volatile situations, and recognize the strength she can show as she continues her work with goal-directed behaviors to more intentionally focus on the ways that support her overall improved emotional health.  [name] has made progress and continues to show improved effort and work on her goals, working to maintain her progress and build up on it, remaining committed to working with goal-directed behaviors that are helping her move in a healthier and more positive direction into the future.  Goal review and progress/challenges noted with patient.  Next appointment within 2 weeks.   Barnie Bunde, LCSW

## 2024-01-18 ENCOUNTER — Ambulatory Visit: Admitting: Behavioral Health

## 2024-01-18 ENCOUNTER — Encounter: Payer: Self-pay | Admitting: Behavioral Health

## 2024-01-18 DIAGNOSIS — F331 Major depressive disorder, recurrent, moderate: Secondary | ICD-10-CM | POA: Diagnosis not present

## 2024-01-18 DIAGNOSIS — F5105 Insomnia due to other mental disorder: Secondary | ICD-10-CM | POA: Diagnosis not present

## 2024-01-18 DIAGNOSIS — F99 Mental disorder, not otherwise specified: Secondary | ICD-10-CM

## 2024-01-18 DIAGNOSIS — F410 Panic disorder [episodic paroxysmal anxiety] without agoraphobia: Secondary | ICD-10-CM | POA: Diagnosis not present

## 2024-01-18 DIAGNOSIS — F411 Generalized anxiety disorder: Secondary | ICD-10-CM

## 2024-01-18 DIAGNOSIS — F430 Acute stress reaction: Secondary | ICD-10-CM

## 2024-01-18 MED ORDER — TEMAZEPAM 30 MG PO CAPS: 30.0000 mg | ORAL_CAPSULE | Freq: Every day | ORAL | 1 refills | Status: AC

## 2024-01-18 MED ORDER — PROPRANOLOL HCL 20 MG PO TABS: 20.0000 mg | ORAL_TABLET | Freq: Three times a day (TID) | ORAL | 1 refills | Status: AC

## 2024-01-18 MED ORDER — BUSPIRONE HCL 30 MG PO TABS: 30.0000 mg | ORAL_TABLET | Freq: Two times a day (BID) | ORAL | 3 refills | Status: AC

## 2024-01-18 MED ORDER — FLUOXETINE HCL 20 MG PO CAPS: 80.0000 mg | ORAL_CAPSULE | Freq: Every day | ORAL | 3 refills | Status: AC

## 2024-01-18 MED ORDER — QUETIAPINE FUMARATE 100 MG PO TABS: 100.0000 mg | ORAL_TABLET | Freq: Every day | ORAL | 3 refills | Status: AC

## 2024-01-18 NOTE — Progress Notes (Signed)
 [facility] Med Check  Patient ID: Valerie [name],  MRN: [id number]  PCP: Rolinda Millman, MD  Date of Evaluation: 01/18/2024 Time spent:30 minutes  Chief Complaint:   HISTORY/CURRENT STATUS: HPI Valerie [name], 63 year old female presents to this office for follow up and medication management. Feels like propranolol   is not controlling panic as well as before. Questioning whether adjustment is indicated this visit.  She continues to see Marval Bunde for therapy.  She says that her anxiety today is 2 of 10, and her depression is 1of 10.  She is sleeping 7-8 hours every night with the aid of Restoril . She denies any history of mania, no psychosis, no history of auditory or visual hallucinations.  She denies SI or HI.   Prior psychiatric medication trial: Prozac  Xanax  Temazepam  Ambien Mirtazapine  Trazodone Seroquel  Individual Medical History/ Review of Systems: Changes? :No   Allergies: Oxycodone, Oxycodone-acetaminophen , Penicillins, and Potassium  Current Medications:  Current Outpatient Medications:    propranolol  (INDERAL ) 20 MG tablet, Take 1 tablet (20 mg total) by mouth 3 (three) times daily., Disp: 90 tablet, Rfl: 1   ALPRAZolam  (XANAX ) 0.5 MG tablet, Take 1 tablet (0.5 mg total) by mouth 2 (two) times daily as needed., Disp: 180 tablet, Rfl: 1   busPIRone  (BUSPAR ) 30 MG tablet, Take 1 tablet (30 mg total) by mouth 2 (two) times daily., Disp: 60 tablet, Rfl: 3   docusate sodium  (COLACE) 100 MG capsule, Take 100 mg by mouth daily., Disp: , Rfl:    FLUoxetine  (PROZAC ) 20 MG capsule, Take 4 capsules (80 mg total) by mouth daily., Disp: 120 capsule, Rfl: 3   HYDROcodone -acetaminophen  (NORCO/VICODIN) 5-325 MG tablet, Take 1 tablet by mouth every 4 (four) hours as needed for up to 18 doses for moderate pain (pain score 4-6)., Disp: 18 tablet, Rfl: 0   ibuprofen (ADVIL) 400 MG tablet, Take 400 mg by mouth every 6 (six) hours as needed., Disp: , Rfl:    levothyroxine  (SYNTHROID ) 75  MCG tablet, Take 37.5-75 mcg by mouth See admin instructions. Take 37.5 mcg by mouth in the morning before breakfast on Sunday and 75 mcg on Mon/Tues/Wed/Thurs/Fri/Sat, Disp: , Rfl:    LINZESS  290 MCG CAPS capsule, Take 290 mcg by mouth every morning., Disp: , Rfl:    omeprazole (PRILOSEC) 40 MG capsule, Take 40 mg by mouth daily as needed (heartburn)., Disp: , Rfl:    oxybutynin  (DITROPAN  XL) 10 MG 24 hr tablet, Take 1 tablet (10 mg total) by mouth at bedtime., Disp: 30 tablet, Rfl: 0   propranolol  (INDERAL ) 20 MG tablet, Take 1 tablet (20 mg total) by mouth 2 (two) times daily., Disp: 60 tablet, Rfl: 3   QUEtiapine  (SEROQUEL ) 100 MG tablet, Take 1 tablet (100 mg total) by mouth at bedtime., Disp: 30 tablet, Rfl: 3   senna (SENOKOT) 8.6 MG TABS tablet, Take 2 tablets by mouth in the morning., Disp: , Rfl:    tamsulosin  (FLOMAX ) 0.4 MG CAPS capsule, Take 1 capsule (0.4 mg total) by mouth daily., Disp: 30 capsule, Rfl: 1   temazepam  (RESTORIL ) 30 MG capsule, Take 1 capsule (30 mg total) by mouth at bedtime., Disp: 90 capsule, Rfl: 1 Medication Side Effects: none  Family Medical/ Social History: Changes? No  MENTAL HEALTH EXAM:  There were no vitals taken for this visit.There is no height or weight on file to calculate BMI.  General Appearance: Casual, Neat, and Well Groomed  Eye Contact:  Good  Speech:  Clear and Coherent  Volume:  Normal  Mood:  Anxious, Depressed, and Dysphoric  Affect:  Congruent  Thought Process:  Coherent  Orientation:  Full (Time, Place, and Person)  Thought Content: Logical   Suicidal Thoughts:  No  Homicidal Thoughts:  No  Memory:  WNL  Judgement:  Good  Insight:  Good  Psychomotor Activity:  Normal  Concentration:  Concentration: Good  Recall:  Good  Fund of Knowledge: Good  Language: Good  Assets:  Desire for Improvement  ADL's:  Intact  Cognition: WNL  Prognosis:  Good    DIAGNOSES:    ICD-10-CM   1. Generalized anxiety disorder  F41.1 propranolol   (INDERAL ) 20 MG tablet    busPIRone  (BUSPAR ) 30 MG tablet    FLUoxetine  (PROZAC ) 20 MG capsule    QUEtiapine  (SEROQUEL ) 100 MG tablet    2. Panic attack as reaction to stress  F41.0 propranolol  (INDERAL ) 20 MG tablet   F43.0     3. Major depressive disorder, recurrent episode, moderate (HCC)  F33.1 FLUoxetine  (PROZAC ) 20 MG capsule    4. Insomnia due to other mental disorder  F51.05 QUEtiapine  (SEROQUEL ) 100 MG tablet   F99 temazepam  (RESTORIL ) 30 MG capsule      Receiving Psychotherapy: yes   RECOMMENDATIONS:  Greater than 50% of 30 min face to face time with patient was spent on counseling and coordination of care. We talked about her current stability. She has previously been very happy with Inderal  but having some decline recently. She would like to consider increasing the medication if possible.  Still participating in psychotherapy with Marval Bunde.     We agreed to:  Increase Propranolol  20 mg three times daily as needed for anxiety or panic  To continue Buspar  to 30 mg twice daily, 8 hours between dosing.  Will continue Xanax  0.5 mg twice daily as needed for severe anxiety To continue Prozac  80 mg daily.  Patient is taking four 20 mg tablets To continue Seroquel  100  mg at bedtime.  To continue temazepam  30 mg capsule at bedtime for sleep.  Pt denies dx of prolonged QT interval. I discussed with her and she is not aware. To discuss with PCP next visit. We will report side effects or worsening symptoms promptly Provided emergency contact information We will follow-up in 3 months as required to continue medications. Discussed potential benefits, risk, and side effects of benzodiazepines to include potential risk of tolerance and dependence, as well as possible drowsiness.  Advised patient not to drive if experiencing drowsiness and to take lowest possible effective dose to minimize risk of dependence and tolerance.  Discussed potential metabolic side effects associated with  atypical antipsychotics, as well as potential risk for movement side effects. Advised pt to contact office if movement side effects occur.   Reviewed PDMP    Redell DELENA Pizza, NP

## 2024-01-31 ENCOUNTER — Ambulatory Visit: Admitting: Behavioral Health

## 2024-02-08 ENCOUNTER — Ambulatory Visit: Admitting: Psychiatry

## 2024-02-10 ENCOUNTER — Ambulatory Visit: Admitting: Psychiatry

## 2024-02-10 DIAGNOSIS — F411 Generalized anxiety disorder: Secondary | ICD-10-CM | POA: Diagnosis not present

## 2024-02-10 NOTE — Progress Notes (Signed)
 [facility] Counselor/Therapist Progress Note  Patient ID: Valerie [name], MRN: [id number],    Date: 02/10/2024  Time Spent: 55 minutes   Treatment Type: Individual Therapy  Reported Symptoms: anxiety, family stressors, aggravated and agitated re: son, depression not quite as bad and tends to hang onto negatives, frustration within family, some self-doubting, trying to set healthier boundaries, conflict continues with adult son age 65    Mental Status Exam:  Appearance:   Well Groomed     Behavior:  Appropriate, Sharing, Motivated, and much less resistance  Motor:  Normal  Speech/Language:   Clear and Coherent  Affect:  Anxiety and some depression  Mood:  anxious and depressed  Thought process:  goal directed  Thought content:    Rumination  Sensory/Perceptual disturbances:    WNL  Orientation:  oriented to person, place, time/date, situation, day of week, month of year, year, and stated date of Dec. 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  Is gaining more insight. Impulse control and judgment better. Does limit some interactions with people because of her trying to have better boundaries. Feels incomplete at times and explained this further in session today. Talking through issues re: her adult son A, and not letting him manipulate her as much as in the past. Communication very limited with son A. Feeling frustrated and not understanding how and  why son A is acting the way he is and it's very hurtful. Heavier emotions at times but still feeling she is managing better over all, yet knows I have issues to work on. Trying not to escalate situation with son and daughter-in-law. Also continues to work on her anger, hurt, and frustration especially related to family issues. Reports no physical acting-out at all within family or beyond. States she needs to continues her work on her anger, hurt, some resentment, healthy boundaries, communication, and her own self-esteem.     Interventions: Cognitive Behavioral Therapy, Solution-Oriented/Positive Psychology, and Ego-Supportive Long-term goal: Increase understanding of beliefs and messages that produce the worry and anxiety. Short-term goal: Reduce overall level, frequency, and intensity of the anxiety so that daily functioning is not impaired. Strategies: Patient to learn further about anxiety re: daily skills for managing it, and to use the skills effectively in everyday life.   Diagnosis:   ICD-10-CM   1. Generalized anxiety disorder  F41.1      Plan:    Patient working in session today further on her anxiety, frustration, distress, anger, boundaries with family/extended family, depression, all primarily related to personal and family issues.  Willingly acknowAddressing further issues in relationship with husband that are not helpful as they've been working on these trying to improve. Sharing and talking through multiple mixed messages within marriage at times. But I know husband loves me unconditionally but he does not recognize himself being this way.  To continues working with her goals and strategies on issues within marriage and goals that are  related more to her personally going forward.  Reminded her and continuing her work on healthier limits and boundaries, continuing her work on self-esteem and communication strategies that are helpful as worked on in sessions. Encouraged  patient to focus more on her strengths versus what she feels are her weaknesses or negatives that tend to lead her to self sabotage and self-doubt, pay more attention to how she says what she says, stay more focused in the present on what she can control or change, interrupt and decrease her overthinking and overanalyzing, refrain from assuming worst-case scenarios, understand that her anxious thoughts are just thoughts and are not necessarily going to play out in reality the way she might think, encouraged her to use more positive self-talk, work further on intentionally having clear boundaries within the family, she used to walk away from volatile situations, and recognize the strengths she can show as she continues her work with goal-directed behaviors to more intentionally focus on the ways that support her overall improved emotional health.  Valerie [name] is making progress and continues to show improved effort and work on her goals, trying to maintain her progress and build up on it, staying committed to working with goal-directed behaviors that are helping her move in a more healthier and positive direction as she heads into the future.  Goal review and progress/challenges noted with patient.  Next appt within 2 weeks.   Barnie Bunde, LCSW

## 2024-02-13 ENCOUNTER — Other Ambulatory Visit: Payer: Self-pay | Admitting: Behavioral Health

## 2024-02-13 DIAGNOSIS — F411 Generalized anxiety disorder: Secondary | ICD-10-CM

## 2024-02-13 DIAGNOSIS — F430 Acute stress reaction: Secondary | ICD-10-CM

## 2024-02-22 ENCOUNTER — Ambulatory Visit
Admission: RE | Admit: 2024-02-22 | Discharge: 2024-02-22 | Disposition: A | Discharge status: Home / Self Care | Source: Ambulatory Visit | Attending: Family Medicine | Admitting: Family Medicine

## 2024-02-22 ENCOUNTER — Ambulatory Visit: Admitting: Psychiatry

## 2024-02-22 DIAGNOSIS — F411 Generalized anxiety disorder: Secondary | ICD-10-CM

## 2024-02-22 DIAGNOSIS — Z1231 Encounter for screening mammogram for malignant neoplasm of breast: Secondary | ICD-10-CM

## 2024-02-22 NOTE — Progress Notes (Signed)
 "       [facility] Counselor/Therapist Progress Note  Patient ID: Valerie [name], MRN: [id number],    Date: 02/22/2024  Time Spent: 58 minutes   Treatment Type: Individual Therapy  Reported Symptoms: anxiety, some depression recently, family stressors but some better more recently and is being careful as she is not sure about motivation of at least 1 family member but also giving things a chance in case other family members being genuine in making changes. Feels uncertain about how Christmas visit will go but discussed in session today ways that she can have a positive/healthy impact on others as they interact and worry less about trying to make things happen in a certain way. Frustration within family, self-doubting. Less trust in some family interactions.    Mental Status Exam:  Appearance:   Casual and Neat     Behavior:  Appropriate, Sharing, and Motivated  Motor:  Normal  Speech/Language:   Clear and Coherent  Affect:  Depressed and anxious  Mood:  anxious and depressed  Thought process:  goal directed  Thought content:    Rumination  Sensory/Perceptual disturbances:    WNL  Orientation:  oriented to person, place, time/date, situation, day of week, month of year, year, and stated date of Dec. 23, 2025  Attention:  Fair  Concentration:  Good and Fair  Memory:  WNL  Fund of knowledge:   Good  Insight:    Fair  Judgment:   Good and Fair  Impulse Control:  Good and Fair   Risk Assessment: Danger to Self:  No Self-injurious Behavior: No Danger to Others: No Duty to Warn:no Physical Aggression / Violence:No  Access to Firearms a concern: No  Gang Involvement:No   Subjective:   Patient today in session working further on her anxiety, irritability, anger, depression, distress, all related to personal/family stressors and challenges. Recent panic attacks and was in contact with her med provider which was helpful for her. Reports no further panic attacks at this point but  has some fear it could happen while visiting family over the holidays. Needed session today to work on family worries especially re: her son A.  Talked through several situations that are concerning her right here at the holidays and just prior to she and her husband's visit to see family members out of state.  Was able to talk through and interrupt some of her impulsive thoughts and replace with some thoughts that can help her feel differently in the midst of family and respond differently, especially in her making more effort to not overly focused on the negatives and to not make assumptions about what others mean when they say certain things and if there is a doubt, be willing to ask the person so as to understand versus just staying stuck in her anger and assumptions.  (Not all details included in this note due to patient privacy needs).  She is showing more effort and motivation to try to make changes, however does admit it is difficult because she has thought and felt this way for a long time.  Acknowledged this with patient but also encouraging her to continue working on some of the changes that we are working on in her therapy.  Looking forward to the family trip in some ways but also some dread and worries about how things will go.  Coaching patient on ways to let go of things that may be said that trigger her and focus more on what she observes that is feeling  more positive, and also how she can and determine what she holds onto and what she is able to let go of, especially in interacting with other people.  Patient continuing to work on healthier boundaries, communication, dealing with hurt, working on anger and resentment, and trying to improve her own self-esteem.   Interventions: Cognitive Behavioral Therapy, Solution-Oriented/Positive Psychology, and Ego-Supportive Long-term goal: Increase understanding of beliefs and messages that produce the worry and anxiety. Short-term goal: Reduce  overall level, frequency, and intensity of the anxiety so that daily functioning is not impaired. Strategies: Patient to learn further about anxiety re: daily skills for managing it, and to use the skills effectively in everyday life.   Diagnosis:   ICD-10-CM   1. Generalized anxiety disorder  F41.1      Plan: Patient working intentionally today on some family relationship issues that have escalated prior to Christmas time.  Her symptoms include anxiety, distress, anger, poor boundaries, assuming worst-case scenarios, frustration, some depression, all related to family and interpersonal issues.  Did work diligently, with therapist support, to focus more intentionally on these issues and how she can actually be manipulated and allowed things to worsen versus having healthier boundaries and not doing things that add to the difficult family relationships.  Will continue working with her goals and strategies particularly on family and marital issues and goals.  Trying to set healthier limits and boundaries and be open to new and improved communication strategies to use particularly within the family. Therapist encouraged patient to be focusing more on her strengths as she continues to work on improving in certain areas that tend to lead her to self sabotage and self-doubt, pay more attention to how she says what she says, stay more focused in the present on what she can control or change, interrupt and decrease her overthinking and overanalyzing, refrain from assuming worst-case scenarios, understand that her anxious thoughts are just thoughts and are not necessarily going to play out in reality the way she might think, focus on using more positive self-talk, work further on intentionally having clear boundaries within the family, be able to walk away from volatile situations, and recognize the strengths she can show as she continues her work with goal-directed behaviors to focus more on the ways that  support her overall improved emotional health.  Valerie [name] has made progress and continues to work on her goals as she tries to maintain her progress and build up on it, remaining committed to working with goal-directed behaviors that are helping her move in a more healthy and positive direction going forward.  Goal review and progress/challenges noted with patient.  Next appointment within 2 weeks.   Barnie Bunde, LCSW                   "

## 2024-03-06 ENCOUNTER — Ambulatory Visit: Admitting: Psychiatry

## 2024-03-06 DIAGNOSIS — F411 Generalized anxiety disorder: Secondary | ICD-10-CM

## 2024-03-06 NOTE — Progress Notes (Signed)
 "       [facility] Counselor/Therapist Progress Note  Patient ID: Valerie [name], MRN: [id number],    Date: 03/06/2024  Time Spent: 55 minutes   Treatment Type: Individual Therapy  Reported Symptoms: anxiety, some depression but some better, family stressors, still trying to give things and changes a chance within the family, worrying, frustrations within family, self-doubting, her tension within family amplified, less trust within family, more hurt within family, procrastination   Mental Status Exam:  Appearance:   Casual and Neat     Behavior:  Appropriate, Sharing, and Motivated  Motor:  Normal  Speech/Language:   Clear and Coherent  Affect:  Anxious, some depression  Mood:  anxious and depressed  Thought process:  goal directed  Thought content:    Rumination  Sensory/Perceptual disturbances:    WNL  Orientation:  oriented to person, place, time/date, situation, day of week, month of year, year, and stated date of Jan. 5, 2026  Attention:  Good  Concentration:  Good  Memory:  WNL  Fund of knowledge:   Good  Insight:    Good and Fair  Judgment:   Good  Impulse Control:  Good and Fair   Risk Assessment: Danger to Self:  No Self-injurious Behavior: No Danger to Others: No Duty to Warn:no Physical Aggression / Violence:No  Access to Firearms a concern: No  Gang Involvement:No   Subjective:   Patient continues today working on her anxiety, irritability, depression, distress, and anger, all of which are related to personal and family stressors/challenges. Significant family issues (with husband's side of family) during holiday time and since that time, with some spilling over into her marital relationship. Issues with mother-in-law increased and time spent together was quite stressful as patient processed this in session today. Feeling it was all against me, and created more stress between patient and husband, which has continued. Patient not feeling valued and cared for  by husband. States I felt similar feelings when a child with father, and now I'm feeling it as an adult and losing belief in myself. Worked further on these sensitive feelings today and lack of healthy connections with other family and other adults. Lots of bitterness and patient focusing on this today, and wants to feel connected, capable, needed, especially with family.  No panic but some increase in depression and anxiety after family visit, as worked on today in session.  Continues to work on impulsive thoughts and be able to think situations through more effectively and not get stuck in anger nor anxiety cycles.  (Not all details included in this note due to patient privacy needs).  Working harder to be motivated and put forth more effort to change but not feeling very supported by others in the family.  Having a very hard time and letting go of things that she knows triggers her anxiety or depression but it is very hard for her to let go and not pick it back up.  Therapist continues to coach patient on specific behaviors that can help her and letting go of things that may be set or done, and particularly helping her realize what tends to happen when we do not let go of negative things in her lives.  Does seem to have some understanding that it definitely is a negative impact on her life to hold onto things from the past especially relating to anger, disappointment, and hurt.  Does continue to work on these in session and showing good motivation but very difficult to get  focused in a more positive direction and sustained that over more time.  To continue her work on resolving some anger and resentment along with hurt, have healthier boundaries, improve her own self-esteem, and healthier communication.   Interventions: Cognitive Behavioral Therapy, Solution-Oriented/Positive Psychology, and Ego-Supportive Long-term goal: Increase understanding of beliefs and messages that produce the worry and  anxiety. Short-term goal: Reduce overall level, frequency, and intensity of the anxiety so that daily functioning is not impaired. Strategies: Patient to learn further about anxiety re: daily skills for managing it, and to use the skills effectively in everyday life.   Diagnosis:   ICD-10-CM   1. Generalized anxiety disorder  F41.1      Plan:   Patient today working further on family relationship issues that did escalate previous to Christmas and has been a definite issue for her since Christmas.  Struggling with anxiety, distress, poor boundaries, assuming worst-case scenarios, frustration, anger, depression, all of which are related to patient's interpersonal and family issues.  Denies any thoughts to harm self or others.  Did work along with support by therapist in session today on these issues and agreed to work on some homework in between sessions that might help her remain more focused and on board with trying to move in a more positive direction and letting go of some things in the past.  To continue discussion next session on healthier limits and boundaries and improved communication skills/strategies particularly as she interacts with family. Continued encouragement of patient to focus more on recognizing her strengths as she continues to work on improving in certain areas that tend to lead her to self sabotage and self-doubt, pay more attention to how she says what she says, stay more focused in the present on what she can control or change, interrupt and decrease her overthinking and overanalyzing, refrain from assuming worst-case scenarios, understand that her anxious thoughts are just thoughts and are not necessarily going to play out in reality the way she might think, focus on using more positive self-talk, work further on intentionally having clear boundaries within the family, be able to walk away from volatile situations, and realize the strength she can show as she continues her work  with goal-directed behaviors focusing more on the ways that support her overall improved emotional health and wellbeing.  Valerie [name] has made progress and continues to work on her goals to help her maintain that progress and continue to build up on it, staying committed to working with goal-directed behaviors that are helping her move forward in a more positive and healthy direction.  Goal review and progress/challenges noted with patient.  Next appointment within 2 weeks.   Barnie Bunde, LCSW                   "

## 2024-03-21 ENCOUNTER — Ambulatory Visit: Admitting: Psychiatry

## 2024-03-21 DIAGNOSIS — F411 Generalized anxiety disorder: Secondary | ICD-10-CM

## 2024-03-21 NOTE — Progress Notes (Signed)
 "       [facility] Counselor/Therapist Progress Note  Patient ID: Valerie [name], MRN: [id number],    Date: 03/21/2024  Time Spent: 52 minutes   Treatment Type: Individual Therapy  Reported Symptoms:   anxiety and on edge, irritability, anger, depression  Mental Status Exam:  Appearance:   Casual and Neat     Behavior:  Appropriate, Sharing, and Motivated  Motor:  Normal  Speech/Language:   Clear and Coherent  Affect:  Depressed and anxiety  Mood:  anxious and depressed  Thought process:  goal directed  Thought content:    WNL  Sensory/Perceptual disturbances:    WNL  Orientation:  oriented to person, place, time/date, situation, day of week, month of year, year, and stated date of Jan. 20, 2026  Attention:  Fair  Concentration:  Good and Fair  Memory:  WNL  Fund of knowledge:   Good  Insight:    Fair  Judgment:   Good and Fair  Impulse Control:  Good and Fair   Risk Assessment: Danger to Self:  No Self-injurious Behavior: No Danger to Others: No Duty to Warn:no Physical Aggression / Violence:No  Access to Firearms a concern: No  Gang Involvement:No   Subjective: Patient in session today reporting feeling anxious and on edge, with some irritability, depression, and some anger. I really miss my mother. I don't like not knowing more about why she was so critical, judging, and play us  kids against each as adults. I've never been able to let things ride; there has to be some sort of explanation for me and there's a lot of things that I don't know and won't get an answer to and it's hard for me to Let Go.  Lots of old baggage and issues to let go of but needing to make that decision to let go and then follow through on it. Giving herself permission to let go of it.  Prior feelings in family that mother and others were against me, and now wanting to let go of those, so as to hopefully have better relationship with family and extended family now. Wanting to let go of  deceased mom (and hurt patient felt from her) so as to have better relationships with other family members now. Less panic today, some resentment. Feels she needs to think things through more now. Trying to resolve/let go of anger and hurt and move forward even though I can't get all the answers I want.  Want to stop letting others play on my emotions.  Being a people-pleaser is getting me no-where, yet patient struggling with sticking with her limits she sets for others in family. Worked at length on this in session today, although finding it hard to make changes.    Interventions: Cognitive Behavioral Therapy, Solution-Oriented/Positive Psychology, and Ego-Supportive   Diagnosis:   ICD-10-CM   1. Generalized anxiety disorder  F41.1      Plan: Patient today working further on recognizing pain/hurt/deception from the past, in order to live more healthier in the present with her family.  A big obstacle for patient is for her to say no when she needs to say no and not let others manipulate her into feeling guilty when she says no.  Discussed multiple examples of this within the family and patient continues to struggle with this, understandably in some ways, and trying to look at what is blocking her from setting better limits and not always feeling that she has to say yes to everything that other  people ask of her.  To continue working on her pain and hurt from the past and also trying to live out healthier ways of responding to people and being able to set limits with them as needed/wanted.  Goal review and progress/challenges noted with patient.  Next appt within 2 weeks.   Barnie Bunde, LCSW                   "

## 2024-03-23 ENCOUNTER — Ambulatory Visit: Admitting: Psychiatry
